# Patient Record
Sex: Female | Born: 1990 | Hispanic: No | Marital: Married | Smoking: Former smoker
Health system: Southern US, Community
[De-identification: ages and names within clinical notes are randomized; demographics above are authoritative.]

## PROBLEM LIST (undated history)

## (undated) ENCOUNTER — Inpatient Hospital Stay (HOSPITAL_COMMUNITY): Payer: Self-pay

## (undated) DIAGNOSIS — R55 Syncope and collapse: Secondary | ICD-10-CM

## (undated) DIAGNOSIS — B999 Unspecified infectious disease: Secondary | ICD-10-CM

## (undated) DIAGNOSIS — E559 Vitamin D deficiency, unspecified: Secondary | ICD-10-CM

## (undated) DIAGNOSIS — R519 Headache, unspecified: Secondary | ICD-10-CM

## (undated) DIAGNOSIS — I456 Pre-excitation syndrome: Secondary | ICD-10-CM

## (undated) DIAGNOSIS — A609 Anogenital herpesviral infection, unspecified: Secondary | ICD-10-CM

## (undated) DIAGNOSIS — L732 Hidradenitis suppurativa: Secondary | ICD-10-CM

## (undated) DIAGNOSIS — F419 Anxiety disorder, unspecified: Secondary | ICD-10-CM

## (undated) DIAGNOSIS — O139 Gestational [pregnancy-induced] hypertension without significant proteinuria, unspecified trimester: Secondary | ICD-10-CM

## (undated) DIAGNOSIS — R569 Unspecified convulsions: Secondary | ICD-10-CM

## (undated) DIAGNOSIS — R51 Headache: Secondary | ICD-10-CM

## (undated) HISTORY — PX: WISDOM TOOTH EXTRACTION: SHX21

## (undated) HISTORY — PX: TONSILLECTOMY: SUR1361

## (undated) HISTORY — DX: Gestational (pregnancy-induced) hypertension without significant proteinuria, unspecified trimester: O13.9

## (undated) HISTORY — DX: Vitamin D deficiency, unspecified: E55.9

## (undated) HISTORY — DX: Anogenital herpesviral infection, unspecified: A60.9

---
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
## 2008-01-22 ENCOUNTER — Ambulatory Visit: Payer: Self-pay | Admitting: Internal Medicine

## 2008-01-22 ENCOUNTER — Ambulatory Visit: Payer: Self-pay

## 2008-01-22 LAB — CONVERTED CEMR LAB
Calcium: 8.7 mg/dL (ref 8.4–10.5)
Creatinine, Ser: 0.6 mg/dL (ref 0.4–1.2)
Potassium: 3.5 meq/L (ref 3.5–5.1)
Sodium: 140 meq/L (ref 135–145)

## 2008-03-26 ENCOUNTER — Emergency Department (HOSPITAL_COMMUNITY): Admission: EM | Admit: 2008-03-26 | Discharge: 2008-03-26 | Payer: Self-pay | Admitting: Emergency Medicine

## 2008-04-14 ENCOUNTER — Ambulatory Visit: Payer: Self-pay | Admitting: Internal Medicine

## 2008-04-24 ENCOUNTER — Emergency Department (HOSPITAL_COMMUNITY): Admission: EM | Admit: 2008-04-24 | Discharge: 2008-04-25 | Payer: Self-pay | Admitting: Emergency Medicine

## 2008-09-20 ENCOUNTER — Emergency Department (HOSPITAL_COMMUNITY): Admission: EM | Admit: 2008-09-20 | Discharge: 2008-09-21 | Payer: Self-pay | Admitting: Emergency Medicine

## 2008-09-29 ENCOUNTER — Encounter (INDEPENDENT_AMBULATORY_CARE_PROVIDER_SITE_OTHER): Payer: Self-pay | Admitting: *Deleted

## 2009-01-21 DIAGNOSIS — I951 Orthostatic hypotension: Secondary | ICD-10-CM

## 2009-02-24 ENCOUNTER — Ambulatory Visit: Payer: Self-pay | Admitting: Internal Medicine

## 2009-03-23 ENCOUNTER — Telehealth (INDEPENDENT_AMBULATORY_CARE_PROVIDER_SITE_OTHER): Payer: Self-pay | Admitting: *Deleted

## 2009-05-19 ENCOUNTER — Emergency Department (HOSPITAL_COMMUNITY): Admission: EM | Admit: 2009-05-19 | Discharge: 2009-05-19 | Payer: Self-pay | Admitting: Emergency Medicine

## 2010-02-23 ENCOUNTER — Telehealth (INDEPENDENT_AMBULATORY_CARE_PROVIDER_SITE_OTHER): Payer: Self-pay | Admitting: *Deleted

## 2010-04-06 NOTE — Progress Notes (Signed)
   Faxed all Recent Cardiac over to [facility] in [place] to fax [phone] [facility]  March 23, 2009 8:23 AM

## 2010-04-08 NOTE — Progress Notes (Signed)
   Copied Pt's Records,Left Message on Cell [phone] for records pick-up. Crystal Newman  February 23, 2010 12:03 PM     Appended Document:  Pt picked up Records Today.

## 2010-05-30 LAB — CBC
MCHC: 32.7 g/dL (ref 30.0–36.0)
Platelets: 199 10*3/uL (ref 150–400)
RDW: 12.6 % (ref 11.5–15.5)

## 2010-05-30 LAB — DIFFERENTIAL
Basophils Relative: 1 % (ref 0–1)
Lymphs Abs: 2.6 10*3/uL (ref 0.7–4.0)
Monocytes Absolute: 0.7 10*3/uL (ref 0.1–1.0)
Monocytes Relative: 8 % (ref 3–12)
Neutro Abs: 5.3 10*3/uL (ref 1.7–7.7)
Neutrophils Relative %: 61 % (ref 43–77)

## 2010-05-30 LAB — URINALYSIS, ROUTINE W REFLEX MICROSCOPIC
Bilirubin Urine: NEGATIVE
Glucose, UA: NEGATIVE mg/dL
Hgb urine dipstick: NEGATIVE
Ketones, ur: NEGATIVE mg/dL
Protein, ur: NEGATIVE mg/dL
pH: 7.5 (ref 5.0–8.0)

## 2010-05-30 LAB — COMPREHENSIVE METABOLIC PANEL
ALT: 12 U/L (ref 0–35)
Albumin: 3.9 g/dL (ref 3.5–5.2)
Alkaline Phosphatase: 63 U/L (ref 39–117)
Calcium: 9.5 mg/dL (ref 8.4–10.5)
Glucose, Bld: 94 mg/dL (ref 70–99)
Potassium: 4 mEq/L (ref 3.5–5.1)
Sodium: 139 mEq/L (ref 135–145)
Total Protein: 6.6 g/dL (ref 6.0–8.3)

## 2010-05-30 LAB — POCT CARDIAC MARKERS
CKMB, poc: <1 ng/mL — ABNORMAL LOW (ref 1.0–8.0)
Myoglobin, poc: 17.9 ng/mL (ref 12–200)

## 2010-05-30 LAB — RAPID URINE DRUG SCREEN, HOSP PERFORMED
Amphetamines: NOT DETECTED
Benzodiazepines: NOT DETECTED
Cocaine: NOT DETECTED
Tetrahydrocannabinol: NOT DETECTED

## 2010-05-30 LAB — POCT PREGNANCY, URINE: Preg Test, Ur: NEGATIVE

## 2010-05-30 LAB — ETHANOL: Alcohol, Ethyl (B): <5 mg/dL (ref 0–10)

## 2010-06-13 LAB — CBC
Platelets: 186 10*3/uL (ref 150–400)
RBC: 3.93 MIL/uL (ref 3.87–5.11)
WBC: 9.9 10*3/uL (ref 4.0–10.5)

## 2010-06-13 LAB — RAPID URINE DRUG SCREEN, HOSP PERFORMED
Amphetamines: NOT DETECTED
Benzodiazepines: NOT DETECTED

## 2010-06-13 LAB — URINE CULTURE: Colony Count: >=100000

## 2010-06-13 LAB — ETHANOL: Alcohol, Ethyl (B): <5 mg/dL (ref 0–10)

## 2010-06-13 LAB — DIFFERENTIAL
Basophils Absolute: 0.1 10*3/uL (ref 0.0–0.1)
Basophils Relative: 1 % (ref 0–1)
Eosinophils Absolute: 0.1 10*3/uL (ref 0.0–0.7)
Eosinophils Relative: 1 % (ref 0–5)
Lymphocytes Relative: 27 % (ref 12–46)
Monocytes Absolute: 0.7 10*3/uL (ref 0.1–1.0)

## 2010-06-13 LAB — COMPREHENSIVE METABOLIC PANEL
ALT: 14 U/L (ref 0–35)
AST: 29 U/L (ref 0–37)
Albumin: 3.9 g/dL (ref 3.5–5.2)
Alkaline Phosphatase: 69 U/L (ref 39–117)
CO2: 25 mEq/L (ref 19–32)
Chloride: 108 mEq/L (ref 96–112)
GFR calc Af Amer: >60 mL/min (ref >60)
GFR calc non Af Amer: >60 mL/min (ref >60)
Potassium: 3.6 mEq/L (ref 3.5–5.1)
Sodium: 138 mEq/L (ref 135–145)
Total Bilirubin: 0.4 mg/dL (ref 0.3–1.2)

## 2010-06-13 LAB — URINE MICROSCOPIC-ADD ON

## 2010-06-13 LAB — POCT CARDIAC MARKERS
Myoglobin, poc: 59.8 ng/mL (ref 12–200)
Troponin i, poc: <0.05 ng/mL (ref 0.00–0.09)

## 2010-06-13 LAB — POCT PREGNANCY, URINE: Preg Test, Ur: NEGATIVE

## 2010-06-13 LAB — URINALYSIS, ROUTINE W REFLEX MICROSCOPIC
Bilirubin Urine: NEGATIVE
Ketones, ur: NEGATIVE mg/dL
Specific Gravity, Urine: 1.017 (ref 1.005–1.030)
Urobilinogen, UA: 0.2 mg/dL (ref 0.0–1.0)

## 2010-06-22 LAB — GLUCOSE, CAPILLARY

## 2010-06-22 LAB — POCT PREGNANCY, URINE: Preg Test, Ur: NEGATIVE

## 2010-07-20 NOTE — Discharge Summary (Signed)
[name], Crystal Newman             ACCOUNT NO.:  [id number]   MEDICAL RECORD NO.:  [id number]          PATIENT TYPE:  INP   LOCATION:  4738                         FACILITY:  [facility]   PHYSICIAN:  Duke Salvia, MD, FACCDATE OF BIRTH:  [birth year]   DATE OF ADMISSION:  04/09/2007  DATE OF DISCHARGE:  04/10/2007                               DISCHARGE SUMMARY   PRIMARY CARDIOLOGIST:  Duke Salvia, MD.   PRIMARY CARE PHYSICIAN:  None listed at this time.  The patient has been  seen by the [facility].   DISCHARGING DIAGNOSIS:  Neurocardiogenic syncope.   PAST MEDICAL HISTORY INCLUDES:  1. Asthma.  2. A history of syncopal episode in 2008.   HISTORY OF PRESENT ILLNESS:  A 20 year old Caucasian female who began  having syncopal episode approximately 2 years ago, preceded by  dizziness, occasional left arm tingling associated with fatigue and  occasional palpitations.  Presented to [facility] on day  of admission.  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  She went to the nurse's station and found to  have a low blood pressure, and was sent to [facility] for further  evaluation.   HOSPITAL COURSE:  Here, she was found to have an H&H of 13.3 and 39.1,  WBC 10.1, platelets 303,000.  Sodium 139, potassium 4, BUN and  creatinine 5 and 0.6 with a glucose of 85.  TSH pending at the time of  discharge.  Dr. Graciela Husbands has examined patient, and recommends increased  sodium and fluid intake, decrease caffeine, no alcohol intake.  The  patient is being discharged home with instructions to followup with Dr.  Graciela Husbands.  An outpatient appointment has been scheduled for February 23.  The patient has no restrictions in activity at this time including  driving per Dr. Graciela Husbands.   At the time of discharge patient afebrile, blood pressure 115/68, heart  rate 70.  Urine pregnancy negative.  A 12-lead EKG showed sinus brady  with sinus arrhythmia at a rate a 55 on day of discharge.   DURATION OF DISCHARGE ENCOUNTER:  Less than 30 minutes.      Dorian Pod, ACNP      Duke Salvia, MD, [facility]  Electronically Signed    MB/MEDQ  D:  04/10/2007  T:  04/11/2007  Job:  [id number]   cc:   [facility]

## 2010-07-20 NOTE — Assessment & Plan Note (Signed)
[facility]                         ELECTROPHYSIOLOGY OFFICE NOTE   NAME:Crystal Newman                    MRN:          [id number]  DATE:05/09/2007                            DOB:          Jun 07, 1990    Crystal Newman is seen following a recent hospitalization for recurrent  syncope. 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  She is much, much better on increased salt and water  intake.  She says that the test is upcoming, and I asked her what she  meant, and she meant her period which is scheduled later this week.   I should note that her urine is relatively clear.  She has eliminated  the caffeine.  She is having no orthostatic intolerance.   PHYSICAL EXAMINATION:  VITAL SIGNS:  Blood pressure is 122/63.  LUNGS:  Clear.  HEART:  Sounds were regular.  EXTREMITIES:  Without edema.   She is to continue on her current course, and will plan to see her again  in three months' time.     Duke Salvia, MD, [facility]  Electronically Signed    SCK/MedQ  DD: 05/09/2007  DT: 05/10/2007  Job #: [id number]   cc:   Silas Sacramento, M.D.

## 2010-07-20 NOTE — Assessment & Plan Note (Signed)
[facility]                         ELECTROPHYSIOLOGY OFFICE NOTE   NAME:Crystal Newman, Crystal Newman                    MRN:          [id number]  DATE:04/14/2008                            DOB:          1990/09/21    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  She has  very little prodrome on some occasions, but when she does it is quite  stereotypical.  Her recovery phase is notable for being flushed,  diaphoretic, extremely pale, residual orthostatic intolerance, and  profound residual fatigue.   These episodes have occurred,  A.  In the context of being in hospital.  B.  Early in the morning.  C.  In the setting of atypical chest pain and as noted, they  unfortunately have been not infrequently without much warning.   Her evaluation here today included urine sodiums taken in the fall where  her 24-hour total was 163 mEq/L.  We had tried her on Florinef  complicated by headaches and swelling.  We then put her on ProAmatine  over the last month she has been identified with having systolic and  diastolic hypertension in the range of 150 to 160/100.   She raises the question also as to whether Yasmin, which is currently  being recalled from the market could have any contribution.   On examination today, her blood pressure is 100/70, her weight was 168,  which is stable.  Her lungs were clear.  Her heart sounds were regular.  The abdomen was soft and extremities were without edema.   Electrocardiogram was normal with intervals of 0.11/0.07/0.36, the short  PR was stable.   IMPRESSION:  1. Recurrent syncope.  2. Dysautonomia.  3. Elevated blood pressure, likely as a consequence of ProAmatine.   We have discussed different treatment options including non-medicinal  support stockings as well as SSRI therapy.  She has elected to pursue  both with the idea that  medicinal therapy would be tried for a year and  if the symptoms abated, we try to wean off at that time.  We also  discussed the role of Prozac and she preferred Paxil especially given  the common anxiety.   We will see her again in 3 month's time.    Duke Salvia, MD, [facility]  Electronically Signed   SCK/MedQ  DD: 04/14/2008  DT: 04/15/2008  Job #: [id number]

## 2010-07-20 NOTE — Consult Note (Signed)
NAMEHILJA, KINTZEL             ACCOUNT NO.:  [id number]   MEDICAL RECORD NO.:  [id number]          PATIENT TYPE:  INP   LOCATION:  4738                         FACILITY:  [facility]   PHYSICIAN:  Duke Salvia, MD, FACCDATE OF BIRTH:  Feb 14, 1991   DATE OF CONSULTATION:  04/10/2007  DATE OF DISCHARGE:  04/10/2007                                 CONSULTATION   [name] seen at the request of Dr. Jens Som and Dr. Neva Seat for  recurrent syncope.   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  These episodes  are quite stereotypical.  They are accompanied by a sense that things  are moving away both in the terms of sight as well as hearing, and they  are accompanied by significant diaphoresis but without nausea or warmth.  They are associated with significant residual orthostatic intolerance,  profound residual fatigue, and they frequently occur around the time of  her menses.  She has also had episodes where she has lost consciousness  in the shower.  She has orthostatic tachycardia as well.   Her diet is modestly salt-deplete.  She drinks a modest amount of  caffeine.  Her fluid status is somewhat deplete as well.   Her episodes are accompanied by some palpitations, which she describes  as a fluttering, and then settling down.   On the episode that she had the other day at work, she was taken to the  nursing station where her heart rate was apparently relatively normal  but her blood pressure was recorded in the 60s.  She was described as  being white as a ghost.   She is all that fit, she did play softball in high school, but she has  no problems with exercise tolerance.   She takes no medications.   SOCIAL HISTORY:  She does not use cigarettes, alcohol or recreational  drugs.   She has no known drug allergies.   REVIEW OF SYSTEMS:  Noncontributory.   EXAMINATION:  She is a  healthy-appearing 20 year old girl.  Blood pressure was 105/63 with a pulse of 74.  HEENT:  No icterus or xanthoma.  The neck veins were flat.  Carotids were brisk and full bilaterally  without bruits.  BACK:  Without kyphosis or scoliosis.  LUNGS:  Clear.  Heart sounds were regular.  ABDOMEN:  Soft with active bowel sounds, without midline pulsation or  hepatomegaly.  Femoral pulses were not examined.  Distal pulses were intact.  There is no clubbing, cyanosis or edema.  NEUROLOGIC:  Grossly normal.  SKIN:  Warm and dry.   Electrocardiogram dated yesterday demonstrated sinus rhythm at 60 with  intervals 0.11/0.86/0.38.  There was a short PR interval but there was  no evidence of ventricular pre-excitation with a little tiny Q-wave-like  thing in lead V4, supportive of the idea of the absence of a delta wave.   IMPRESSION:  1. Recurrent syncope, almost certainly neurally-mediated.  2. Palpitations associated with #1.  3. Abnormal electrocardiogram manifested by short a PR  interval      consistent with Lown-Ganong-Levine syndrome.   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  I have  reviewed the above diagnosis with her parents and the patient  extensively, describing both the physiology, pathophysiology and the  therapeutic interventions, including salt, water and then potentially  things like Florinef, Proamatine and consideration of beta blocker/SSRI.  They understand these risks.  She is also admonished to be aware of her  prodrome and may become recumbent at that time, to be aware that there  is significant residual orthostatic intolerance and that while she is  standing, we described numerous maneuvers to try to augment venous  return.   Thank you for the consultation.      Duke Salvia, MD, [facility]  Electronically Signed     SCK/MEDQ  D:  04/10/2007  T:  04/11/2007  Job:  [id number]   cc:   Shade Flood, M.D.

## 2010-07-20 NOTE — H&P (Signed)
NAMELAUREEN, Crystal Newman             ACCOUNT NO.:  [id number]   MEDICAL RECORD NO.:  [id number]          PATIENT TYPE:  INP   LOCATION:  4738                         FACILITY:  [facility]   PHYSICIAN:  Crystal Frieze. Jens Som, MD, FACCDATE OF BIRTH:  [birth year]   DATE OF ADMISSION:  04/09/2007  DATE OF DISCHARGE:                              HISTORY & PHYSICAL   [name] is a 20 year old female with no prior cardiac history, who  is being admitted with syncope.  Note she typically does not have  dyspnea on exertion, orthopnea, PND, pedal edema, exertional chest pain.  She does state that for the past 1 year, she has noticed occasional  palpitations.  These are described as a brief flutter but there is no  associated symptoms.  Over the past 2 years, she has had multiple  syncopal episodes.  These occur suddenly without warning.  They can  occur either with exertion or at rest.  She states she will begin to  sweat, and her hands shake.  There is occasional tingling in the left  upper extremity.  She then passes out.  There is no associated chest  pain, nausea/vomiting, shortness of breath, seizure activity,  incontinence, loss of strength and sensation in extremities or  dysarthria.  She is out for 1 to 2 minutes and then wakes up.  She has  not injured herself.  She feels somewhat fatigued afterwards.  Note,  this is not occur after bowel movements or after urination.  She also  does denies any orthostatic symptoms.  She was seen in the emergency  room here in March 2008, for those episodes.  She also was seen in  Urgent Care earlier today for an episode.  She had a CBC checked that  showed a white blood cell count of 8.3, with a hemoglobin 11.9,  hematocrit 37.6.  Her platelet count was 266.  She had an  electrocardiogram as well, that showed a normal sinus rhythm at a rate  of 68.  The axis was normal.  There was sinus arrhythmia.  The QT was  not prolonged.  There was right atrial  enlargement.  The PR interval was  short.  Dr. Graciela Husbands was contacted and was to contact the patient for an  outpatient evaluation.  However, she had another episode and came to the  emergency room.  She presently is asymptomatic.  She has no known drug  allergies.  Her medications include birth control pills and iron.   PAST MEDICAL HISTORY:  There is no diabetes mellitus, hypertension,  hyperlipidemia.  She has had previous wisdom teeth removed, but there is  no other surgeries noted.   SOCIAL HISTORY:  She denies any alcohol, tobacco or drug use.   FAMILY HISTORY:  Is negative for coronary artery disease artery disease  or sudden death.   REVIEW OF SYSTEMS:  She denies any headaches or fevers, chills.  No  productive cough or hemoptysis.  There is no dysphagia or odynophagia.  She has had dark stools but is on iron.  There is no dysuria or  hematuria.  There is no  rash or seizure activity.  There is no  orthopnea, PND or pedal edema.  She is presently menstruating.  Remaining systems are negative.   PHYSICAL EXAMINATION:  VITAL SIGNS:  Her physical exam today shows a  blood pressure 112/83 and her temperature is 90.3.  Her pulse is 87.  In  the Urgent Care, orthostatics were obtained.  In the lying position, she  was 115/71 with a pulse 78.  In the standing position, she was 129/84,  with a pulse of 104.  She is well-developed and well-nourished, in no  acute distress.  SKIN:  Warm and dry.  She does not appear to be depressed, and there is  no peripheral clubbing.  BACK:  Normal.  HEENT:  Normal with normal eyelids.  Her neck is supple with a normal  upstroke bilaterally.  I can not appreciate bruits.  There is no jugular  distention and no thyromegaly noted.  CHEST:  Clear to auscultation, no expansion.  CARDIOVASCULAR:  Regular rhythm, normal S1 and S2.  There are no  murmurs, rubs or gallops noted.  There is no change with Valsalva.  ABDOMEN:  Nontender, nondistended,  positive bowel sounds, no  hepatosplenomegaly.  No mass appreciated.  There is no abdominal bruit.  She has 2+ femoral pulses bilaterally.  No bruits.  EXTREMITIES:  Show no edema that I can palpate.  No cords.  She has 2+  posterior tibial pulses bilaterally.  NEUROLOGIC:  Exam shows cranial nerves II-XII grossly intact.  There is  no loss of strength or sensation in extremities.  Her electrocardiogram  is described above.  She has had [facility] that was negative.   DIAGNOSES:  Syncope - etiology.  This is unclear at present.  Given that  she is describing episodes of diaphoresis prior to the event, this  certainly could be vasovagal.  However, electrocardiogram also shows a  short PR-interval with no delta wave.  We must also therefore consider  Lown-Gang-Levine syndrome.  Dr. Graciela Husbands is to evaluate the patient  tomorrow morning.  For now, she will be admitted to telemetry, and we  will follow her rhythm.  We will check an echocardiogram to quantify LV  function.  She will most likely need an outpatient CardioNet monitor, if  we cannot identify an issue in the hospital.  Note, we will check  baseline laboratories, electrolytes.  We will also check a TSH.      Crystal Frieze Jens Som, MD, [facility]  Electronically Signed     BSC/MEDQ  D:  04/09/2007  T:  04/10/2007  Job:  [id number]

## 2010-07-20 NOTE — Group Therapy Note (Signed)
Crystal Newman NO.:  [id number]   MEDICAL RECORD NO.:  [id number]          PATIENT TYPE:  WOC   LOCATION:  [facility]                   FACILITY:  [facility]   PHYSICIAN:  Elsie Lincoln, MD      DATE OF BIRTH:  Feb 23, 1991   DATE OF SERVICE:                                  CLINIC NOTE   CHIEF COMPLAINT:  Pap smear and birth control.   HISTORY OF PRESENT ILLNESS:  Crystal Newman is an 20 year old gravida 0 white  female who presents for her first pelvic examination and desires birth  control. She reports no problems with her period and is not currently  using condoms for birth control.   MENSTRUAL HISTORY:  Menarche at the age of 3. Her period has medium  flow and lasts about 5 days. She denies any menstrual cramps and no  bleeding between periods.   OBSTETRIC HISTORY:  The patient is G0.   GYNECOLOGIC HISTORY:  The patient has never had any pap smears.   SURGICAL HISTORY:  None.   MEDICAL HISTORY:  None.   MEDICATIONS:  None.   ALLERGIES:  None.   FAMILY HISTORY:  The patient's grandparents have a history of diabetes,  heart disease, and high blood pressure. The patient's grandmother and  grandfather have pancreatic and HEENT cancers. There is no other  significant diseases or health problems in her family.   PAST MEDICAL HISTORY:  Significant for asthma.   SOCIAL HISTORY:  The patient lives with her mother, father, and sister.  She does not smoke and does not drink alcohol. She has been sexually  active since the age of 78 and reports that she does not believe that  she is at high risk for any sexually transmitted diseases.   PHYSICAL EXAMINATION:  VITAL SIGNS:  Temperature 98.1, pulse 88, blood  pressure 116/81, weight 164.5.  GENERAL:  The patient is in no acute distress and is pleasant and  cooperative throughout the entire exam.  NECK:  Supple without mass. No thyromegaly and no lymphadenopathy.  HEART:  Regular rate and rhythm without murmur.  LUNGS:  Clear to auscultation bilaterally.  GENITOURINARY:  Normal female genitalia with pink vaginal mucosa and  rugae present. Cervical os is normal. Squamocolumnar junction is not  visualized. GC probe was obtained.   IMPRESSION:  This is an 20 year old here with request for birth control.   PLAN:  Pap smear was not performed today as it has not been three years  since the patient's first sexual encounter. We will plan to do a pap  smear in one year. Gonorrhea probe was obtained and the patient was  consented for HIV test. She was given a sample of Yazmin for birth  control after discussing at length the many birth control options. She  was also given a prescription with 11 refills and will return to clinic  in one year for a routine screening pap smear.     ______________________________  Sylvan Cheese, Dr.    ______________________________  Elsie Lincoln, MD    MJ/MEDQ  D:  09/07/2006  T:  09/08/2006  Job:  [id number]

## 2010-07-20 NOTE — Assessment & Plan Note (Signed)
[facility]                            CARDIOLOGY OFFICE NOTE   NAME:[name], MIELA DESJARDIN                    MRN:          [id number]  DATE:12/26/2007                            DOB:          Feb 01, 1991    Kathreen Cornfield comes in again having been seen last in March because of  symptoms of syncope that were consistent with an neurally mediated  syndrome.  She had been doing better at that time with salt and water  repletion.  She had an episode of syncope in July and then had a cluster  of episodes of syncope in September that occurred following resumption  of caffeinated beverages.  It also occurred concurrent with her period.   What happened next that prompted this reevaluation was that earlier in  October, she went to work, not feeling well.  This had persisted for  some time.  She had some sinus symptoms.  She had some GI symptoms, and  she was walking in and out, talking with the speech therapist, returned  with some towels and the next thing she knew, she was awakened on the  floor.  We unfortunately do not have records.  She was then seen in the  emergency room, and we have no records except to say that she was  thought to have a urinary tract infection and was treated with Bactrim.   She has continued to have recurrent shower syncope particularly with  hotter showers.  She is now taking warm.  She has had problems with  dizziness upon standing.   Her medications include iron and vitamin C.   PHYSICAL EXAMINATION:  GENERAL:  She is healthy appearing.  VITAL SIGNS:  Her blood pressure is 114/74 without significant  orthostatic change.  The pulse rate increased from 83 to 96.  LUNGS:  Clear.  HEART:  Sounds were regular.  There is no murmurs, rubs, or gallops.  ABDOMEN:  Soft.  EXTREMITIES:  Without edema.   Electrocardiogram dated today demonstrated sinus rhythm at 77 with  intervals of 0.11/0.08/0.36.   IMPRESSION:  1. 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  2. Short PR.  3. Most recent episode of syncope is associated with a heart rate of      150.  Data unavailable from EMS.   We will plan to try and get the records from EMS on December 13, 2007, of  transport from [facility] to [place].   We will plan to check her urine sodium excretion to see whether she  needs further sodium supplementation.  In the event that is okay, I will  consider beginning her on Florinef, although I have asked and her mother  to decide whether trying to proceed with aggressive nonmedicinal therapy  and avoiding provocative situations like diuresis and providing diuresis  and such would be preferable.   We will talk to her following the aforementioned testing.     Duke Salvia, MD, [facility]  Electronically Signed    SCK/MedQ  DD: 12/26/2007  DT: 12/27/2007  Job #: [id number]   cc:   Asencion Partridge  Neva Seat, M.D.

## 2010-11-26 LAB — CBC
Platelets: 303
RDW: 13.1

## 2010-11-26 LAB — MAGNESIUM: Magnesium: 2.1

## 2010-11-26 LAB — COMPREHENSIVE METABOLIC PANEL
ALT: 10
Alkaline Phosphatase: 59
BUN: 5 — ABNORMAL LOW
CO2: 30
GFR calc non Af Amer: >60
Glucose, Bld: 85
Potassium: 4
Sodium: 139
Total Bilirubin: 0.6

## 2010-11-26 LAB — TSH: TSH: 1.464

## 2010-12-06 LAB — CBC
HCT: 38.5
Hemoglobin: 13.2
MCHC: 34.2
MCV: 92.3
Platelets: 193
RBC: 4.17
RDW: 13.4
WBC: 7.2

## 2010-12-06 LAB — URINALYSIS, ROUTINE W REFLEX MICROSCOPIC
Bilirubin Urine: NEGATIVE
Glucose, UA: NEGATIVE
Hgb urine dipstick: NEGATIVE
Ketones, ur: NEGATIVE
Nitrite: NEGATIVE
Protein, ur: NEGATIVE
Specific Gravity, Urine: 1.014
Urobilinogen, UA: 0.2
pH: 6

## 2010-12-06 LAB — POCT I-STAT, CHEM 8
BUN: 5 — ABNORMAL LOW
Calcium, Ion: 1.11 — ABNORMAL LOW
Chloride: 103
Creatinine, Ser: 0.9
Glucose, Bld: 80
HCT: 40
Hemoglobin: 13.6
Potassium: 3.8
Sodium: 138
TCO2: 25

## 2010-12-06 LAB — URINE CULTURE
Colony Count: NO GROWTH
Culture: NO GROWTH

## 2010-12-06 LAB — DIFFERENTIAL
Basophils Absolute: 0
Basophils Relative: 0
Eosinophils Absolute: 0
Eosinophils Relative: 0
Lymphocytes Relative: 11 — ABNORMAL LOW
Lymphs Abs: 0.8
Monocytes Absolute: 0.8
Monocytes Relative: 12
Neutro Abs: 5.5
Neutrophils Relative %: 77

## 2010-12-06 LAB — URINE MICROSCOPIC-ADD ON

## 2010-12-06 LAB — POCT PREGNANCY, URINE: Preg Test, Ur: NEGATIVE

## 2011-02-12 ENCOUNTER — Emergency Department (HOSPITAL_COMMUNITY)
Admission: EM | Admit: 2011-02-12 | Discharge: 2011-02-12 | Disposition: A | Discharge status: Home / Self Care | Payer: No Typology Code available for payment source | Attending: Emergency Medicine | Admitting: Emergency Medicine

## 2011-02-12 ENCOUNTER — Emergency Department (HOSPITAL_COMMUNITY): Payer: No Typology Code available for payment source

## 2011-02-12 ENCOUNTER — Other Ambulatory Visit: Payer: Self-pay

## 2011-02-12 ENCOUNTER — Encounter: Payer: Self-pay | Admitting: Emergency Medicine

## 2011-02-12 DIAGNOSIS — R0789 Other chest pain: Secondary | ICD-10-CM

## 2011-02-12 DIAGNOSIS — R071 Chest pain on breathing: Secondary | ICD-10-CM | POA: Insufficient documentation

## 2011-02-12 DIAGNOSIS — F17200 Nicotine dependence, unspecified, uncomplicated: Secondary | ICD-10-CM | POA: Insufficient documentation

## 2011-02-12 DIAGNOSIS — R55 Syncope and collapse: Secondary | ICD-10-CM | POA: Insufficient documentation

## 2011-02-12 DIAGNOSIS — J45909 Unspecified asthma, uncomplicated: Secondary | ICD-10-CM | POA: Insufficient documentation

## 2011-02-12 HISTORY — DX: Anxiety disorder, unspecified: F41.9

## 2011-02-12 HISTORY — DX: Syncope and collapse: R55

## 2011-02-12 HISTORY — DX: Pre-excitation syndrome: I45.6

## 2011-02-12 HISTORY — DX: Unspecified convulsions: R56.9

## 2011-02-12 LAB — POCT I-STAT, CHEM 8
Calcium, Ion: 1.2 mmol/L (ref 1.12–1.32)
Creatinine, Ser: 0.8 mg/dL (ref 0.50–1.10)
Glucose, Bld: 159 mg/dL — ABNORMAL HIGH (ref 70–99)
Hemoglobin: 14.3 g/dL (ref 12.0–15.0)
TCO2: 26 mmol/L (ref 0–100)

## 2011-02-12 LAB — URINALYSIS, ROUTINE W REFLEX MICROSCOPIC
Glucose, UA: NEGATIVE mg/dL
Protein, ur: NEGATIVE mg/dL
Urobilinogen, UA: 0.2 mg/dL (ref 0.0–1.0)

## 2011-02-12 LAB — PREGNANCY, URINE: Preg Test, Ur: NEGATIVE

## 2011-02-12 MED ORDER — NAPROXEN 250 MG PO TABS: 250.0000 mg | ORAL_TABLET | Freq: Two times a day (BID) | ORAL | Status: AC

## 2011-02-12 NOTE — ED Notes (Signed)
Have informed phlebotomy of need to i-stat troponin and chem 8.  Lab staff will draw now.

## 2011-02-12 NOTE — ED Notes (Signed)
Patient arrived by POV w/ c/o left lower and lateral rib pain that has been going on for a while with what she describes as knots and that one feels like it has gotten larger and is causing further pain.  While in the waiting to be triaged, patient fell from standing position in an apparent seizure.  Upon arrival to the room, patient is NOT post ictal, able to answer all questions appropriately, moves all extremities w/o difficulty.  A/O x's 4.  No loss of bowel or bladder, NSR on bedside monitor, O2 sats 100% on room air.  Denies hitting head when she fell in the lobby.

## 2011-02-12 NOTE — ED Provider Notes (Signed)
History     CSN: [id number] Arrival date & time: 02/12/2011  3:42 PM   Chief Complaint  Patient presents with  . Chest Pain  . Seizures   HPI Pt was seen at 1715.  Per pt, c/o gradual onset and persistence of constant left lower anterior-lateral ribs "lumps" that she has had for the past 3-5 years.  States she "thinking one might have gotten bigger" and "hurts" for the past several weeks.  Denies palpitations, no SOB/cough, no fevers, no back pain, no abd pain, no N/V/D, no rash.   Past Medical History  Diagnosis Date  . Seizures   . Syncope     neurally-mediated  . Anxiety   . Asthma   . Short PR-normal QRS complex syndrome     Past Surgical History  Procedure Date  . Wisdom tooth extraction   . Tonsillectomy     History  Substance Use Topics  . Smoking status: Current Everyday Smoker -- 0.5 packs/day  . Smokeless tobacco: Not on file  . Alcohol Use: No    Review of Systems ROS: Statement: All systems negative except as marked or noted in the HPI; Constitutional: Negative for fever and chills. ; ; Eyes: Negative for eye pain, redness and discharge. ; ; ENMT: Negative for ear pain, hoarseness, nasal congestion, sinus pressure and sore throat. ; ; Cardiovascular: Negative for chest pain, palpitations, diaphoresis, dyspnea and peripheral edema. ; ; Respiratory: Negative for cough, wheezing and stridor. ; ; Gastrointestinal: Negative for nausea, vomiting, diarrhea, abdominal pain, blood in stool, hematemesis, jaundice and rectal bleeding. . ; ; Genitourinary: Negative for dysuria, flank pain and hematuria. ; ; Musculoskeletal: Negative for back pain and neck pain. Negative for swelling and trauma.; ; Skin: Negative for pruritus, rash, abrasions, blisters, bruising and +skin lesion.; ; Neuro: Negative for headache, lightheadedness and neck stiffness. Negative for weakness, altered level of consciousness , altered mental status, extremity weakness, paresthesias, involuntary movement,  seizure and +syncope.     Allergies  Review of patient's allergies indicates no known allergies.  Home Medications   Current Outpatient Rx  Name Route Sig Dispense Refill  . BUTALBITAL-APAP-CAFF-COD 50-325-40-30 MG PO CAPS Oral Take 1 capsule by mouth every 4 (four) hours as needed.      Marland Kitchen DOXYCYCLINE HYCLATE 100 MG PO CPEP Oral Take 100 mg by mouth daily.      Marland Kitchen ESTRADIOL 0.5 MG PO TABS Oral Take 0.5 mg by mouth daily.      Marland Kitchen FLUOXETINE HCL 20 MG PO CAPS Oral Take 20 mg by mouth daily.      . IBUPROFEN 800 MG PO TABS Oral Take 800 mg by mouth every 8 (eight) hours as needed.      Marland Kitchen MECLIZINE HCL 12.5 MG PO TABS Oral Take 12.5 mg by mouth 3 (three) times daily as needed.        BP 105/74  Pulse 86  Temp(Src) 98.5 F (36.9 C) (Oral)  Resp 20  SpO2 100%  Physical Exam 1720: Physical examination:  Nursing notes reviewed; Vital signs and O2 SAT reviewed;  Constitutional: Well developed, Well nourished, Well hydrated, In no acute distress; Head:  Normocephalic, atraumatic; Eyes: EOMI, PERRL, No scleral icterus; ENMT: Mouth and pharynx normal, Mucous membranes moist; Neck: Supple, Full range of motion, No lymphadenopathy; Cardiovascular: Regular rate and rhythm, No murmur, rub, or gallop; Respiratory: Breath sounds clear & equal bilaterally, No rales, rhonchi, wheezes, or rub, Normal respiratory effort/excursion; Chest: +TTP left lower ant-lat ribs.  No  rash, no abscess, no fluctuance.  Movement normal; Abdomen: Soft, Nontender, Nondistended, Normal bowel sounds; Genitourinary: No CVA tenderness; Spine:  No midline CS, TS, LS tenderness. Extremities: Pulses normal, No tenderness, No edema, No calf edema or asymmetry.; Neuro: AA&Ox3, Major CN grossly intact.  No gross focal motor or sensory deficits in extremities.; Skin: Color normal, Warm, Dry, no rash.    ED Course  Procedures   1725:  Apparently on arrival to Triage, pt "fell down" and "might have had a seizure" per Triage staff.  No  post-ictal confusion or lethargy, no incont of bowel or bladder, no apparent injuries.  Pt's boyfriend states that pt "usually extends her arm out before she falls and passes out so she doesn't hit her head."  EPIC and Echart review reveals pt has a several year hx of recurrent syncopal episodes with extensive workup.   MDM  MDM Reviewed: nursing note, vitals and previous chart Reviewed previous: ECG Interpretation: ECG, labs, CT scan and x-ray    Date: 02/12/2011  Rate: 95  Rhythm: normal sinus rhythm  QRS Axis: normal  Intervals: PR shortened  ST/T Wave abnormalities: normal  Conduction Disutrbances:none  Narrative Interpretation:   Old EKG Reviewed: unchanged; no significant changes from previous EKG dated 05/19/2009.  Results for orders placed during the hospital encounter of 02/12/11  URINALYSIS, ROUTINE W REFLEX MICROSCOPIC      Component Value Range   Color, Urine YELLOW  YELLOW    APPearance CLEAR  CLEAR    Specific Gravity, Urine 1.009  1.005 - 1.030    pH 6.0  5.0 - 8.0    Glucose, UA NEGATIVE  NEGATIVE (mg/dL)   Hgb urine dipstick NEGATIVE  NEGATIVE    Bilirubin Urine NEGATIVE  NEGATIVE    Ketones, ur NEGATIVE  NEGATIVE (mg/dL)   Protein, ur NEGATIVE  NEGATIVE (mg/dL)   Urobilinogen, UA 0.2  0.0 - 1.0 (mg/dL)   Nitrite NEGATIVE  NEGATIVE    Leukocytes, UA MODERATE (*) NEGATIVE   PREGNANCY, URINE      Component Value Range   Preg Test, Ur NEGATIVE    URINE MICROSCOPIC-ADD ON      Component Value Range   Squamous Epithelial / LPF RARE  RARE    WBC, UA 3-6  <3 (WBC/hpf)  POCT I-STAT, CHEM 8      Component Value Range   Sodium 142  135 - 145 (mEq/L)   Potassium 4.0  3.5 - 5.1 (mEq/L)   Chloride 105  96 - 112 (mEq/L)   BUN 7  6 - 23 (mg/dL)   Creatinine, Ser 4.09  0.50 - 1.10 (mg/dL)   Glucose, Bld 811 (*) 70 - 99 (mg/dL)   Calcium, Ion 9.14  1.12 - 1.32 (mmol/L)   TCO2 26  0 - 100 (mmol/L)   Hemoglobin 14.3  12.0 - 15.0 (g/dL)   HCT 78.2  95.6 - 21.3 (%)    POCT I-STAT TROPONIN I      Component Value Range   Troponin i, poc 0.00  0.00 - 0.08 (ng/mL)   Comment 3            Dg Ribs Unilateral W/chest Left  02/12/2011  *RADIOLOGY REPORT*  Clinical Data: Left-sided chest pain.  "Knot" on the left ribs.  LEFT RIBS AND CHEST - 3+ VIEW 02/12/2011:  Comparison: No prior left rib imaging.  Two-view chest x-ray 09/21/2008, 04/09/2007 [facility] and 12/13/2007 [facility].  Findings: No fractures identified involving the ribs.  No  intrinsic osseous abnormalities.  Cardiomediastinal silhouette unremarkable.  Lungs clear. Bronchovascular markings normal.  No pneumothorax.  No visible pleural effusions.  No significant interval change in the appearance of the chest.  IMPRESSION:  1.  Normal left ribs. 2.  No acute cardiopulmonary disease.  Original Report Authenticated By: Arnell Sieving, M.D.   Ct Head Wo Contrast  02/12/2011  *RADIOLOGY REPORT*  Clinical Data: Fall.  Seizure.  CT HEAD WITHOUT CONTRAST  Technique:  Contiguous axial images were obtained from the base of the skull through the vertex without contrast.  Comparison: CT head without contrast 10/01/2008.  Findings: No acute infarct, hemorrhage, or mass lesion is present. The ventricles are of normal size.  No significant extra-axial fluid collection is present.  The paranasal sinuses and mastoid air cells are clear.  The osseous skull is intact.  No significant extracranial soft tissue injury is evident.  IMPRESSION: Negative CT of the head.  Original Report Authenticated By: Jamesetta Orleans. MATTERN, M.D.    8:19 PM:  On exam, pt is palpating her ribs c/o "pain" and "lumps" that she has had for "a really really long time."  There is no rash, soft tissue crepitus, erythema, ecchymosis, fluctuance or visible abnormality to her skin.  Doubt acute infection or abscess.  Abd soft/NT, resps easy, VSS.  Syncope today was per pt's norm for the past several years.  Not hypoxic or tachycardic, no  SOB.  Doubt PE or ACS as cause for symptoms today.  Pt wants to go home now.  Dx testing d/w pt and family.  Questions answered.  Verb understanding, agreeable to d/c home with outpt f/u.       Christion Leonhard Allison Quarry, DO 02/14/11 1651

## 2011-02-12 NOTE — Discharge Instructions (Signed)
RESOURCE GUIDE  Dental Problems  Patients with Medicaid: [facility] [phone] [address]                                           [phone] Phone:  [phone]                                                  Phone:  [phone]  If unable to pay or uninsured, contact:  [facility] or [facility]. to become qualified for the adult dental clinic.  Chronic Pain Problems Contact [facility]  [phone] Patients need to be referred by their primary care doctor.  Insufficient Money for Medicine Contact [employer]:  call "211" or [facility] [phone].  No Primary Care Doctor Call [facility]  [phone] Other agencies that provide inexpensive medical care    [facility]  [phone]    [facility]  [phone]    [facility]  [phone]    [facility]  [phone]    [facility]  [phone]    [facility]  [phone]  Psychological Services [facility]  [phone] [facility] Services  [phone] [facility]   [phone] (emergency services [phone])  Substance Abuse Resources [facility]  [phone] [facility] [phone] [facility] [phone] Floydene Flock [phone] Residential & Outpatient Substance Abuse Program  [phone]  Abuse/Neglect [facility] Child Abuse Hotline 731-136-2890 [facility] Child Abuse Hotline (445) 855-8967 (After Hours)  Emergency Shelter [facility] [phone]  Maternity Homes Room at the [place] [facility] [place] 6303210845 [facility] [phone]  MRSA Hotline #:   (570)165-1801    [facility] Resources  [facility]     [employer]                          [facility] Dept. [address] [place]                       [address]      [address]  Blondell Reveal Phone:  [phone]                                   Phone:  [phone]                 Phone:  [phone]  [facility] Phone:  [phone]  [facility] Child Abuse Hotline [phone] (After Hours)   Take the prescription as directed.  Apply moist heat or ice to the area(s) of discomfort, for 15 minutes at a time, several times per day for the next few days.  Do not fall asleep on a heating or ice pack.  Call your regular medical doctor on Monday to schedule a follow up appointment this week.  Return to the Emergency Department immediately if worsening.

## 2011-07-21 ENCOUNTER — Encounter: Payer: Self-pay | Admitting: Obstetrics and Gynecology

## 2011-07-21 ENCOUNTER — Ambulatory Visit (INDEPENDENT_AMBULATORY_CARE_PROVIDER_SITE_OTHER): Payer: Self-pay | Admitting: Obstetrics and Gynecology

## 2011-07-21 VITALS — BP 100/70 | Temp 98.3°F | Resp 14 | Ht 67.0 in | Wt 163.0 lb

## 2011-07-21 DIAGNOSIS — E663 Overweight: Secondary | ICD-10-CM

## 2011-07-21 DIAGNOSIS — L732 Hidradenitis suppurativa: Secondary | ICD-10-CM | POA: Insufficient documentation

## 2011-07-21 DIAGNOSIS — N898 Other specified noninflammatory disorders of vagina: Secondary | ICD-10-CM

## 2011-07-21 DIAGNOSIS — R309 Painful micturition, unspecified: Secondary | ICD-10-CM

## 2011-07-21 LAB — POCT URINALYSIS DIPSTICK
Glucose, UA: NEGATIVE
Leukocytes, UA: NEGATIVE
Spec Grav, UA: 1.02

## 2011-07-21 LAB — POCT WET PREP (WET MOUNT)

## 2011-07-21 NOTE — Progress Notes (Addendum)
[name] is a 21 y.o. year old female,No obstetric history on file., who presents for a problem visit. The patient has a history of hidradenitis.  She has a history of psoriasis. She is taking doxycycline daily but her symptoms are getting worse.  Subjective:  The patient complains of recurrent "boils" between her legs that are painful.  She complains of a vaginal discharge.  Objective:  BP 100/70  Temp 98.3 F (36.8 C)  Resp 14  Ht 5\' 7"  (1.702 m)  Wt 163 lb (73.936 kg)  BMI 25.53 kg/m2   GI: soft, non-tender; bowel sounds normal; no masses,  no organomegaly  External genitalia: the patient has skin abscesses in various stages between the labia and the thighs.  Some are tender. Vaginal: discharge present Cervix: normal appearance Adnexa: normal bimanual exam Uterus: normal size shape and consistency  Wet prep: PH 5.0, with negative, clue cells negative  Urine analysis: Negative  Assessment:  Hidradenitis uncontrolled with doxycycline Vaginal discharge  Plan:  Management of hidradenitis was discussed.  I will make arrangements for the patient to be [facility] at [facility] because I am uncertain what more I can do given that she is taking doxycycline daily.  Return to office prn if symptoms worsen or fail to improve.   Leonard Schwartz M.D.  07/21/2011 7:18 PM

## 2011-07-28 ENCOUNTER — Telehealth: Payer: Self-pay | Admitting: Obstetrics and Gynecology

## 2011-07-28 NOTE — Telephone Encounter (Signed)
See telephone call. Dr. Eisha Chatterjee 

## 2012-03-14 ENCOUNTER — Telehealth: Payer: Self-pay | Admitting: Obstetrics and Gynecology

## 2012-03-14 NOTE — Telephone Encounter (Signed)
Tc to pt per telephone call. Appt sched 03/15/12 @ 9:45a with AVS for eval. Pt agrees.

## 2012-03-15 ENCOUNTER — Ambulatory Visit (INDEPENDENT_AMBULATORY_CARE_PROVIDER_SITE_OTHER): Payer: BC Managed Care – PPO | Admitting: Obstetrics and Gynecology

## 2012-03-15 ENCOUNTER — Encounter: Payer: Self-pay | Admitting: Obstetrics and Gynecology

## 2012-03-15 VITALS — BP 100/60 | Temp 98.8°F | Wt 167.0 lb

## 2012-03-15 DIAGNOSIS — L732 Hidradenitis suppurativa: Secondary | ICD-10-CM

## 2012-03-15 MED ORDER — MEDROXYPROGESTERONE ACETATE 150 MG/ML IM SUSP: 150.0000 mg | Freq: Once | INTRAMUSCULAR | Status: DC

## 2012-03-15 MED ORDER — CLINDAMYCIN HCL 300 MG PO CAPS: 300.0000 mg | ORAL_CAPSULE | Freq: Two times a day (BID) | ORAL | Status: AC

## 2012-03-15 MED ORDER — RIFAMPIN 300 MG PO CAPS: 600.0000 mg | ORAL_CAPSULE | Freq: Every day | ORAL | Status: DC

## 2012-03-15 NOTE — Progress Notes (Signed)
HISTORY OF PRESENT ILLNESS  [name] is a 22 y.o. year old female,No obstetric history on file., who presents for a problem visit. The patient has a history of hidradenitis.  The hormone therapy and doxycycline therapy have not helped.  She complains of a tender swollen lesion in the left groin.  She says she has multiple scars all over her body.  Subjective:  See above.  Objective:  BP 100/60  Temp 98.8 F (37.1 C)  Wt 167 lb (75.751 kg)  LMP 03/15/2012   General: no distress  External genitalia: 2 cm tender skin lesion consistent with hidradenitis.  No cellulitis.  Not pointing.  Assessment:  Hidradenitis refractory to doxycycline therapy.  Plan:  We'll began clindamycin 300 mg twice each day and rifampin 600 mg each day.  The patient will return in 4 weeks for reevaluation.  Leonard Schwartz M.D.  03/15/2012 9:48 PM

## 2012-04-11 ENCOUNTER — Encounter: Payer: Self-pay | Admitting: Obstetrics and Gynecology

## 2012-04-11 ENCOUNTER — Ambulatory Visit: Payer: BC Managed Care – PPO | Admitting: Obstetrics and Gynecology

## 2012-04-11 VITALS — BP 110/62 | Wt 168.0 lb

## 2012-04-11 DIAGNOSIS — N760 Acute vaginitis: Secondary | ICD-10-CM

## 2012-04-11 MED ORDER — CLOTRIMAZOLE-BETAMETHASONE 1-0.05 % EX CREA: TOPICAL_CREAM | Freq: Two times a day (BID) | CUTANEOUS | Status: DC

## 2012-04-11 NOTE — Progress Notes (Signed)
HISTORY OF PRESENT ILLNESS  [name] is a 22 y.o. year old female,No obstetric history on file., who presents for a problem visit. The patient has a history of hidradenitis.  She did not respond to doxycycline.  She was started on clindamycin and rifampin.  She feels much better.  Subjective:  The patient complains of heartburn associated with her medication. She reports some vaginal itching.  She used Lotrimin cream from a family member.  She requested a prescription of her own.  Objective:  BP 110/62  Wt 168 lb (76.204 kg)  LMP 03/15/2012   General: no distress GI: soft and nontender  External genitalia: skin eruptions have completely healed.  Assessment:  Improved hidradenitis on clindamycin and rifampin.  Vaginitis  Plan:  Continue clindamycin and rifampin. The patient says she can tolerate the side effects and she wants to continue her medication.  Lotrimin cream to the vulva.  Return to office in 4 month(s) for annual exam or as needed.Leonard Schwartz M.D.  04/11/2012 6:44 PM    Color: NONE  Odor: no Itching:yes Burning on the outside only when it would have been the time of her cycle. Pt was given cream by fiance's Aunt that relieved itching. Pt has box "Clotrimazole and Betamethasone" Thin:no Thick:no Fever:no Dyspareunia:no Hx PID:no HX STD:no Pelvic Pain:no Desires Gc/CT:no Desires HIV,RPR,HbsAG:no

## 2013-08-02 ENCOUNTER — Telehealth: Payer: Self-pay

## 2013-08-02 NOTE — Telephone Encounter (Signed)
Patient states she was in here Wednesday bc she was hurt at work.  She has been taking mobic twice daily as directed, but is still swollen around elbow and having a lot of pain.  Rt hand is also swollen.  She wants to know if she can have a different medicine.    [facility] [place].

## 2013-08-02 NOTE — Telephone Encounter (Signed)
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

## 2013-08-02 NOTE — Telephone Encounter (Signed)
Pt would like to speak to someone about her "condition", non specific; please call [phone]

## 2013-08-03 NOTE — Telephone Encounter (Signed)
Please call patient told her we will call in a prescription for ultram  and see if that helps with her pain. I also am in  the office today and would be happy to see her.

## 2013-08-03 NOTE — Telephone Encounter (Signed)
Dr. Cleta Alberts spoke with pt and rx was sent into [facility]

## 2013-10-03 ENCOUNTER — Other Ambulatory Visit: Payer: Self-pay | Admitting: Family Medicine

## 2013-11-06 ENCOUNTER — Other Ambulatory Visit: Payer: Self-pay | Admitting: Family Medicine

## 2014-01-15 ENCOUNTER — Inpatient Hospital Stay (HOSPITAL_COMMUNITY)
Admission: AD | Admit: 2014-01-15 | Discharge: 2014-01-16 | Disposition: A | Discharge status: Home / Self Care | Payer: PRIVATE HEALTH INSURANCE | Source: Ambulatory Visit | Attending: Obstetrics and Gynecology | Admitting: Obstetrics and Gynecology

## 2014-01-15 ENCOUNTER — Encounter (HOSPITAL_COMMUNITY): Payer: Self-pay | Admitting: *Deleted

## 2014-01-15 DIAGNOSIS — R509 Fever, unspecified: Secondary | ICD-10-CM | POA: Diagnosis not present

## 2014-01-15 DIAGNOSIS — R109 Unspecified abdominal pain: Secondary | ICD-10-CM | POA: Insufficient documentation

## 2014-01-15 DIAGNOSIS — O039 Complete or unspecified spontaneous abortion without complication: Secondary | ICD-10-CM | POA: Insufficient documentation

## 2014-01-15 DIAGNOSIS — F17210 Nicotine dependence, cigarettes, uncomplicated: Secondary | ICD-10-CM | POA: Diagnosis not present

## 2014-01-15 DIAGNOSIS — Z3A00 Weeks of gestation of pregnancy not specified: Secondary | ICD-10-CM | POA: Insufficient documentation

## 2014-01-15 LAB — CBC WITH DIFFERENTIAL/PLATELET
Basophils Absolute: 0 10*3/uL (ref 0.0–0.1)
Basophils Relative: 0 % (ref 0–1)
Eosinophils Absolute: 0.2 10*3/uL (ref 0.0–0.7)
Eosinophils Relative: 2 % (ref 0–5)
HEMATOCRIT: 38.1 % (ref 36.0–46.0)
HEMOGLOBIN: 12.6 g/dL (ref 12.0–15.0)
LYMPHS ABS: 3.1 10*3/uL (ref 0.7–4.0)
LYMPHS PCT: 30 % (ref 12–46)
MCH: 31.1 pg (ref 26.0–34.0)
MCHC: 33.1 g/dL (ref 30.0–36.0)
MCV: 94.1 fL (ref 78.0–100.0)
MONO ABS: 0.8 10*3/uL (ref 0.1–1.0)
Monocytes Relative: 7 % (ref 3–12)
Neutro Abs: 6.2 10*3/uL (ref 1.7–7.7)
Neutrophils Relative %: 61 % (ref 43–77)
PLATELETS: 229 10*3/uL (ref 150–400)
RBC: 4.05 MIL/uL (ref 3.87–5.11)
RDW: 13 % (ref 11.5–15.5)
WBC: 10.2 10*3/uL (ref 4.0–10.5)

## 2014-01-15 MED ORDER — KETOROLAC TROMETHAMINE 30 MG/ML IJ SOLN
30.0000 mg | Freq: Once | INTRAMUSCULAR | Status: AC
  Administered 2014-01-15: 30 mg via INTRAVENOUS
  Filled 2014-01-15: qty 1

## 2014-01-15 NOTE — MAU Note (Signed)
Seen at MD office on Monday, states she is having a miscarriage. Began having heavy bleeding this pm. Lower abd pain, fever.

## 2014-01-15 NOTE — MAU Provider Note (Signed)
History    Johney FrameShannon J [name] is a G3P0 with LMP of 12/08/2013 who presents with fever and abdominal pain secondary to miscarriage.  Patient reports she started spotting on Sunday and went to the office, Monday, for evaluation.  Today patient informed of decreasing hCG levels and of inevitable miscarriage.  Patient reports that she started bleeding at 4pm, with onset of RLQ pain at 630pm.  Patient reports taking tylenol with minimal relief at 830pm.  Patient also states that she has been wearing tampons and has been saturating a super plus every 45-60 minutes. However, upon arrival patient wearing pad, per provider instruction, and bleeding minimal to scant. Patient states that she also had an axillary temperature of 102.4 and "shakiness."  Patient denies malodor from vaginal, issues with constipation/diarrhea, or urination.  Patient does report some nausea, but no vomiting.  Patient states she recently had a boil drained and was taking antibiotics, but discontinued medication, per doctors orders, on Monday.   Patient Active Problem List   Diagnosis Date Noted  . Vaginitis and vulvovaginitis 04/11/2012  . Overweight 07/21/2011  . Hidradenitis 07/21/2011  . Vaginal Discharge 07/21/2011  . DYSAUTONOMIA 02/24/2009  . HYPOTENSION, ORTHOSTATIC 01/21/2009  . OTHER CONVULSIONS 01/21/2009    Chief Complaint  Patient presents with  . Vaginal Bleeding  . Abdominal Pain  . Fever   HPI  OB History    Gravida Para Term Preterm AB TAB SAB Ectopic Multiple Living   3               Past Medical History  Diagnosis Date  . Seizures   . Syncope     neurally-mediated  . Anxiety   . Asthma   . Short PR-normal QRS complex syndrome     Past Surgical History  Procedure Laterality Date  . Wisdom tooth extraction    . Tonsillectomy      History reviewed. No pertinent family history.  History  Substance Use Topics  . Smoking status: Current Every Day Smoker -- 0.50 packs/day  . Smokeless tobacco:  Never Used  . Alcohol Use: No    Allergies: No Known Allergies  Prescriptions prior to admission  Medication Sig Dispense Refill Last Dose  . butalbital-acetaminophen-caffeine (FIORICET WITH CODEINE) 50-325-40-30 MG per capsule Take 1 capsule by mouth every 4 (four) hours as needed.     Not Taking  . clotrimazole-betamethasone (LOTRISONE) cream Apply topically 2 (two) times daily. 30 g 2   . doxycycline (DORYX) 100 MG DR capsule Take 100 mg by mouth daily.     Not Taking  . estradiol (ESTRACE) 0.5 MG tablet Take 0.5 mg by mouth daily.     Not Taking  . FLUoxetine (PROZAC) 20 MG capsule Take 20 mg by mouth daily.     Not Taking  . ibuprofen (ADVIL,MOTRIN) 800 MG tablet Take 800 mg by mouth every 8 (eight) hours as needed.     Not Taking  . meclizine (ANTIVERT) 12.5 MG tablet Take 12.5 mg by mouth 3 (three) times daily as needed.     Not Taking  . medroxyPROGESTERone (DEPO-PROVERA) 150 MG/ML injection Inject 150 mg into the muscle every 3 (three) months.   Taking  . medroxyPROGESTERone (DEPO-PROVERA) 150 MG/ML injection Inject 1 mL (150 mg total) into the muscle once. 1 mL 1   . rifampin (RIFADIN) 300 MG capsule Take 2 capsules (600 mg total) by mouth daily. 60 capsule 5     Review of Systems  Constitutional: Positive for fever and  chills.  HENT: Negative.   Respiratory: Negative.   Cardiovascular: Negative.   Gastrointestinal: Positive for nausea and abdominal pain. Negative for vomiting, diarrhea and constipation.  Genitourinary: Negative.   Musculoskeletal: Negative.   Skin: Negative.   Neurological: Negative.     Also See HPI Above Physical Exam    Filed Vitals:   01/15/14 2249 01/16/14 0035 01/16/14 0040  BP: 140/72  114/74  Pulse: 102  76  Temp: 99 F (37.2 C) 98.3 F (36.8 C)   TempSrc: Oral Oral   Resp: 18  16  Height: 5\' 6"  (1.676 m)    Weight: 171 lb (77.565 kg)    SpO2: 99%      Results for orders placed or performed during the hospital encounter of 01/15/14  (from the past 24 hour(s))  CBC with Differential     Status: None   Collection Time: 01/15/14 11:23 PM  Result Value Ref Range   WBC 10.2 4.0 - 10.5 K/uL   RBC 4.05 3.87 - 5.11 MIL/uL   Hemoglobin 12.6 12.0 - 15.0 g/dL   HCT 16.1 09.6 - 04.5 %   MCV 94.1 78.0 - 100.0 fL   MCH 31.1 26.0 - 34.0 pg   MCHC 33.1 30.0 - 36.0 g/dL   RDW 40.9 81.1 - 91.4 %   Platelets 229 150 - 400 K/uL   Neutrophils Relative % 61 43 - 77 %   Neutro Abs 6.2 1.7 - 7.7 K/uL   Lymphocytes Relative 30 12 - 46 %   Lymphs Abs 3.1 0.7 - 4.0 K/uL   Monocytes Relative 7 3 - 12 %   Monocytes Absolute 0.8 0.1 - 1.0 K/uL   Eosinophils Relative 2 0 - 5 %   Eosinophils Absolute 0.2 0.0 - 0.7 K/uL   Basophils Relative 0 0 - 1 %   Basophils Absolute 0.0 0.0 - 0.1 K/uL   Physical Exam  Constitutional: She is oriented to person, place, and time. She appears well-developed and well-nourished.  Cardiovascular: Normal rate, regular rhythm and normal heart sounds.   Respiratory: Effort normal and breath sounds normal.  GI: Soft. Bowel sounds are normal. There is tenderness in the suprapubic area and left lower quadrant.  Genitourinary: Uterus is not tender. Cervix exhibits no motion tenderness and no friability. There is bleeding in the vagina.  Sterile Speculum Exam: -Vulva: No s/s of boil, drainage, or infection -Vaginal Vault: Small amt dark red blood noted -Cervix: No active bleeding from os-appears closed   Bimanual Exam: Closed/Long/Thick   Neurological: She is alert and oriented to person, place, and time.  Skin: Skin is warm and dry. There is pallor.  Psychiatric: She has a normal mood and affect.    ED Course  Assessment: 23 y.o. G3P0 SAB Abdominal Pain  Plan: -CBC with Differential -Toradol IM  -Currently afebrile, will recheck temperature   Follow Up (0025) -Pelvic Exam as charted -Labs WNL -Discussed vaginal hygiene during miscarriage including nothing in vagina until bleeding  subsides -Educated on who and when to call for gynecological related concerns -Observe abdominal pain and schedule appt if pain does not subside in 48 hours or report to MAU, at any time, if pain worsens -Follow up in office in 2 weeks for repeat hCG--Will send message to [facility] triage staff -Rx for Ibuprofen 800mg  Q8hrs prn -Okay to use percocet for pain -Encouraged to call if any questions or concerns arise prior to next scheduled office visit.  -Discharged to home in stable condtion  [facility],  Barre Aydelott LYNN CNM, MSN 01/15/2014 11:01 PM

## 2014-01-15 NOTE — MAU Note (Signed)
Pt states she has been having heavy bleeding since about 1600 this afternoon, pt states she went to sleep and woke with stabbing pain in her pelvic area.Pt states she has a nervous "jitter" also.

## 2014-01-16 MED ORDER — IBUPROFEN 800 MG PO TABS: 800.0000 mg | ORAL_TABLET | Freq: Three times a day (TID) | ORAL | Status: DC | PRN

## 2014-01-16 NOTE — Progress Notes (Signed)
Small amount of vaginal bleeding noted 

## 2014-01-16 NOTE — Discharge Instructions (Signed)
Miscarriage A miscarriage is the loss of an unborn baby (fetus) before the 20th week of pregnancy. The cause is often unknown.  HOME CARE  You may need to stay in bed (bed rest), or you may be able to do light activity. Go about activity as told by your doctor.  Have help at home.  Write down how many pads you use each day. Write down how soaked they are.  Do not use tampons. Do not wash out your vagina (douche) or have sex (intercourse) until your doctor approves.  Only take medicine as told by your doctor.  Do not take aspirin.  Keep all doctor visits as told.  If you or your partner have problems with grieving, talk to your doctor. You can also try counseling. Give yourself time to grieve before trying to get pregnant again. GET HELP RIGHT AWAY IF:  You have bad cramps or pain in your back or belly (abdomen).  You have a fever.  You pass large clumps of blood (clots) from your vagina that are walnut-sized or larger. Save the clumps for your doctor to see.  You pass large amounts of tissue from your vagina. Save the tissue for your doctor to see.  You have more bleeding.  You have thick, bad-smelling fluid (discharge) coming from the vagina.  You get lightheaded, weak, or you pass out (faint).  You have chills. MAKE SURE YOU:  Understand these instructions.  Will watch your condition.  Will get help right away if you are not doing well or get worse. Document Released: 05/16/2011 Document Reviewed: 05/16/2011 [facility] Patient Information 2015 [place]Care, [place]. This information is not intended to replace advice given to you by your health care provider. Make sure you discuss any questions you have with your health care provider. Recurrent Pregnancy Loss Recurrent pregnancy loss is the loss of three or more pregnancies before 20 weeks of gestation. Losing three or more pregnancies in a row is rare.  CAUSES  The most common cause of recurrent pregnancy loss is an abnormal  number of chromosomes in the developing baby (fetus). Chromosomes are the structures inside cells that hold all your genetic material. In most cases of recurrent pregnancy loss, a missing or extra chromosome keeps a baby from developing. It may not be possible to identify which chromosome is defective. Chromosome abnormalities can be inherited, but most of the time they occur by chance. Other possible causes of recurrent pregnancy loss include:  Being born with an abnormal womb structure (septate uterus).  Having noncancerous growths in your uterus (fibroids or polyps).  Having a disease that causes scarring in your uterus (Asherman syndrome).  Having a disease that causes your blood to clot (antiphospholipid syndrome).  Having a disease that increases bleeding (thrombophilia). RISK FACTORS The risk of recurrent pregnancy loss increases as your age increases. Other risk factors include:  Diabetes.  Thyroid disease.  Obesity.  Smoking.  Excessive alcohol or caffeine.  Recreational drug use. SIGNS AND SYMPTOMS  Vaginal bleeding.  Passing clots vaginally.  Abdominal pain or cramps.  Low back pain. DIAGNOSIS  Your health care provider can create an image of your womb using sound waves and a computer (ultrasound) to confirm your pregnancy by 5 or 6 weeks after you conceive. Ultrasound can also confirm a pregnancy loss. Your health care provider may do a complete physical exam, including your vagina and uterus (pelvic exam), to find possible causes of recurrent pregnancy loss.  Other tests may include:  Ultrasound imaging to  see if the structure of your uterus is normal or if you have polyps or fibroids.  Blood tests to see if you have a disease that causes recurrent pregnancy loss.  Blood tests to see if your blood clots normally.  Genetic testing of you and your partner. TREATMENT  In many cases, there is no specific treatment. Depending on the cause, possible treatments  include:  Having your eggs fertilized outside your uterus (in vitro fertilization). By doing this, a health care provider may be able to select eggs without chromosome abnormalities.  Taking a blood thinner to prevent clotting if you have antiphospholipid syndrome.  Having corrective surgery if you have an abnormality in your uterus. HOME CARE INSTRUCTIONS  Follow all your health care provider's home instructions carefully. These may include:  Do not smoke or use recreational drugs.  Do not drink alcohol if you are pregnant.  Do not put anything in your vagina or have sex for 2 weeks after you have had a miscarriage.  If you are Rh negative and have had a miscarriage, you may need to get a shot of Rho (D) immune globulin. Ask your doctor if you need this shot.  Use birth control if you do not want to get pregnant. You can get pregnant [redacted] weeks after a miscarriage.  Get support from friends and loved ones. Miscarriage can be a sad and stressful event. SEEK MEDICAL CARE IF:   You have light vaginal bleeding or spotting while pregnant.  You have been trying to get pregnant without success.  You are struggling with sadness or depression after a miscarriage. SEEK IMMEDIATE MEDICAL CARE IF:  You have heavy vaginal bleeding and abdominal cramps.  You have fever, chills, and severe abdominal pain. Document Released: 08/10/2007 Document Revised: 02/26/2013 Document Reviewed: 12/14/2012 [facility]ExitCare Patient Information 2015 [place]ExitCare, MarylandLLC. This information is not intended to replace advice given to you by your health care provider. Make sure you discuss any questions you have with your health care provider.

## 2014-03-07 NOTE — L&D Delivery Note (Signed)
Delivery Note At 11:19 AM a viable female, "Crystal Newman", was delivered via Vaginal, Spontaneous Delivery (Presentation: Right Occiput Anterior).  APGAR: , ; weight  .   Placenta status: Intact, Spontaneous.  Cord: 3 vessels with the following complications: None.  Cord pH: NA  Anesthesia: Epidural  Episiotomy:  None Lacerations: Right and left labial Suture Repair: 3.0 vicryl Est. Blood Loss (mL):  150 cc  Mom to postpartum.  Baby to Couplet care / Skin to Skin. Patient plans outpatient circumcision at peds or [facility]. Patient planning Micronor for contraception.  Erryn Dickison 02/11/2015, 12:30 PM

## 2014-06-03 LAB — OB RESULTS CONSOLE ANTIBODY SCREEN: ANTIBODY SCREEN: NEGATIVE

## 2014-06-03 LAB — OB RESULTS CONSOLE ABO/RH: RH TYPE: POSITIVE

## 2014-06-03 LAB — OB RESULTS CONSOLE GBS: GBS: POSITIVE

## 2014-06-03 LAB — OB RESULTS CONSOLE HIV ANTIBODY (ROUTINE TESTING): HIV: NONREACTIVE

## 2014-06-03 LAB — OB RESULTS CONSOLE RPR: RPR: NONREACTIVE

## 2014-06-03 LAB — OB RESULTS CONSOLE RUBELLA ANTIBODY, IGM: RUBELLA: NON-IMMUNE/NOT IMMUNE

## 2014-06-03 LAB — OB RESULTS CONSOLE HEPATITIS B SURFACE ANTIGEN: HEP B S AG: NEGATIVE

## 2014-06-05 DIAGNOSIS — O200 Threatened abortion: Secondary | ICD-10-CM | POA: Insufficient documentation

## 2014-06-15 ENCOUNTER — Encounter (HOSPITAL_COMMUNITY): Payer: Self-pay | Admitting: *Deleted

## 2014-06-15 ENCOUNTER — Inpatient Hospital Stay (HOSPITAL_COMMUNITY)
Admission: AD | Admit: 2014-06-15 | Discharge: 2014-06-15 | Disposition: A | Discharge status: Home / Self Care | Payer: Medicaid Other | Source: Ambulatory Visit | Attending: Obstetrics and Gynecology | Admitting: Obstetrics and Gynecology

## 2014-06-15 ENCOUNTER — Inpatient Hospital Stay (HOSPITAL_COMMUNITY): Payer: Medicaid Other

## 2014-06-15 DIAGNOSIS — Z3A01 Less than 8 weeks gestation of pregnancy: Secondary | ICD-10-CM | POA: Insufficient documentation

## 2014-06-15 DIAGNOSIS — B373 Candidiasis of vulva and vagina: Secondary | ICD-10-CM | POA: Diagnosis not present

## 2014-06-15 DIAGNOSIS — O98811 Other maternal infectious and parasitic diseases complicating pregnancy, first trimester: Secondary | ICD-10-CM | POA: Insufficient documentation

## 2014-06-15 DIAGNOSIS — R109 Unspecified abdominal pain: Secondary | ICD-10-CM | POA: Diagnosis present

## 2014-06-15 DIAGNOSIS — O99331 Smoking (tobacco) complicating pregnancy, first trimester: Secondary | ICD-10-CM | POA: Insufficient documentation

## 2014-06-15 DIAGNOSIS — O2341 Unspecified infection of urinary tract in pregnancy, first trimester: Secondary | ICD-10-CM | POA: Diagnosis not present

## 2014-06-15 DIAGNOSIS — R58 Hemorrhage, not elsewhere classified: Secondary | ICD-10-CM

## 2014-06-15 LAB — URINALYSIS, ROUTINE W REFLEX MICROSCOPIC
Bilirubin Urine: NEGATIVE
GLUCOSE, UA: NEGATIVE mg/dL
KETONES UR: NEGATIVE mg/dL
NITRITE: NEGATIVE
Protein, ur: NEGATIVE mg/dL
Specific Gravity, Urine: 1.025 (ref 1.005–1.030)
UROBILINOGEN UA: 0.2 mg/dL (ref 0.0–1.0)
pH: 5.5 (ref 5.0–8.0)

## 2014-06-15 LAB — CBC WITH DIFFERENTIAL/PLATELET
BASOS PCT: 0 % (ref 0–1)
Basophils Absolute: 0 10*3/uL (ref 0.0–0.1)
EOS ABS: 0.2 10*3/uL (ref 0.0–0.7)
Eosinophils Relative: 1 % (ref 0–5)
HEMATOCRIT: 38.4 % (ref 36.0–46.0)
HEMOGLOBIN: 12.8 g/dL (ref 12.0–15.0)
LYMPHS ABS: 2.9 10*3/uL (ref 0.7–4.0)
LYMPHS PCT: 23 % (ref 12–46)
MCH: 31 pg (ref 26.0–34.0)
MCHC: 33.3 g/dL (ref 30.0–36.0)
MCV: 93 fL (ref 78.0–100.0)
MONO ABS: 0.6 10*3/uL (ref 0.1–1.0)
MONOS PCT: 5 % (ref 3–12)
Neutro Abs: 8.6 10*3/uL — ABNORMAL HIGH (ref 1.7–7.7)
Neutrophils Relative %: 71 % (ref 43–77)
PLATELETS: 236 10*3/uL (ref 150–400)
RBC: 4.13 MIL/uL (ref 3.87–5.11)
RDW: 13.1 % (ref 11.5–15.5)
WBC: 12.3 10*3/uL — ABNORMAL HIGH (ref 4.0–10.5)

## 2014-06-15 LAB — WET PREP, GENITAL
Clue Cells Wet Prep HPF POC: NONE SEEN
TRICH WET PREP: NONE SEEN

## 2014-06-15 LAB — OB RESULTS CONSOLE GC/CHLAMYDIA: Gonorrhea: NEGATIVE

## 2014-06-15 LAB — HCG, QUANTITATIVE, PREGNANCY: hCG, Beta Chain, Quant, S: 24263 m[IU]/mL — ABNORMAL HIGH (ref <5)

## 2014-06-15 LAB — URINE MICROSCOPIC-ADD ON

## 2014-06-15 MED ORDER — FLUCONAZOLE 100 MG PO TABS: 150.0000 mg | ORAL_TABLET | Freq: Once | ORAL | Status: AC

## 2014-06-15 MED ORDER — NITROFURANTOIN MONOHYD MACRO 100 MG PO CAPS: 100.0000 mg | ORAL_CAPSULE | Freq: Two times a day (BID) | ORAL | Status: AC

## 2014-06-15 NOTE — MAU Note (Signed)
Pt states here for bleeding brown blood. Had u/s Thursday, all wnl. Intermittent cramping. Hx multiple miscarriages.

## 2014-06-15 NOTE — Discharge Instructions (Signed)
Vaginal Bleeding During Pregnancy, First Trimester  A small amount of bleeding (spotting) from the vagina is relatively common in early pregnancy. It usually stops on its own. Various things may cause bleeding or spotting in early pregnancy. Some bleeding may be related to the pregnancy, and some may not. In most cases, the bleeding is normal and is not a problem. However, bleeding can also be a sign of something serious. Be sure to tell your health care provider about any vaginal bleeding right away.  Some possible causes of vaginal bleeding during the first trimester include:  · Infection or inflammation of the cervix.  · Growths (polyps) on the cervix.  · Miscarriage or threatened miscarriage.  · Pregnancy tissue has developed outside of the uterus and in a fallopian tube (tubal pregnancy).  · Tiny cysts have developed in the uterus instead of pregnancy tissue (molar pregnancy).  HOME CARE INSTRUCTIONS   Watch your condition for any changes. The following actions may help to lessen any discomfort you are feeling:  · Follow your health care provider's instructions for limiting your activity. If your health care provider orders bed rest, you may need to stay in bed and only get up to use the bathroom. However, your health care provider may allow you to continue light activity.  · If needed, make plans for someone to help with your regular activities and responsibilities while you are on bed rest.  · Keep track of the number of pads you use each day, how often you change pads, and how soaked (saturated) they are. Write this down.  · Do not use tampons. Do not douche.  · Do not have sexual intercourse or orgasms until approved by your health care provider.  · If you pass any tissue from your vagina, save the tissue so you can show it to your health care provider.  · Only take over-the-counter or prescription medicines as directed by your health care provider.  · Do not take aspirin because it can make you  bleed.  · Keep all follow-up appointments as directed by your health care provider.  SEEK MEDICAL CARE IF:  · You have any vaginal bleeding during any part of your pregnancy.  · You have cramps or labor pains.  · You have a fever, not controlled by medicine.  SEEK IMMEDIATE MEDICAL CARE IF:   · You have severe cramps in your back or belly (abdomen).  · You pass large clots or tissue from your vagina.  · Your bleeding increases.  · You feel light-headed or weak, or you have fainting episodes.  · You have chills.  · You are leaking fluid or have a gush of fluid from your vagina.  · You pass out while having a bowel movement.  MAKE SURE YOU:  · Understand these instructions.  · Will watch your condition.  · Will get help right away if you are not doing well or get worse.  Document Released: 12/01/2004 Document Revised: 02/26/2013 Document Reviewed: 10/29/2012  ExitCare® Patient Information ©2015 ExitCare, LLC. This information is not intended to replace advice given to you by your health care provider. Make sure you discuss any questions you have with your health care provider.

## 2014-06-15 NOTE — MAU Provider Note (Signed)
Crystal Newman is a 24 y.o. G6P0050 at 6.0 weeks hx of loss x5 c/o cramping and brown discharge   History     Patient Active Problem List   Diagnosis Date Noted  . Vaginitis and vulvovaginitis 04/11/2012  . Overweight 07/21/2011  . Hidradenitis 07/21/2011  . Vaginal Discharge 07/21/2011  . DYSAUTONOMIA 02/24/2009  . HYPOTENSION, ORTHOSTATIC 01/21/2009  . OTHER CONVULSIONS 01/21/2009    Chief Complaint  Patient presents with  . Possible Pregnancy  . Vaginal Bleeding   HPI  OB History    Gravida Para Term Preterm AB TAB SAB Ectopic Multiple Living   6    5  5          Past Medical History  Diagnosis Date  . Seizures   . Syncope     neurally-mediated  . Anxiety   . Short PR-normal QRS complex syndrome   . Asthma     sports induced    Past Surgical History  Procedure Laterality Date  . Wisdom tooth extraction    . Tonsillectomy      No family history on file.  History  Substance Use Topics  . Smoking status: Current Every Day Smoker -- 0.50 packs/day  . Smokeless tobacco: Never Used  . Alcohol Use: No    Allergies: No Known Allergies  Prescriptions prior to admission  Medication Sig Dispense Refill Last Dose  . butalbital-acetaminophen-caffeine (FIORICET WITH CODEINE) 50-325-40-30 MG per capsule Take 1 capsule by mouth every 4 (four) hours as needed.     Not Taking  . FLUoxetine (PROZAC) 20 MG capsule Take 20 mg by mouth daily.     Not Taking  . ibuprofen (ADVIL,MOTRIN) 800 MG tablet Take 800 mg by mouth every 8 (eight) hours as needed.     Not Taking  . ibuprofen (ADVIL,MOTRIN) 800 MG tablet Take 1 tablet (800 mg total) by mouth every 8 (eight) hours as needed. 30 tablet 1   . rifampin (RIFADIN) 300 MG capsule Take 2 capsules (600 mg total) by mouth daily. 60 capsule 5     ROS See HPI above, all other systems are negative  Physical Exam   Blood pressure 135/84, pulse 106, temperature 98.2 F (36.8 C), resp. rate 18, height 5\' 6"  (1.676 m), weight  170 lb (77.111 kg).  Physical Exam Ext:  WNL ABD: Soft, non tender to palpation, no rebound or guarding SVE: C/T/H/ firm Thick slightly pink tinged creamy discharge   ED Course  Assessment: IUP at  6.0 weeks Membranes: intact   Plan: Labs: CBC, GC, CL, quant US for viability   Keyundra Fant, CNM, MSN 06/15/2014. 4:41 PM     MAU Addendum Note  Results for orders placed or performed during the hospital encounter of 06/15/14 (from the past 24 hour(s))  Urinalysis, Routine w reflex microscopic     Status: Abnormal   Collection Time: 06/15/14  4:20 PM  Result Value Ref Range   Color, Urine YELLOW YELLOW   APPearance CLEAR CLEAR   Specific Gravity, Urine 1.025 1.005 - 1.030   pH 5.5 5.0 - 8.0   Glucose, UA NEGATIVE NEGATIVE mg/dL   Hgb urine dipstick SMALL (A) NEGATIVE   Bilirubin Urine NEGATIVE NEGATIVE   Ketones, ur NEGATIVE NEGATIVE mg/dL   Protein, ur NEGATIVE NEGATIVE mg/dL   Urobilinogen, UA 0.2 0.0 - 1.0 mg/dL   Nitrite NEGATIVE NEGATIVE   Leukocytes, UA SMALL (A) NEGATIVE  CBC with Differential/Platelet     Status: Abnormal   Collection Time: 06/15/14  4:20 PM  Result Value Ref Range   WBC 12.3 (H) 4.0 - 10.5 K/uL   RBC 4.13 3.87 - 5.11 MIL/uL   Hemoglobin 12.8 12.0 - 15.0 g/dL   HCT 16.1 09.6 - 04.5 %   MCV 93.0 78.0 - 100.0 fL   MCH 31.0 26.0 - 34.0 pg   MCHC 33.3 30.0 - 36.0 g/dL   RDW 40.9 81.1 - 91.4 %   Platelets 236 150 - 400 K/uL   Neutrophils Relative % 71 43 - 77 %   Neutro Abs 8.6 (H) 1.7 - 7.7 K/uL   Lymphocytes Relative 23 12 - 46 %   Lymphs Abs 2.9 0.7 - 4.0 K/uL   Monocytes Relative 5 3 - 12 %   Monocytes Absolute 0.6 0.1 - 1.0 K/uL   Eosinophils Relative 1 0 - 5 %   Eosinophils Absolute 0.2 0.0 - 0.7 K/uL   Basophils Relative 0 0 - 1 %   Basophils Absolute 0.0 0.0 - 0.1 K/uL  Urine microscopic-add on     Status: Abnormal   Collection Time: 06/15/14  4:20 PM  Result Value Ref Range   Squamous Epithelial / LPF FEW (A) RARE   WBC, UA  0-2 <3 WBC/hpf   RBC / HPF 3-6 <3 RBC/hpf   Bacteria, UA RARE RARE  Wet prep, genital     Status: Abnormal   Collection Time: 06/15/14  4:55 PM  Result Value Ref Range   Yeast Wet Prep HPF POC MODERATE (A) NONE SEEN   Trich, Wet Prep NONE SEEN NONE SEEN   Clue Cells Wet Prep HPF POC NONE SEEN NONE SEEN   WBC, Wet Prep HPF POC TOO NUMEROUS TO COUNT (A) NONE SEEN    US IMPRESSION: Intrauterine gestational sac containing a yolk sac. Estimated gestational age by mean sac diameter of 6 weeks 1 day, however the gestational sac is irregular in shape. No fetal pole or cardiac activity yet visualized. Recommend follow-up quantitative B-HCG levels and follow-up US in 14 days to assess viability.   Assessment Yeast infection UTI   Plan: -Rx for Diflucan and Macrobid -Discussed need to follow up in office on 4/18 -Bleeding and PTL Precautions -Encouraged to call if any questions or concerns arise prior to next scheduled office visit.  -Discharged to home in stable condition    Othar Curto, CNM, MSN 06/15/2014. 5:22 PM

## 2014-06-16 LAB — GC/CHLAMYDIA PROBE AMP (~~LOC~~) NOT AT ARMC
Chlamydia: NEGATIVE
Neisseria Gonorrhea: NEGATIVE

## 2014-09-25 ENCOUNTER — Encounter (HOSPITAL_COMMUNITY): Payer: Self-pay

## 2014-09-25 ENCOUNTER — Inpatient Hospital Stay (HOSPITAL_COMMUNITY)
Admission: AD | Admit: 2014-09-25 | Discharge: 2014-09-25 | Disposition: A | Discharge status: Home / Self Care | Payer: Medicaid Other | Source: Ambulatory Visit | Attending: Obstetrics and Gynecology | Admitting: Obstetrics and Gynecology

## 2014-09-25 DIAGNOSIS — Z3A20 20 weeks gestation of pregnancy: Secondary | ICD-10-CM | POA: Diagnosis not present

## 2014-09-25 DIAGNOSIS — O2622 Pregnancy care for patient with recurrent pregnancy loss, second trimester: Secondary | ICD-10-CM | POA: Diagnosis not present

## 2014-09-25 DIAGNOSIS — O99342 Other mental disorders complicating pregnancy, second trimester: Secondary | ICD-10-CM | POA: Diagnosis not present

## 2014-09-25 DIAGNOSIS — O4692 Antepartum hemorrhage, unspecified, second trimester: Secondary | ICD-10-CM | POA: Diagnosis present

## 2014-09-25 DIAGNOSIS — O468X2 Other antepartum hemorrhage, second trimester: Secondary | ICD-10-CM | POA: Insufficient documentation

## 2014-09-25 DIAGNOSIS — F419 Anxiety disorder, unspecified: Secondary | ICD-10-CM | POA: Diagnosis not present

## 2014-09-25 LAB — CBC
HCT: 35.7 % — ABNORMAL LOW (ref 36.0–46.0)
HEMOGLOBIN: 11.9 g/dL — AB (ref 12.0–15.0)
MCH: 31.1 pg (ref 26.0–34.0)
MCHC: 33.3 g/dL (ref 30.0–36.0)
MCV: 93.2 fL (ref 78.0–100.0)
PLATELETS: 209 10*3/uL (ref 150–400)
RBC: 3.83 MIL/uL — ABNORMAL LOW (ref 3.87–5.11)
RDW: 13.4 % (ref 11.5–15.5)
WBC: 13.4 10*3/uL — AB (ref 4.0–10.5)

## 2014-09-25 NOTE — MAU Note (Signed)
Noticed bleeding since last night when wiping after using the bathroom having cramping in left lower abdomen.

## 2014-09-25 NOTE — MAU Provider Note (Signed)
DATE: 09/25/2014  Maternity Admissions Unit History and Physical Exam for an Obstetrics Patient  [name] is a 24 y.o. female, G6P0050, at [redacted]w[redacted]d gestation, who presents for evaluation of bleeding. She reports having vaginal spotting after intercourse yesterday. She denies rupture membranes. She has been followed at the [facility] of [facility].  Her pregnancy has been complicated by a history of prior miscarriages, and anxiety. Her blood type is O+. See history below.  OB History    Gravida Para Term Preterm AB TAB SAB Ectopic Multiple Living   Past Medical History  Diagnosis Date  . Syncope     neurally-mediated  . Anxiety   . Short PR-normal QRS complex syndrome   . Asthma     sports induced  . Seizures     childhood    Prescriptions prior to admission  Medication Sig Dispense Refill Last Dose  . acetaminophen (TYLENOL) 500 MG tablet Take 1,000 mg by mouth every 6 (six) hours as needed for mild pain.   Past Week at Unknown time  . Prenat w/o [id number] (CITRANATAL HARMONY PO) Take 2 each by mouth daily.   06/14/2014 at Unknown time  . Progesterone 50 MG SUPP Place 1 suppository vaginally at bedtime.   06/14/2014 at Unknown time    Past Surgical History  Procedure Laterality Date  . Wisdom tooth extraction    . Tonsillectomy      No Known Allergies  Family History: family history is not on file.  Social History:  reports that she has been smoking.  She has never used smokeless tobacco. She reports that she does not drink alcohol or use illicit drugs.  Review of systems: Normal pregnancy complaints.  Admission Physical Exam:    Body mass index is 29.29 kg/(m^2).  Blood pressure 112/74, pulse 106, temperature 98.4 F (36.9 C), temperature source Oral, resp. rate 18, height  (1.676 m), weight 181 lb 6.4 oz (82.283 kg).  HEENT:                 Within normal limits Chest:                    Clear Heart:                    Regular rate and rhythm Abdomen:             Gravid and nontender Extremities:          Grossly normal Neurologic exam: Grossly normal Pelvic exam:         Cervix: Closed and long. Speculum exam shows no blood in the vault. No lesions are noted on the cervix.  Stable fetal heart tones.   CBC    Component Value Date/Time   WBC 13.4* 09/25/2014 0913   RBC 3.83* 09/25/2014 0913   HGB 11.9* 09/25/2014 0913   HCT 35.7* 09/25/2014 0913   PLT 209 09/25/2014 0913   MCV 93.2 09/25/2014 0913   MCH 31.1 09/25/2014 0913   MCHC 33.3 09/25/2014 0913   RDW 13.4 09/25/2014 0913   LYMPHSABS 2.9 06/15/2014 1620   MONOABS 0.6 06/15/2014 1620   EOSABS 0.2 06/15/2014 1620   BASOSABS 0.0 06/15/2014 1620      Assessment:  [redacted]w[redacted]d gestation  Post coital bleeding  History of recurrent miscarriages  Anxiety  Plan:  Bleeding precautions  given.  Return to the office for her routine visit on 10/17/2014.  Call for questions or concerns.  Blood type is O+.   Janine Limbo 09/25/2014, 9:43 AM

## 2014-09-25 NOTE — Discharge Instructions (Signed)
Vaginal Bleeding During Pregnancy, Second Trimester °A small amount of bleeding (spotting) from the vagina is relatively common in pregnancy. It usually stops on its own. Various things can cause bleeding or spotting in pregnancy. Some bleeding may be related to the pregnancy, and some may not. Sometimes the bleeding is normal and is not a problem. However, bleeding can also be a sign of something serious. Be sure to tell your health care provider about any vaginal bleeding right away. °Some possible causes of vaginal bleeding during the second trimester include: °· Infection, inflammation, or growths on the cervix.   °· The placenta may be partially or completely covering the opening of the cervix inside the uterus (placenta previa). °· The placenta may have separated from the uterus (abruption of the placenta).   °· You may be having early (preterm) labor.   °· The cervix may not be strong enough to keep a baby inside the uterus (cervical insufficiency).   °· Tiny cysts may have developed in the uterus instead of pregnancy tissue (molar pregnancy).  °HOME CARE INSTRUCTIONS  °Watch your condition for any changes. The following actions may help to lessen any discomfort you are feeling: °· Follow your health care provider's instructions for limiting your activity. If your health care provider orders bed rest, you may need to stay in bed and only get up to use the bathroom. However, your health care provider may allow you to continue light activity. °· If needed, make plans for someone to help with your regular activities and responsibilities while you are on bed rest. °· Keep track of the number of pads you use each day, how often you change pads, and how soaked (saturated) they are. Write this down. °· Do not use tampons. Do not douche. °· Do not have sexual intercourse or orgasms until approved by your health care provider. °· If you pass any tissue from your vagina, save the tissue so you can show it to your  health care provider. °· Only take over-the-counter or prescription medicines as directed by your health care provider. °· Do not take aspirin because it can make you bleed. °· Do not exercise or perform any strenuous activities or heavy lifting without your health care provider's permission. °· Keep all follow-up appointments as directed by your health care provider. °SEEK MEDICAL CARE IF: °· You have any vaginal bleeding during any part of your pregnancy. °· You have cramps or labor pains. °· You have a fever, not controlled by medicine. °SEEK IMMEDIATE MEDICAL CARE IF:  °· You have severe cramps in your back or belly (abdomen). °· You have contractions. °· You have chills. °· You pass large clots or tissue from your vagina. °· Your bleeding increases. °· You feel light-headed or weak, or you have fainting episodes. °· You are leaking fluid or have a gush of fluid from your vagina. °MAKE SURE YOU: °· Understand these instructions. °· Will watch your condition. °· Will get help right away if you are not doing well or get worse. °Document Released: 12/01/2004 Document Revised: 02/26/2013 Document Reviewed: 10/29/2012 °ExitCare® Patient Information ©2015 ExitCare, LLC. This information is not intended to replace advice given to you by your health care provider. Make sure you discuss any questions you have with your health care provider. ° °

## 2014-11-09 ENCOUNTER — Encounter (HOSPITAL_COMMUNITY): Payer: Self-pay | Admitting: *Deleted

## 2014-11-09 ENCOUNTER — Inpatient Hospital Stay (HOSPITAL_COMMUNITY)
Admission: AD | Admit: 2014-11-09 | Discharge: 2014-11-09 | Disposition: A | Discharge status: Home / Self Care | Payer: Medicaid Other | Source: Ambulatory Visit | Attending: Obstetrics and Gynecology | Admitting: Obstetrics and Gynecology

## 2014-11-09 ENCOUNTER — Inpatient Hospital Stay (HOSPITAL_COMMUNITY): Payer: Medicaid Other

## 2014-11-09 DIAGNOSIS — O26892 Other specified pregnancy related conditions, second trimester: Secondary | ICD-10-CM | POA: Diagnosis not present

## 2014-11-09 DIAGNOSIS — Z3A27 27 weeks gestation of pregnancy: Secondary | ICD-10-CM | POA: Diagnosis not present

## 2014-11-09 DIAGNOSIS — O26899 Other specified pregnancy related conditions, unspecified trimester: Secondary | ICD-10-CM

## 2014-11-09 DIAGNOSIS — N898 Other specified noninflammatory disorders of vagina: Secondary | ICD-10-CM | POA: Diagnosis not present

## 2014-11-09 DIAGNOSIS — F17210 Nicotine dependence, cigarettes, uncomplicated: Secondary | ICD-10-CM | POA: Diagnosis not present

## 2014-11-09 DIAGNOSIS — R109 Unspecified abdominal pain: Secondary | ICD-10-CM | POA: Insufficient documentation

## 2014-11-09 LAB — URINALYSIS, ROUTINE W REFLEX MICROSCOPIC
Bilirubin Urine: NEGATIVE
Glucose, UA: NEGATIVE mg/dL
Hgb urine dipstick: NEGATIVE
KETONES UR: NEGATIVE mg/dL
Leukocytes, UA: NEGATIVE
NITRITE: NEGATIVE
PROTEIN: NEGATIVE mg/dL
Specific Gravity, Urine: <1.005 — ABNORMAL LOW (ref 1.005–1.030)
Urobilinogen, UA: 0.2 mg/dL (ref 0.0–1.0)
pH: 5.5 (ref 5.0–8.0)

## 2014-11-09 LAB — WET PREP, GENITAL
Clue Cells Wet Prep HPF POC: NONE SEEN
Trich, Wet Prep: NONE SEEN
YEAST WET PREP: NONE SEEN

## 2014-11-09 LAB — AMNISURE RUPTURE OF MEMBRANE (ROM) NOT AT ARMC: Amnisure ROM: NEGATIVE

## 2014-11-09 MED ORDER — LACTATED RINGERS IV BOLUS (SEPSIS)
1000.0000 mL | Freq: Once | INTRAVENOUS | Status: AC
  Administered 2014-11-09: 1000 mL via INTRAVENOUS

## 2014-11-09 MED ORDER — NIFEDIPINE 10 MG PO CAPS
10.0000 mg | ORAL_CAPSULE | ORAL | Status: DC | PRN
  Administered 2014-11-09 (×3): 10 mg via ORAL
  Filled 2014-11-09 (×3): qty 1

## 2014-11-09 MED ORDER — NIFEDIPINE 10 MG PO CAPS: 10.0000 mg | ORAL_CAPSULE | Freq: Four times a day (QID) | ORAL | Status: DC | PRN

## 2014-11-09 MED ORDER — HYDROXYPROGESTERONE CAPROATE 250 MG/ML IM OIL
250.0000 mg | TOPICAL_OIL | Freq: Once | INTRAMUSCULAR | Status: AC
  Administered 2014-11-09: 250 mg via INTRAMUSCULAR
  Filled 2014-11-09: qty 1

## 2014-11-09 NOTE — Progress Notes (Signed)
Venus Standard CNM notified of pt's admission and status. Will obtain u/s

## 2014-11-09 NOTE — Discharge Instructions (Signed)

## 2014-11-09 NOTE — MAU Note (Signed)
Having some abd cramping and had large amt white mucousy d/c.

## 2014-11-09 NOTE — Progress Notes (Signed)
Venus Standard CNM on unit. 

## 2014-11-09 NOTE — Progress Notes (Signed)
V. Standard CNM on unit and aware of ctx pattern

## 2014-11-09 NOTE — MAU Provider Note (Signed)
Crystal Newman is a 24 y.o. G6P0 at 27.0 weeks arrived to MAU after calling to report a discharge of mucus and back discomfort  this evening. She has a hx of SAB x5.  Pt report receiving 17P earlier in the pregnancy    History     Patient Active Problem List   Diagnosis Date Noted  . Postcoital bleeding 09/25/2014  . Vaginitis and vulvovaginitis 04/11/2012  . Overweight(278.02) 07/21/2011  . Hidradenitis 07/21/2011  . Vaginal discharge 07/21/2011  . DYSAUTONOMIA 02/24/2009  . HYPOTENSION, ORTHOSTATIC 01/21/2009  . OTHER CONVULSIONS 01/21/2009    Chief Complaint  Patient presents with  . Abdominal Cramping  . Vaginal Discharge   HPI  OB History    Gravida Para Term Preterm AB TAB SAB Ectopic Multiple Living   Past Medical History  Diagnosis Date  . Syncope     neurally-mediated  . Anxiety   . Short PR-normal QRS complex syndrome   . Asthma     sports induced  . Seizures     childhood    Past Surgical History  Procedure Laterality Date  . Wisdom tooth extraction    . Tonsillectomy      Family History  Problem Relation Age of Onset  . Hypertension Father   . Cancer Maternal Aunt   . Diabetes Maternal Grandmother   . Heart disease Maternal Grandmother   . Hypertension Maternal Grandmother   . Heart disease Maternal Grandfather   . Diabetes Paternal Grandmother   . Heart disease Paternal Grandmother   . Cancer Paternal Grandfather     Social History  Substance Use Topics  . Smoking status: Current Every Day Smoker -- 0.50 packs/day  . Smokeless tobacco: Never Used  . Alcohol Use: No    Allergies: Not on File  Prescriptions prior to admission  Medication Sig Dispense Refill Last Dose  . acetaminophen (TYLENOL) 500 MG tablet Take 1,000 mg by mouth every 6 (six) hours as needed for mild pain.   Past Month at Unknown time  . calcium carbonate (TUMS - DOSED IN MG ELEMENTAL CALCIUM) 500 MG chewable tablet Chew 1 tablet by mouth daily.    11/08/2014 at Unknown time  . cyclobenzaprine (FLEXERIL) 10 MG tablet Take 10 mg by mouth 3 (three) times daily as needed for muscle spasms.   Past Week at Unknown time  . Prenat w/o A-FeCbn-DSS-FA-DHA (CITRANATAL HARMONY PO) Take 2 each by mouth daily.   11/08/2014 at Unknown time  . simethicone (MYLICON) 125 MG chewable tablet Chew 125 mg by mouth every 6 (six) hours as needed for flatulence.   11/08/2014 at Unknown time    ROS See HPI above, all other systems are negative  Physical Exam   Blood pressure 127/78, pulse 104, temperature 97.8 F (36.6 C), resp. rate 18, height 5' 6.5" (1.689 m), weight 192 lb 12.8 oz (87.454 kg).  Physical Exam Ext:  WNL ABD: Soft, non tender to palpation, no rebound or guarding SVE: deferred   ED Course  Assessment: IUP at  27.0weeks Membranes: intact FHR: 145 CTX:  none   Plan: [place]  Addylin Manke, CNM, MSN 11/09/2014. 2:32 AM    Addendum 0300 MAU Addendum Note  Results for orders placed or performed during the hospital encounter of 11/09/14 (from the past 24 hour(s))  Urinalysis, Routine w reflex microscopic (not at [facility])     Status: Abnormal   Collection Time: 11/09/14  1:25 AM  Result Value Ref Range   Color, Urine YELLOW YELLOW   APPearance CLEAR CLEAR   Specific Gravity, Urine <1.005 (L) 1.005 - 1.030   pH 5.5 5.0 - 8.0   Glucose, UA NEGATIVE NEGATIVE mg/dL   Hgb urine dipstick NEGATIVE NEGATIVE   Bilirubin Urine NEGATIVE NEGATIVE   Ketones, ur NEGATIVE NEGATIVE mg/dL   Protein, ur NEGATIVE NEGATIVE mg/dL   Urobilinogen, UA 0.2 0.0 - 1.0 mg/dL   Nitrite NEGATIVE NEGATIVE   Leukocytes, UA NEGATIVE NEGATIVE  Amnisure rupture of membrane (rom)not at [facility]     Status: None   Collection Time: 11/09/14  3:10 AM  Result Value Ref Range   Amnisure ROM NEGATIVE   Wet prep, genital     Status: Abnormal   Collection Time: 11/09/14  3:10 AM  Result Value Ref Range   Yeast Wet Prep HPF POC NONE SEEN NONE SEEN   Trich, Wet Prep NONE SEEN  NONE SEEN   Clue Cells Wet Prep HPF POC NONE SEEN NONE SEEN   WBC, Wet Prep HPF POC FEW (A) NONE SEEN   Now ctx q 3, pt aware  A/P IVF bolus Procardia 17P Consulted with Dr. Normand Sloop [place] FHR 125, fluid normal, cervix 2.8cm.  Previous US on 10/17/14 at office reports the cervix L/T/C SSE a large amount of thick white mucusy discharge in the vault VE close GC/CT pending   Nayeliz Hipp, CNM, MSN 11/09/2014. 4:07 AM  MAU Addendum Note  CTX stopped   Plan: -Rx for procardia -Discussed need to follow up in office this week -Bleeding and PTL Precautions -Encouraged to call if any questions or concerns arise prior to next scheduled office visit.  -Discharged to home in stable condition Consulted with Dr. Maureen Ralphs Jatavious Peppard, CNM, MSN 11/09/2014. 6:04 AM

## 2014-11-09 NOTE — Progress Notes (Signed)
V. Standard CNM in to see pt and discuss d/c plan. Written and verbal d/c instructions given and understanding voiced.

## 2014-11-11 LAB — CERVICOVAGINAL ANCILLARY ONLY
CHLAMYDIA, DNA PROBE: NEGATIVE
NEISSERIA GONORRHEA: NEGATIVE

## 2014-11-17 ENCOUNTER — Inpatient Hospital Stay (HOSPITAL_COMMUNITY)
Admission: AD | Admit: 2014-11-17 | Discharge: 2014-11-17 | Disposition: A | Discharge status: Home / Self Care | Payer: Medicaid Other | Source: Ambulatory Visit | Attending: Obstetrics and Gynecology | Admitting: Obstetrics and Gynecology

## 2014-11-17 DIAGNOSIS — Z3A28 28 weeks gestation of pregnancy: Secondary | ICD-10-CM | POA: Diagnosis not present

## 2014-11-17 MED ORDER — BETAMETHASONE SOD PHOS & ACET 6 (3-3) MG/ML IJ SUSP
12.0000 mg | Freq: Once | INTRAMUSCULAR | Status: AC
  Administered 2014-11-17: 12 mg via INTRAMUSCULAR
  Filled 2014-11-17: qty 2

## 2014-11-17 NOTE — MAU Note (Signed)
Patient sent from the office at 28 weeks for a betamethasone injection. Fetus active. Denies pain, bleeding or discharge.

## 2014-11-18 ENCOUNTER — Encounter (HOSPITAL_COMMUNITY): Payer: Self-pay | Admitting: *Deleted

## 2014-11-18 ENCOUNTER — Inpatient Hospital Stay (HOSPITAL_COMMUNITY)
Admission: AD | Admit: 2014-11-18 | Discharge: 2014-11-18 | Disposition: A | Discharge status: Home / Self Care | Payer: Medicaid Other | Source: Ambulatory Visit | Attending: Obstetrics and Gynecology | Admitting: Obstetrics and Gynecology

## 2014-11-18 DIAGNOSIS — O2622 Pregnancy care for patient with recurrent pregnancy loss, second trimester: Secondary | ICD-10-CM | POA: Insufficient documentation

## 2014-11-18 DIAGNOSIS — Z3A27 27 weeks gestation of pregnancy: Secondary | ICD-10-CM | POA: Diagnosis not present

## 2014-11-18 DIAGNOSIS — O26879 Cervical shortening, unspecified trimester: Secondary | ICD-10-CM | POA: Diagnosis present

## 2014-11-18 DIAGNOSIS — Z283 Underimmunization status: Secondary | ICD-10-CM

## 2014-11-18 DIAGNOSIS — Z22330 Carrier of Group B streptococcus: Secondary | ICD-10-CM

## 2014-11-18 DIAGNOSIS — O99820 Streptococcus B carrier state complicating pregnancy: Secondary | ICD-10-CM | POA: Diagnosis not present

## 2014-11-18 DIAGNOSIS — O99332 Smoking (tobacco) complicating pregnancy, second trimester: Secondary | ICD-10-CM | POA: Diagnosis not present

## 2014-11-18 DIAGNOSIS — Z2839 Other underimmunization status: Secondary | ICD-10-CM

## 2014-11-18 DIAGNOSIS — O2620 Pregnancy care for patient with recurrent pregnancy loss, unspecified trimester: Secondary | ICD-10-CM | POA: Diagnosis present

## 2014-11-18 DIAGNOSIS — O26872 Cervical shortening, second trimester: Secondary | ICD-10-CM | POA: Insufficient documentation

## 2014-11-18 DIAGNOSIS — O9989 Other specified diseases and conditions complicating pregnancy, childbirth and the puerperium: Secondary | ICD-10-CM

## 2014-11-18 LAB — URINE MICROSCOPIC-ADD ON

## 2014-11-18 LAB — URINALYSIS, ROUTINE W REFLEX MICROSCOPIC
Bilirubin Urine: NEGATIVE
GLUCOSE, UA: NEGATIVE mg/dL
HGB URINE DIPSTICK: NEGATIVE
KETONES UR: 15 mg/dL — AB
Nitrite: NEGATIVE
PROTEIN: NEGATIVE mg/dL
Specific Gravity, Urine: 1.025 (ref 1.005–1.030)
UROBILINOGEN UA: 0.2 mg/dL (ref 0.0–1.0)
pH: 6 (ref 5.0–8.0)

## 2014-11-18 MED ORDER — BETAMETHASONE SOD PHOS & ACET 6 (3-3) MG/ML IJ SUSP
12.0000 mg | Freq: Once | INTRAMUSCULAR | Status: AC
  Administered 2014-11-18: 12 mg via INTRAMUSCULAR
  Filled 2014-11-18: qty 2

## 2014-11-18 MED ORDER — BETAMETHASONE SOD PHOS & ACET 6 (3-3) MG/ML IJ SUSP
12.5000 mg | Freq: Once | INTRAMUSCULAR | Status: DC
  Filled 2014-11-18: qty 2.1

## 2014-11-18 NOTE — Progress Notes (Signed)
Baby very active and difficult to monitor due to gestation and FM. Trans adjusted

## 2014-11-18 NOTE — Progress Notes (Signed)
Manfred Arch CNM in to discuss written and verbal d/c instructions and understanding voiced.Will start vag supp rather than P17 shots and pt understands

## 2014-11-18 NOTE — Progress Notes (Signed)
Manfred Arch CNM notified of pt's admission and status. Aware 2nd BTMZ given and u/a sent. Aware of ctx pattern. Will see pt

## 2014-11-18 NOTE — Progress Notes (Signed)
Cervix closed by spec exam

## 2014-11-18 NOTE — Discharge Instructions (Signed)
Cervical Insufficiency Cervical insufficiency is when the cervix is weak and starts to open (dilate) and thin (efface) before the pregnancy is at term and without labor starting. This is also called incompetent cervix. It can happen in the second or third trimester when the fetus starts putting pressure on the cervix. Cervical insufficiency can lead to a miscarriage, preterm premature rupture of the membranes (PPROM), or having the baby early (preterm birth).  RISK FACTORS You may be more likely to develop cervical insufficiency if:  You have a shorter cervix than normal.  Damage or injury occurred to your cervix from a past pregnancy or surgery.  You were born with a cervical defect.  You have had a procedure done on the cervix, such as cervical biopsy.  You have a history of cervical insufficiency.  You have a history of PPROM.  You have ended several past pregnancies through abortion.  You were exposed to the drug diethylstilbestrol (DES). SYMPTOMS Often times, women do not have any symptoms. Other times, women may only have mild symptoms that often start between week 14 through 20. The symptoms may last several days or weeks. These symptoms include:  Light spotting or bleeding from the vagina.  Pelvic pressure.  A change in vaginal discharge, such as discharge that changes from clear, white, or light yellow to pink or tan.  Back pain.  Abdominal pain or cramping. DIAGNOSIS Cervical insufficiency cannot be diagnosed before you become pregnant. Once you are pregnant, your health care provider will ask about your medical history and if you have had any problems in past pregnancies. Tell your health care provider about any procedures performed on your cervix or if you have a history of miscarriages or cervical insufficiency. If your health care provider thinks you are at high risk for cervical insufficiency or show signs of cervical insufficiency, he or she may:  Perform a pelvic  exam. This will check for:  The presence of the membranes (amniotic sac) coming out of the cervix.  Cervical abnormalities.  Cervical injuries.  The presence of contractions.  Perform an ultrasonography (commonly called ultrasound) to measure the length and thickness of the cervix. TREATMENT If you have been diagnosed with cervical insufficiency, your health care provider may recommend:  Limiting physical activity.  Bed rest at home or in the hospital.  Pelvic rest, which means no sexual intercourse or placing anything in the vagina.  Cerclage to sew the cervix closed and prevent it from opening too early. The stitches (sutures) are removed between weeks 36 and 38 to avoid problems during labor. Cerclage may be recommended during pregnancy if you have had a history of miscarriages or preterm births without a known cause. It may also be recommended if you have a short cervix that was identified by ultrasound or if your health care provider has found that your cervix has dilated before 24 weeks of pregnancy. Limiting physical activity and bed rest may or may not help prevent a preterm birth. WHEN SHOULD YOU SEEK IMMEDIATE MEDICAL CARE?  Seek immediate medical care if you show any symptoms of cervical insufficiency. You will need to go to the hospital to get checked immediately. Document Released: 02/21/2005 Document Revised: 07/08/2013 Document Reviewed: 04/30/2012 [facility] Patient Information 2015 [place], [place]. This information is not intended to replace advice given to you by your health care provider. Make sure you discuss any questions you have with your health care provider. Progesterone vaginal suppositories What is this medicine? PROGESTERONE (proe JES ter one) is  a female hormone. This medicine is used to treat infertility and to prevent miscarriage in women with a condition called corpus luteum insufficiency. This medicine may also be used to prevent preterm delivery in some  women. The suppositories are only available when compounded by your pharmacist. This medicine may be used for other purposes; ask your health care provider or pharmacist if you have questions. COMMON BRAND NAME(S): First - Progesterone VGS What should I tell my health care provider before I take this medicine? They need to know if you have any of these conditions: -blood vessel disease, blood clotting disorder, or suffered a stroke -breast, cervical or vaginal cancer -heart disease -kidney disease -liver disease -miscarriage or abortion -vaginal bleeding -an unusual or allergic reaction to progesterone, other hormones, medicines, foods, dyes, or preservatives -pregnant or trying to get pregnant -breast-feeding How should I use this medicine? This medicine is for vaginal use only. Do not take by mouth. Follow the directions on the prescription label. Wash your hands before and after use. Take your medicine at regular intervals. Do not take it more often than directed. Do not stop taking except on your doctor's advice. Talk to your pediatrician regarding the use of this medicine in children. Special care may be needed. Overdosage: If you think you have taken too much of this medicine contact a poison control center or emergency room at once. NOTE: This medicine is only for you. Do not share this medicine with others. What if I miss a dose? If you miss a dose, use it as soon as you can. If it is almost time for your next dose, use only that dose. Do not use double or extra doses. What may interact with this medicine? Interactions are not expected. Do not use any other vaginal products without asking your doctor or health care professional. This list may not describe all possible interactions. Give your health care provider a list of all the medicines, herbs, non-prescription drugs, or dietary supplements you use. Also tell them if you smoke, drink alcohol, or use illegal drugs. Some items may  interact with your medicine. What should I watch for while using this medicine? Visit your doctor or health care professional for regular checks on your progress. If your doctor or health care professional instructs you to use any other medicines in the vagina while you are using this medicine, you should separate the doses by at least 6 hours. You may notice a white discharge of medicine while using this medicine. This is normal. If it becomes bothersome, contact your doctor or health care professional. What side effects may I notice from receiving this medicine? Side effects that you should report to your doctor or health care professional as soon as possible: -abnormal vaginal bleeding -allergic reactions like skin rash, itching or hives, swelling of the face, lips, or tongue -breast tissue changes or discharge -changes in vision -chest pain -confusion -dark urine -general ill feeling or flu-like symptoms -light-colored stools -loss of appetite, nausea -pain, swelling, warmth in the leg -right upper belly pain -problems with balance, talking, walking -severe headaches -shortness of breath -sudden numbness or weakness of the face, arm or leg -unusually weak or tired -yellowing of the eyes or skin Side effects that usually do not require medical attention (report to your doctor or health care professional if they continue or are bothersome): -back pain -depressed mood or mood swings -increased appetite -fluid retention and swelling -nausea, vomiting -stomach cramps or bloating This list may not describe  all possible side effects. Call your doctor for medical advice about side effects. You may report side effects to FDA at 1-800-FDA-1088. Where should I keep my medicine? Keep out of the reach of children. Store in the refrigerator between 2 and 8 degrees C (36 and 46 degrees F). Do not freeze. Protect from light. Keep this medicine in the orginal container until ready to use. Throw  away any unused medicine after the expiration date. NOTE: This sheet is a summary. It may not cover all possible information. If you have questions about this medicine, talk to your doctor, pharmacist, or health care provider.  2015, [employer]. (2007-09-24 17:07:09)

## 2014-11-18 NOTE — MAU Provider Note (Signed)
  History   24 yo G6P0050 at 61 5/7 weeks presented for 2nd dose of betamethasone--received 1st dose yesterday in MAU s/p dx of short cervix on [place] in the office.  Cervical length 0.808 (had been 2.8 on 11/09/14).  Plan to restart vaginal progesterone--received single dose of 17P in the office.    Reports had some cramping last night after office visit and 1st dose betamethasone.  Denies leaking or bleeding, reports +FM.  Will monitor for contractions and evaluate cervix by speculum exam in MAU.  Patient Active Problem List   Diagnosis Date Noted  . Short cervix affecting pregnancy 11/18/2014  . 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 Rubella non-immune status, antepartum 11/18/2014  . 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 Postcoital bleeding 09/25/2014  . Overweight(278.02) 07/21/2011  . Hidradenitis 07/21/2011  . DYSAUTONOMIA 02/24/2009  . HYPOTENSION, ORTHOSTATIC 01/21/2009  . OTHER CONVULSIONS 01/21/2009    Chief Complaint  Patient presents with  . Abdominal Pain   HPI:  See above  OB History    Gravida Para Term Preterm AB TAB SAB Ectopic Multiple Living   Past Medical History  Diagnosis Date  . Syncope     neurally-mediated  . Anxiety   . Short PR-normal QRS complex syndrome   . Asthma     sports induced  . Seizures     childhood    Past Surgical History  Procedure Laterality Date  . Wisdom tooth extraction    . Tonsillectomy      Family History  Problem Relation Age of Onset  . Hypertension Father   . Cancer Maternal Aunt   . Diabetes Maternal Grandmother   . Heart disease Maternal Grandmother   . Hypertension Maternal Grandmother   . Heart disease Maternal Grandfather   . Diabetes Paternal Grandmother   . Heart disease Paternal Grandmother   . Cancer Paternal Grandfather     Social History  Substance Use Topics  . Smoking status: Current Every Day Smoker -- 0.50 packs/day  . Smokeless tobacco: Never  Used  . Alcohol Use: No    Allergies: No Known Allergies  No prescriptions prior to admission    ROS:  Occasional cramping last night, +FM. Physical Exam   Blood pressure 118/73, pulse 98, temperature 98.1 F (36.7 C), temperature source Oral, resp. rate 18.  Physical Exam  Chest clear Heart RRR without murmur Abd gravid, NT Pelvic--small amount creamy d/c in vault, cervix appears closed, approx 50%. Ext WNL  FHR Category 1 UCs none to palpation, much FM.  Frequent sharp changes in toco reading, but never associated with palpable contractions.  ED Course  Assessment: IUP at 27 5/7 weeks Short cervix--stable Hx recurrent SABs Completed betamethasone course  Plan: D/C home with instructions to continue bedrest, Procardia q 6 hours. Progesterone suppositories 25 mg per vagina BID (if significant irritation occurs, patient will notify [facility]). Keep scheduled appt at [facility] 9/20, call prn.   Nigel Bridgeman CNM, MSN 11/18/2014 5:41 PM

## 2014-11-18 NOTE — Progress Notes (Signed)
EFM strip shows abrupt spikes from toco about every min last few secs. Baby active --toco readjust and no uterine ctxs palpated

## 2014-11-18 NOTE — MAU Note (Addendum)
Pt here for 2nd betamethasone injection, also having cramping & pressure.  Hx of short cervix.  Denies bleeding, LOF, or discharge.  Pt taking procardia every 6 hours, dose due @ 1230.

## 2014-11-26 ENCOUNTER — Inpatient Hospital Stay (HOSPITAL_COMMUNITY)
Admission: AD | Admit: 2014-11-26 | Discharge: 2014-11-26 | Disposition: A | Discharge status: Home / Self Care | Payer: Medicaid Other | Source: Ambulatory Visit | Attending: Obstetrics and Gynecology | Admitting: Obstetrics and Gynecology

## 2014-11-26 ENCOUNTER — Encounter (HOSPITAL_COMMUNITY): Payer: Self-pay | Admitting: *Deleted

## 2014-11-26 DIAGNOSIS — Z3A28 28 weeks gestation of pregnancy: Secondary | ICD-10-CM | POA: Insufficient documentation

## 2014-11-26 DIAGNOSIS — F17210 Nicotine dependence, cigarettes, uncomplicated: Secondary | ICD-10-CM | POA: Diagnosis not present

## 2014-11-26 DIAGNOSIS — O99333 Smoking (tobacco) complicating pregnancy, third trimester: Secondary | ICD-10-CM | POA: Insufficient documentation

## 2014-11-26 DIAGNOSIS — O26873 Cervical shortening, third trimester: Secondary | ICD-10-CM | POA: Insufficient documentation

## 2014-11-26 LAB — URINALYSIS, ROUTINE W REFLEX MICROSCOPIC
Bilirubin Urine: NEGATIVE
Glucose, UA: NEGATIVE mg/dL
Hgb urine dipstick: NEGATIVE
Ketones, ur: NEGATIVE mg/dL
LEUKOCYTES UA: NEGATIVE
NITRITE: NEGATIVE
PH: 6 (ref 5.0–8.0)
Protein, ur: NEGATIVE mg/dL
UROBILINOGEN UA: 0.2 mg/dL (ref 0.0–1.0)

## 2014-11-26 LAB — WET PREP, GENITAL
CLUE CELLS WET PREP: NONE SEEN
TRICH WET PREP: NONE SEEN
YEAST WET PREP: NONE SEEN

## 2014-11-26 NOTE — MAU Provider Note (Signed)
History    Crystal Newman is a 24y.o. G6P0 at 28.6wks who presents, after phone call, for contractions.  Patient states she has been taking her procardia and had her last dose at 1830.  Patient reports contractions have improved, but that she is having lower abdominal pain and pressure.  Patient also reports vaginal discharge that has resulted in changing her undergarments x two.  However, patient is also taking vaginal progesterone twice daily.  Patient denies VB and reports active fetus.   Patient Active Problem List   Diagnosis Date Noted  . Short cervix affecting pregnancy 11/18/2014  . 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 Rubella non-immune status, antepartum 11/18/2014  . 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 Postcoital bleeding 09/25/2014  . Overweight(278.02) 07/21/2011  . Hidradenitis 07/21/2011  . DYSAUTONOMIA 02/24/2009  . HYPOTENSION, ORTHOSTATIC 01/21/2009  . OTHER CONVULSIONS 01/21/2009    No chief complaint on file.  HPI  OB History    Gravida Para Term Preterm AB TAB SAB Ectopic Multiple Living   Past Medical History  Diagnosis Date  . Syncope     neurally-mediated  . Anxiety   . Short PR-normal QRS complex syndrome   . Asthma     sports induced  . Seizures     childhood    Past Surgical History  Procedure Laterality Date  . Wisdom tooth extraction    . Tonsillectomy      Family History  Problem Relation Age of Onset  . Hypertension Father   . Cancer Maternal Aunt   . Diabetes Maternal Grandmother   . Heart disease Maternal Grandmother   . Hypertension Maternal Grandmother   . Heart disease Maternal Grandfather   . Diabetes Paternal Grandmother   . Heart disease Paternal Grandmother   . Cancer Paternal Grandfather     Social History  Substance Use Topics  . Smoking status: Current Every Day Smoker -- 0.50 packs/day  . Smokeless tobacco: Never Used  . Alcohol Use: No    Allergies: No Known  Allergies  Prescriptions prior to admission  Medication Sig Dispense Refill Last Dose  . acetaminophen (TYLENOL) 500 MG tablet Take 1,000 mg by mouth every 6 (six) hours as needed for mild pain.   Past Month at Unknown time  . calcium carbonate (TUMS - DOSED IN MG ELEMENTAL CALCIUM) 500 MG chewable tablet Chew 1 tablet by mouth daily as needed for indigestion or heartburn.    Past Month at Unknown time  . cyclobenzaprine (FLEXERIL) 10 MG tablet Take 10 mg by mouth 3 (three) times daily as needed for muscle spasms.   Past Month at Unknown time  . NIFEdipine (PROCARDIA) 10 MG capsule Take 1 capsule (10 mg total) by mouth every 6 (six) hours as needed (x 3 doses). (Patient taking differently: Take 10 mg by mouth every 6 (six) hours as needed (for contractions). ) 30 capsule 1 11/18/2014 at 1300  . Prenat w/o A-FeCbn-DSS-FA-DHA (CITRANATAL HARMONY PO) Take 2 each by mouth daily.   Past Week at Unknown time  . promethazine (PHENERGAN) 25 MG tablet Take 25 mg by mouth every 6 (six) hours as needed for nausea or vomiting.   Past Week at Unknown time    ROS  See HPI Above Physical Exam   Blood pressure 123/78, pulse 118, temperature 99.2 F (37.3 C), temperature source Oral, resp. rate 16, height  (1.702 m), weight 85.73  kg (189 lb), SpO2 99 %.  No results found for this or any previous visit (from the past 24 hour(s)).  Physical Exam  Vitals reviewed. Constitutional: She is oriented to person, place, and time. She appears well-developed and well-nourished. No distress.  HENT:  Head: Normocephalic and atraumatic.  Eyes: EOM are normal. Pupils are equal, round, and reactive to light.  Neck: Normal range of motion.  Cardiovascular: Normal rate and regular rhythm.   Respiratory: Effort normal.  GI: Soft.  Genitourinary: Uterus is enlarged. Cervix exhibits no motion tenderness. Vaginal discharge found.  Sterile Speculum Exam: -Vulva: Thin white discharge noted at introitus -Vaginal Vault:  Thick white discharged noted in vault -wet prep collected -Cervix: Appears closed, no discharge from os Bimanual Exam: Closed/Thick/Ballotable  Musculoskeletal: Normal range of motion. She exhibits no edema.  Neurological: She is alert and oriented to person, place, and time.  Skin: Skin is warm and dry.  Psychiatric: She has a normal mood and affect. Her behavior is normal.     FHR:145 bpm, Mod Var, -Decels, +Accels UC: Irritability noted ED Course  Assessment: IUP at 29wks Reassuring FHT Contractions Vaginal Pressure  Plan: -PE as above -Labs: Wet prep, UA -Discussed PT contractions and mgmt  Follow Up (2234) -Nurse called informs wet prep negative and UA WNL -Cat I FT -Nurse instructed to perform repeat VE and discharge to home if stable   EMLY, JESSICA LYNN CNM, MSN 11/26/2014 9:43 PM

## 2014-11-26 NOTE — MAU Note (Signed)
Pt reports she has had cramping and pressure off/on all day and at 1830 she started ? Leaking fluid.

## 2014-11-26 NOTE — Discharge Instructions (Signed)
Preterm Labor Information °Preterm labor is when labor starts at less than 37 weeks of pregnancy. The normal length of a pregnancy is 39 to 41 weeks. °CAUSES °Often, there is no identifiable underlying cause as to why a woman goes into preterm labor. One of the most common known causes of preterm labor is infection. Infections of the uterus, cervix, vagina, amniotic sac, bladder, kidney, or even the lungs (pneumonia) can cause labor to start. Other suspected causes of preterm labor include:  °· Urogenital infections, such as yeast infections and bacterial vaginosis.   °· Uterine abnormalities (uterine shape, uterine septum, fibroids, or bleeding from the placenta).   °· A cervix that has been operated on (it may fail to stay closed).   °· Malformations in the fetus.   °· Multiple gestations (twins, triplets, and so on).   °· Breakage of the amniotic sac.   °RISK FACTORS °· Having a previous history of preterm labor.   °· Having premature rupture of membranes (PROM).   °· Having a placenta that covers the opening of the cervix (placenta previa).   °· Having a placenta that separates from the uterus (placental abruption).   °· Having a cervix that is too weak to hold the fetus in the uterus (incompetent cervix).   °· Having too much fluid in the amniotic sac (polyhydramnios).   °· Taking illegal drugs or smoking while pregnant.   °· Not gaining enough weight while pregnant.   °· Being younger than 18 and older than 26 years old.   °· Having a low socioeconomic status.   °· Being African American. °SYMPTOMS °Signs and symptoms of preterm labor include:  °· Menstrual-like cramps, abdominal pain, or back pain. °· Uterine contractions that are regular, as frequent as six in an hour, regardless of their intensity (may be mild or painful). °· Contractions that start on the top of the uterus and spread down to the lower abdomen and back.   °· A sense of increased pelvic pressure.   °· A watery or bloody mucus discharge that  comes from the vagina.   °TREATMENT °Depending on the length of the pregnancy and other circumstances, your health care provider may suggest bed rest. If necessary, there are medicines that can be given to stop contractions and to mature the fetal lungs. If labor happens before 34 weeks of pregnancy, a prolonged hospital stay may be recommended. Treatment depends on the condition of both you and the fetus.  °WHAT SHOULD YOU DO IF YOU THINK YOU ARE IN PRETERM LABOR? °Call your health care provider right away. You will need to go to the hospital to get checked immediately. °HOW CAN YOU PREVENT PRETERM LABOR IN FUTURE PREGNANCIES? °You should:  °· Stop smoking if you smoke.  °· Maintain healthy weight gain and avoid chemicals and drugs that are not necessary. °· Be watchful for any type of infection. °· Inform your health care provider if you have a known history of preterm labor. °Document Released: 05/14/2003 Document Revised: 10/24/2012 Document Reviewed: 03/26/2012 °ExitCare® Patient Information ©2015 [employer]. This information is not intended to replace advice given to you by your health care provider. Make sure you discuss any questions you have with your health care provider. °Fetal Movement Counts °Patient Name: __________________________________________________ Patient Due Date: ____________________ °Performing a fetal movement count is highly recommended in high-risk pregnancies, but it is good for every pregnant woman to do. Your health care provider may ask you to start counting fetal movements at 28 weeks of the pregnancy. Fetal movements often increase: °· After eating a full meal. °· After physical activity. °· After   eating or drinking something sweet or cold. °· At rest. °Pay attention to when you feel the baby is most active. This will help you notice a pattern of your baby's sleep and wake cycles and what factors contribute to an increase in fetal movement. It is important to perform a fetal  movement count at the same time each day when your baby is normally most active.  °HOW TO COUNT FETAL MOVEMENTS °· Find a quiet and comfortable area to sit or lie down on your left side. Lying on your left side provides the best blood and oxygen circulation to your baby. °· Write down the day and time on a sheet of paper or in a journal. °· Start counting kicks, flutters, swishes, rolls, or jabs in a 2-hour period. You should feel at least 10 movements within 2 hours. °· If you do not feel 10 movements in 2 hours, wait 2-3 hours and count again. Look for a change in the pattern or not enough counts in 2 hours. °SEEK MEDICAL CARE IF: °· You feel less than 10 counts in 2 hours, tried twice. °· There is no movement in over an hour. °· The pattern is changing or taking longer each day to reach 10 counts in 2 hours. °· You feel the baby is not moving as he or she usually does. °Date: ____________ Movements: ____________ Start time: ____________ Finish time: ____________  °Date: ____________ Movements: ____________ Start time: ____________ Finish time: ____________ °Date: ____________ Movements: ____________ Start time: ____________ Finish time: ____________ °Date: ____________ Movements: ____________ Start time: ____________ Finish time: ____________ °Date: ____________ Movements: ____________ Start time: ____________ Finish time: ____________ °Date: ____________ Movements: ____________ Start time: ____________ Finish time: ____________ °Date: ____________ Movements: ____________ Start time: ____________ Finish time: ____________ °Date: ____________ Movements: ____________ Start time: ____________ Finish time: ____________  °Date: ____________ Movements: ____________ Start time: ____________ Finish time: ____________ °Date: ____________ Movements: ____________ Start time: ____________ Finish time: ____________ °Date: ____________ Movements: ____________ Start time: ____________ Finish time: ____________ °Date:  ____________ Movements: ____________ Start time: ____________ Finish time: ____________ °Date: ____________ Movements: ____________ Start time: ____________ Finish time: ____________ °Date: ____________ Movements: ____________ Start time: ____________ Finish time: ____________ °Date: ____________ Movements: ____________ Start time: ____________ Finish time: ____________  °Date: ____________ Movements: ____________ Start time: ____________ Finish time: ____________ °Date: ____________ Movements: ____________ Start time: ____________ Finish time: ____________ °Date: ____________ Movements: ____________ Start time: ____________ Finish time: ____________ °Date: ____________ Movements: ____________ Start time: ____________ Finish time: ____________ °Date: ____________ Movements: ____________ Start time: ____________ Finish time: ____________ °Date: ____________ Movements: ____________ Start time: ____________ Finish time: ____________ °Date: ____________ Movements: ____________ Start time: ____________ Finish time: ____________  °Date: ____________ Movements: ____________ Start time: ____________ Finish time: ____________ °Date: ____________ Movements: ____________ Start time: ____________ Finish time: ____________ °Date: ____________ Movements: ____________ Start time: ____________ Finish time: ____________ °Date: ____________ Movements: ____________ Start time: ____________ Finish time: ____________ °Date: ____________ Movements: ____________ Start time: ____________ Finish time: ____________ °Date: ____________ Movements: ____________ Start time: ____________ Finish time: ____________ °Date: ____________ Movements: ____________ Start time: ____________ Finish time: ____________  °Date: ____________ Movements: ____________ Start time: ____________ Finish time: ____________ °Date: ____________ Movements: ____________ Start time: ____________ Finish time: ____________ °Date: ____________ Movements: ____________ Start  time: ____________ Finish time: ____________ °Date: ____________ Movements: ____________ Start time: ____________ Finish time: ____________ °Date: ____________ Movements: ____________ Start time: ____________ Finish time: ____________ °Date: ____________ Movements: ____________ Start time: ____________ Finish time: ____________ °Date: ____________ Movements: ____________ Start time: ____________ Finish time: ____________  °Date:   ____________ Movements: ____________ Start time: ____________ Finish time: ____________ °Date: ____________ Movements: ____________ Start time: ____________ Finish time: ____________ °Date: ____________ Movements: ____________ Start time: ____________ Finish time: ____________ °Date: ____________ Movements: ____________ Start time: ____________ Finish time: ____________ °Date: ____________ Movements: ____________ Start time: ____________ Finish time: ____________ °Date: ____________ Movements: ____________ Start time: ____________ Finish time: ____________ °Date: ____________ Movements: ____________ Start time: ____________ Finish time: ____________  °Date: ____________ Movements: ____________ Start time: ____________ Finish time: ____________ °Date: ____________ Movements: ____________ Start time: ____________ Finish time: ____________ °Date: ____________ Movements: ____________ Start time: ____________ Finish time: ____________ °Date: ____________ Movements: ____________ Start time: ____________ Finish time: ____________ °Date: ____________ Movements: ____________ Start time: ____________ Finish time: ____________ °Date: ____________ Movements: ____________ Start time: ____________ Finish time: ____________ °Date: ____________ Movements: ____________ Start time: ____________ Finish time: ____________  °Date: ____________ Movements: ____________ Start time: ____________ Finish time: ____________ °Date: ____________ Movements: ____________ Start time: ____________ Finish time:  ____________ °Date: ____________ Movements: ____________ Start time: ____________ Finish time: ____________ °Date: ____________ Movements: ____________ Start time: ____________ Finish time: ____________ °Date: ____________ Movements: ____________ Start time: ____________ Finish time: ____________ °Date: ____________ Movements: ____________ Start time: ____________ Finish time: ____________ °Document Released: 03/23/2006 Document Revised: 07/08/2013 Document Reviewed: 12/19/2011 °ExitCare® Patient Information ©2015 [employer]. This information is not intended to replace advice given to you by your health care provider. Make sure you discuss any questions you have with your health care provider. ° °

## 2015-01-08 ENCOUNTER — Encounter (HOSPITAL_COMMUNITY): Payer: Self-pay | Admitting: *Deleted

## 2015-01-08 ENCOUNTER — Inpatient Hospital Stay (HOSPITAL_COMMUNITY)
Admission: AD | Admit: 2015-01-08 | Discharge: 2015-01-08 | Disposition: A | Discharge status: Home / Self Care | Payer: Medicaid Other | Source: Ambulatory Visit | Attending: Obstetrics and Gynecology | Admitting: Obstetrics and Gynecology

## 2015-01-08 DIAGNOSIS — R300 Dysuria: Secondary | ICD-10-CM | POA: Diagnosis not present

## 2015-01-08 DIAGNOSIS — O98313 Other infections with a predominantly sexual mode of transmission complicating pregnancy, third trimester: Secondary | ICD-10-CM | POA: Diagnosis not present

## 2015-01-08 DIAGNOSIS — B009 Herpesviral infection, unspecified: Secondary | ICD-10-CM

## 2015-01-08 DIAGNOSIS — O1203 Gestational edema, third trimester: Secondary | ICD-10-CM | POA: Diagnosis present

## 2015-01-08 DIAGNOSIS — A6000 Herpesviral infection of urogenital system, unspecified: Secondary | ICD-10-CM | POA: Insufficient documentation

## 2015-01-08 DIAGNOSIS — Z3A35 35 weeks gestation of pregnancy: Secondary | ICD-10-CM | POA: Diagnosis not present

## 2015-01-08 DIAGNOSIS — R339 Retention of urine, unspecified: Secondary | ICD-10-CM | POA: Diagnosis not present

## 2015-01-08 DIAGNOSIS — O26893 Other specified pregnancy related conditions, third trimester: Secondary | ICD-10-CM | POA: Insufficient documentation

## 2015-01-08 HISTORY — DX: Unspecified infectious disease: B99.9

## 2015-01-08 LAB — URINALYSIS, ROUTINE W REFLEX MICROSCOPIC
Bilirubin Urine: NEGATIVE
GLUCOSE, UA: NEGATIVE mg/dL
Hgb urine dipstick: NEGATIVE
Ketones, ur: NEGATIVE mg/dL
LEUKOCYTES UA: NEGATIVE
NITRITE: NEGATIVE
PROTEIN: NEGATIVE mg/dL
Specific Gravity, Urine: 1.025 (ref 1.005–1.030)
Urobilinogen, UA: 0.2 mg/dL (ref 0.0–1.0)
pH: 6 (ref 5.0–8.0)

## 2015-01-08 NOTE — Discharge Instructions (Signed)

## 2015-01-08 NOTE — MAU Provider Note (Signed)
History   24 yo, G6P0050, [id number] presents to MAU complaining of increased swelling in her hands and feet.  Also states that she has urinated three times today and she is not "peeing like I should, only dripples of pee come out".  [facility]. PIH labs obtained at visit for protein in her urine and elevated blood pressure.  Pt called [facility] offices earlier today and was direct to be evaluated in the MAU.  Currently denies HA, visual disturbances or epigastric pain.   Patient Active Problem List   Diagnosis Date Noted  . 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 Short cervix affecting pregnancy 11/18/2014  . 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 Rubella non-immune status, antepartum 11/18/2014  . 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 Postcoital bleeding 09/25/2014  . Overweight(278.02) 07/21/2011  . Hidradenitis 07/21/2011  . DYSAUTONOMIA 02/24/2009  . HYPOTENSION, ORTHOSTATIC 01/21/2009  . OTHER CONVULSIONS 01/21/2009    Chief Complaint  Patient presents with  . Leg Swelling   HPI  OB History    Gravida Para Term Preterm AB TAB SAB Ectopic Multiple Living   6    5  5          Past Medical History  Diagnosis Date  . Syncope     neurally-mediated  . Anxiety   . Short PR-normal QRS complex syndrome   . Asthma     sports induced  . Infection     UTI-frequent  . Seizures (HCC)     unknown cause, last at age 24    Past Surgical History  Procedure Laterality Date  . Wisdom tooth extraction    . Tonsillectomy      Family History  Problem Relation Age of Onset  . Hypertension Father   . Cancer Maternal Aunt     ovarian  . Diabetes Maternal Grandmother   . Heart disease Maternal Grandmother   . Hypertension Maternal Grandmother   . Heart disease Maternal Grandfather   . Diabetes Paternal Grandmother   . Heart disease Paternal Grandmother   . Cancer Paternal Grandfather     lung    Social History   Substance Use Topics  . Smoking status: Current Every Day Smoker -- 0.50 packs/day for 8 years  . Smokeless tobacco: Never Used  . Alcohol Use: No    Allergies: No Known Allergies  Prescriptions prior to admission  Medication Sig Dispense Refill Last Dose  . flintstones complete (FLINTSTONES) 60 MG chewable tablet Chew 1 tablet by mouth daily.   11/26/2014 at Unknown time  . NIFEdipine (PROCARDIA) 10 MG capsule Take 1 capsule (10 mg total) by mouth every 6 (six) hours as needed (x 3 doses). (Patient taking differently: Take 10 mg by mouth every 6 (six) hours as needed (for contractions). ) 30 capsule 1 11/26/2014 at Unknown time  . Progesterone 25 MG SUPP Place 1 suppository vaginally daily.   11/26/2014 at Unknown time    Review of Systems  Constitutional: Negative.   HENT: Negative.   Eyes: Negative.   Respiratory: Negative.   Cardiovascular:       Reports swelling of hand and feet  Gastrointestinal: Negative.   Genitourinary: Positive for dysuria.  Skin: Negative.   Neurological: Negative.   Endo/Heme/Allergies: Negative.   Psychiatric/Behavioral: Negative.    Physical Exam   Blood pressure 103/90, pulse 102, temperature 98.4 F (36.9 C), temperature source Oral, resp. rate 18.  Physical Exam  Constitutional: She is oriented to person,  place, and time. She appears well-developed and well-nourished.  HENT:  Head: Normocephalic and atraumatic.  Eyes: Pupils are equal, round, and reactive to light.  Neck: Normal range of motion. Neck supple.  Cardiovascular: Normal rate and regular rhythm.   Respiratory: Effort normal and breath sounds normal.  GI: Soft. Bowel sounds are normal.  Musculoskeletal: Normal range of motion. She exhibits no edema.  Neurological: She is alert and oriented to person, place, and time. She has normal reflexes.  Skin: Skin is warm and dry.  Psychiatric: She has a normal mood and affect. Her behavior is normal. Judgment and thought content normal.     FHT -    Cat 1 tracing, 145 bpm, moderate variability, +accels, no decels UC: none     ED Course  Assessment: 24 yo, G6P50050 NO edema noted Dysuria  Cat 1 tracing  Plan: UA  Bladder scan    Alphonzo Severance CNM, MSN 01/08/2015 6:56 PM   Addendum:  Nurse call reports bladder scan with >670mL.  In room to assess.  Patient reports as above.  Denies other issues with urination and PIH symptoms.  States swelling is "not that bad." O:  Filed Vitals:   01/08/15 1839  BP: 103/90  Pulse: 102  Temp:   Resp:      Results for orders placed or performed during the hospital encounter of 01/08/15 (from the past 24 hour(s))  Urinalysis, Routine w reflex microscopic (not at [facility])     Status: None   Collection Time: 01/08/15  6:55 PM  Result Value Ref Range   Color, Urine YELLOW YELLOW   APPearance CLEAR CLEAR   Specific Gravity, Urine 1.025 1.005 - 1.030   pH 6.0 5.0 - 8.0   Glucose, UA NEGATIVE NEGATIVE mg/dL   Hgb urine dipstick NEGATIVE NEGATIVE   Bilirubin Urine NEGATIVE NEGATIVE   Ketones, ur NEGATIVE NEGATIVE mg/dL   Protein, ur NEGATIVE NEGATIVE mg/dL   Urobilinogen, UA 0.2 0.0 - 1.0 mg/dL   Nitrite NEGATIVE NEGATIVE   Leukocytes, UA NEGATIVE NEGATIVE    A: Urinary Retention  P: Straight cath  Send UA Urine Culture, if appropriate Educated regarding proper hydration and things that can result in urinary retention including but not limited to infection,obstruction of urethra, kidney issues.  Update at 2035 Straight Cath with ~ UA as above Reiterated need for proper hydration Informed of pending UC from office-will contact with any abnormal results Labor and PIH Precautions Encouraged to call if any questions or concerns arise prior to next scheduled office visit.  Keep appt as scheduled: 01/12/2015 Discharged to home in stable condition  Marlene Bast MSN, CNM 01/08/2015 8:46 PM

## 2015-01-08 NOTE — MAU Note (Signed)
Routine apt yesterday, +protein in urine. labwork was done. Today noted  Swelling in hands and feet, has only urinated a few times today-very small amounts, called office- was told to come in for eval

## 2015-01-08 NOTE — MAU Note (Signed)
Urine sent to lab 

## 2015-01-10 LAB — CULTURE, OB URINE
Culture: NO GROWTH
Special Requests: NORMAL

## 2015-01-31 ENCOUNTER — Inpatient Hospital Stay (HOSPITAL_COMMUNITY)
Admission: AD | Admit: 2015-01-31 | Discharge: 2015-01-31 | Disposition: A | Discharge status: Home / Self Care | Payer: Medicaid Other | Source: Ambulatory Visit | Attending: Obstetrics & Gynecology | Admitting: Obstetrics & Gynecology

## 2015-01-31 ENCOUNTER — Encounter (HOSPITAL_COMMUNITY): Payer: Self-pay

## 2015-01-31 DIAGNOSIS — O99820 Streptococcus B carrier state complicating pregnancy: Secondary | ICD-10-CM | POA: Diagnosis not present

## 2015-01-31 DIAGNOSIS — R600 Localized edema: Secondary | ICD-10-CM | POA: Diagnosis not present

## 2015-01-31 DIAGNOSIS — F419 Anxiety disorder, unspecified: Secondary | ICD-10-CM | POA: Insufficient documentation

## 2015-01-31 DIAGNOSIS — O99333 Smoking (tobacco) complicating pregnancy, third trimester: Secondary | ICD-10-CM | POA: Insufficient documentation

## 2015-01-31 DIAGNOSIS — Z833 Family history of diabetes mellitus: Secondary | ICD-10-CM | POA: Insufficient documentation

## 2015-01-31 DIAGNOSIS — Z3A38 38 weeks gestation of pregnancy: Secondary | ICD-10-CM | POA: Diagnosis not present

## 2015-01-31 DIAGNOSIS — O99343 Other mental disorders complicating pregnancy, third trimester: Secondary | ICD-10-CM | POA: Insufficient documentation

## 2015-01-31 DIAGNOSIS — Z8041 Family history of malignant neoplasm of ovary: Secondary | ICD-10-CM | POA: Insufficient documentation

## 2015-01-31 DIAGNOSIS — R030 Elevated blood-pressure reading, without diagnosis of hypertension: Secondary | ICD-10-CM | POA: Insufficient documentation

## 2015-01-31 DIAGNOSIS — Z801 Family history of malignant neoplasm of trachea, bronchus and lung: Secondary | ICD-10-CM | POA: Diagnosis not present

## 2015-01-31 DIAGNOSIS — O26893 Other specified pregnancy related conditions, third trimester: Secondary | ICD-10-CM | POA: Insufficient documentation

## 2015-01-31 DIAGNOSIS — O26879 Cervical shortening, unspecified trimester: Secondary | ICD-10-CM

## 2015-01-31 DIAGNOSIS — Z8249 Family history of ischemic heart disease and other diseases of the circulatory system: Secondary | ICD-10-CM | POA: Diagnosis not present

## 2015-01-31 LAB — PROTEIN / CREATININE RATIO, URINE
CREATININE, URINE: 97 mg/dL
PROTEIN CREATININE RATIO: 0.07 mg/mg{creat} (ref 0.00–0.15)
Total Protein, Urine: 7 mg/dL

## 2015-01-31 LAB — COMPREHENSIVE METABOLIC PANEL
ALBUMIN: 2.8 g/dL — AB (ref 3.5–5.0)
ALK PHOS: 155 U/L — AB (ref 38–126)
ALT: 10 U/L — ABNORMAL LOW (ref 14–54)
ANION GAP: 7 (ref 5–15)
AST: 15 U/L (ref 15–41)
BILIRUBIN TOTAL: 0.4 mg/dL (ref 0.3–1.2)
BUN: 8 mg/dL (ref 6–20)
CHLORIDE: 106 mmol/L (ref 101–111)
CO2: 23 mmol/L (ref 22–32)
Calcium: 8.9 mg/dL (ref 8.9–10.3)
Creatinine, Ser: 0.48 mg/dL (ref 0.44–1.00)
GFR calc Af Amer: >60 mL/min (ref >60)
GFR calc non Af Amer: >60 mL/min (ref >60)
Glucose, Bld: 111 mg/dL — ABNORMAL HIGH (ref 65–99)
Potassium: 3.6 mmol/L (ref 3.5–5.1)
Sodium: 136 mmol/L (ref 135–145)
TOTAL PROTEIN: 6.6 g/dL (ref 6.5–8.1)

## 2015-01-31 LAB — CBC
HEMATOCRIT: 35.3 % — AB (ref 36.0–46.0)
HEMOGLOBIN: 11.6 g/dL — AB (ref 12.0–15.0)
MCH: 30.4 pg (ref 26.0–34.0)
MCHC: 32.9 g/dL (ref 30.0–36.0)
MCV: 92.7 fL (ref 78.0–100.0)
Platelets: 188 10*3/uL (ref 150–400)
RBC: 3.81 MIL/uL — ABNORMAL LOW (ref 3.87–5.11)
RDW: 14.1 % (ref 11.5–15.5)
WBC: 16 10*3/uL — ABNORMAL HIGH (ref 4.0–10.5)

## 2015-01-31 LAB — URINE MICROSCOPIC-ADD ON: RBC / HPF: NONE SEEN RBC/hpf (ref 0–5)

## 2015-01-31 LAB — URIC ACID: URIC ACID, SERUM: 4.5 mg/dL (ref 2.3–6.6)

## 2015-01-31 LAB — URINALYSIS, ROUTINE W REFLEX MICROSCOPIC
Bilirubin Urine: NEGATIVE
Glucose, UA: NEGATIVE mg/dL
HGB URINE DIPSTICK: NEGATIVE
Ketones, ur: NEGATIVE mg/dL
Nitrite: NEGATIVE
PH: 6 (ref 5.0–8.0)
Protein, ur: NEGATIVE mg/dL
SPECIFIC GRAVITY, URINE: 1.015 (ref 1.005–1.030)

## 2015-01-31 LAB — LACTATE DEHYDROGENASE: LDH: 145 U/L (ref 98–192)

## 2015-01-31 LAB — AMNISURE RUPTURE OF MEMBRANE (ROM) NOT AT ARMC: AMNISURE: NEGATIVE

## 2015-01-31 NOTE — Discharge Instructions (Signed)

## 2015-01-31 NOTE — MAU Note (Signed)
Leaking fld since 0830. Clear fld. Never a lot on panties at a time but just staying wet. Feet and ankles swelling and moving up legs. No pain

## 2015-02-01 DIAGNOSIS — O26879 Cervical shortening, unspecified trimester: Secondary | ICD-10-CM

## 2015-02-01 NOTE — MAU Provider Note (Signed)
History   24 yo G6P0050 at 14 2/7 weeks presented to MAU for evaluation of ? Leaking.  Denies significant contractions, reports +FM.  Denies current HA, visual sx, or epigastric pain, does note pedal edema.  BP at last office visit on 01/31/15 100/70, cervix 2 cm, 50%, vtx, -2.  Hx syncopal episodes in past, none during this pregnancy.  Short cervix noted at 27 weeks to nadir of 0.808 cm, on progesterone vaginal supp during pregnancy, was on Procardia prn.  Rx Fioricet on 11/21 due to HAs not responsive to Tylenol.  Patient Active Problem List   Diagnosis Date Noted  . Short cervix, antepartum 02/01/2015  . 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 Short cervix affecting pregnancy 11/18/2014  . 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 Rubella non-immune status, antepartum 11/18/2014  . 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 Hidradenitis 07/21/2011  . HYPOTENSION, ORTHOSTATIC 01/21/2009    Chief Complaint  Patient presents with  . Rupture of Membranes  . Leg Swelling   HPI:  As above  OB History    Gravida Para Term Preterm AB TAB SAB Ectopic Multiple Living   previous early SABs.  Past Medical History  Diagnosis Date  . Syncope     neurally-mediated  . Anxiety   . Short PR-normal QRS complex syndrome   . Asthma     sports induced  . Infection     UTI-frequent  . Seizures (HCC)     unknown cause, last at age 67    Past Surgical History  Procedure Laterality Date  . Wisdom tooth extraction    . Tonsillectomy      Family History  Problem Relation Age of Onset  . Hypertension Father   . Cancer Maternal Aunt     ovarian  . Diabetes Maternal Grandmother   . Heart disease Maternal Grandmother   . Hypertension Maternal Grandmother   . Heart disease Maternal Grandfather   . Diabetes Paternal Grandmother   . Heart disease Paternal Grandmother   . Cancer Paternal Grandfather     lung    Social History  Substance Use Topics  .  Smoking status: Current Every Day Smoker -- 0.50 packs/day for 8 years  . Smokeless tobacco: Never Used  . Alcohol Use: No    Allergies: No Known Allergies  No prescriptions prior to admission    ROS:  Vaginal d/c, pedal edema, +FM Physical Exam   Blood pressure 110/82, pulse 85, temperature 98.6 F (37 C), resp. rate 18, height  (1.676 m), weight 94.711 kg (208 lb 12.8 oz).  Filed Vitals:   01/31/15 2231 01/31/15 2245 01/31/15 2300 01/31/15 2315  BP: 121/81 125/88 119/82 110/82  Pulse: 99 92 86 85  Temp:      Resp: 18     Height:      Weight:       BP range in MAU:  110-130/81-90. Last 4 BPs:  121/81, 125/88, 119/82, 110/82  Physical Exam  In NAD Chest clear Heart RRR without murmur Abd gravid, NT Pelvic--Small amount mucusy d/c, cervix 2-3 cm, 75%, vtx, -2/ballotable, membranes palpated, cervix soft Ext--DTR 1+, no clonus, 1+ pitting edema  FHR Category 1 UCs very occasional  Results for orders placed or performed during the hospital encounter of 01/31/15 (from the past 24 hour(s))  Urinalysis, Routine w reflex microscopic (not at [facility])     Status: Abnormal   Collection Time:  01/31/15 10:00 PM  Result Value Ref Range   Color, Urine YELLOW YELLOW   APPearance CLEAR CLEAR   Specific Gravity, Urine 1.015 1.005 - 1.030   pH 6.0 5.0 - 8.0   Glucose, UA NEGATIVE NEGATIVE mg/dL   Hgb urine dipstick NEGATIVE NEGATIVE   Bilirubin Urine NEGATIVE NEGATIVE   Ketones, ur NEGATIVE NEGATIVE mg/dL   Protein, ur NEGATIVE NEGATIVE mg/dL   Nitrite NEGATIVE NEGATIVE   Leukocytes, UA TRACE (A) NEGATIVE  Protein / creatinine ratio, urine     Status: None   Collection Time: 01/31/15 10:00 PM  Result Value Ref Range   Creatinine, Urine 97.00 mg/dL   Total Protein, Urine 7 mg/dL   Protein Creatinine Ratio 0.07 0.00 - 0.15 mg/mg[Cre]  Urine microscopic-add on     Status: Abnormal   Collection Time: 01/31/15 10:00 PM  Result Value Ref Range   Squamous Epithelial / LPF 6-30  (A) NONE SEEN   WBC, UA 6-30 0 - 5 WBC/hpf   RBC / HPF NONE SEEN 0 - 5 RBC/hpf   Bacteria, UA FEW (A) NONE SEEN   Casts HYALINE CASTS (A) NEGATIVE  Amnisure rupture of membrane (rom)not at [facility]     Status: None   Collection Time: 01/31/15 10:17 PM  Result Value Ref Range   Amnisure ROM NEGATIVE   CBC     Status: Abnormal   Collection Time: 01/31/15 10:40 PM  Result Value Ref Range   WBC 16.0 (H) 4.0 - 10.5 K/uL   RBC 3.81 (L) 3.87 - 5.11 MIL/uL   Hemoglobin 11.6 (L) 12.0 - 15.0 g/dL   HCT 16.135.3 (L) 09.636.0 - 04.546.0 %   MCV 92.7 78.0 - 100.0 fL   MCH 30.4 26.0 - 34.0 pg   MCHC 32.9 30.0 - 36.0 g/dL   RDW 40.914.1 81.111.5 - 91.415.5 %   Platelets 188 150 - 400 K/uL  Comprehensive metabolic panel     Status: Abnormal   Collection Time: 01/31/15 10:40 PM  Result Value Ref Range   Sodium 136 135 - 145 mmol/L   Potassium 3.6 3.5 - 5.1 mmol/L   Chloride 106 101 - 111 mmol/L   CO2 23 22 - 32 mmol/L   Glucose, Bld 111 (H) 65 - 99 mg/dL   BUN 8 6 - 20 mg/dL   Creatinine, Ser 7.820.48 0.44 - 1.00 mg/dL   Calcium 8.9 8.9 - 95.610.3 mg/dL   Total Protein 6.6 6.5 - 8.1 g/dL   Albumin 2.8 (L) 3.5 - 5.0 g/dL   AST 15 15 - 41 U/L   ALT 10 (L) 14 - 54 U/L   Alkaline Phosphatase 155 (H) 38 - 126 U/L   Total Bilirubin 0.4 0.3 - 1.2 mg/dL   GFR calc non Af Amer >60 >60 mL/min   GFR calc Af Amer >60 >60 mL/min   Anion gap 7 5 - 15  Lactate dehydrogenase     Status: None   Collection Time: 01/31/15 10:40 PM  Result Value Ref Range   LDH 145 98 - 192 U/L  Uric acid     Status: None   Collection Time: 01/31/15 10:40 PM  Result Value Ref Range   Uric Acid, Serum 4.5 2.3 - 6.6 mg/dL    ED Course  Assessment: IUP at 38 2/7 weeks No evidence SROM Mildly elevated BP--negative labs Pedal edema GBS positive  Plan: D/C home with PIH precautions. Rest, with increased fluids. [facility], call prn.   Daily Doe,  Vena Austria, MSN 02/01/2015 12:28 AM

## 2015-02-06 ENCOUNTER — Emergency Department (HOSPITAL_COMMUNITY)
Admission: EM | Admit: 2015-02-06 | Discharge: 2015-02-06 | Disposition: A | Discharge status: Home / Self Care | Payer: Medicaid Other | Attending: Emergency Medicine | Admitting: Emergency Medicine

## 2015-02-06 ENCOUNTER — Encounter (HOSPITAL_COMMUNITY): Payer: Self-pay

## 2015-02-06 DIAGNOSIS — R42 Dizziness and giddiness: Secondary | ICD-10-CM | POA: Insufficient documentation

## 2015-02-06 DIAGNOSIS — Z8679 Personal history of other diseases of the circulatory system: Secondary | ICD-10-CM | POA: Insufficient documentation

## 2015-02-06 DIAGNOSIS — Z8659 Personal history of other mental and behavioral disorders: Secondary | ICD-10-CM | POA: Insufficient documentation

## 2015-02-06 DIAGNOSIS — Z79899 Other long term (current) drug therapy: Secondary | ICD-10-CM | POA: Insufficient documentation

## 2015-02-06 DIAGNOSIS — Z3A39 39 weeks gestation of pregnancy: Secondary | ICD-10-CM | POA: Diagnosis not present

## 2015-02-06 DIAGNOSIS — O99513 Diseases of the respiratory system complicating pregnancy, third trimester: Secondary | ICD-10-CM | POA: Diagnosis not present

## 2015-02-06 DIAGNOSIS — Z87440 Personal history of urinary (tract) infections: Secondary | ICD-10-CM | POA: Insufficient documentation

## 2015-02-06 DIAGNOSIS — J45901 Unspecified asthma with (acute) exacerbation: Secondary | ICD-10-CM | POA: Diagnosis not present

## 2015-02-06 DIAGNOSIS — J069 Acute upper respiratory infection, unspecified: Secondary | ICD-10-CM | POA: Insufficient documentation

## 2015-02-06 DIAGNOSIS — F17200 Nicotine dependence, unspecified, uncomplicated: Secondary | ICD-10-CM | POA: Insufficient documentation

## 2015-02-06 DIAGNOSIS — O99333 Smoking (tobacco) complicating pregnancy, third trimester: Secondary | ICD-10-CM | POA: Diagnosis not present

## 2015-02-06 LAB — RAPID STREP SCREEN (MED CTR MEBANE ONLY): STREPTOCOCCUS, GROUP A SCREEN (DIRECT): NEGATIVE

## 2015-02-06 MED ORDER — ACETAMINOPHEN 325 MG PO TABS
650.0000 mg | ORAL_TABLET | Freq: Once | ORAL | Status: AC
  Administered 2015-02-06: 650 mg via ORAL
  Filled 2015-02-06: qty 2

## 2015-02-06 NOTE — ED Notes (Signed)
Pt [redacted] weeks pregnant.  Pt states she has had a cough since Sunday.  Had normal OB visit on Monday.  Cough not getting better.  Called MD and told to go to Urgent care.  Also has sore throat.  Denies fever.

## 2015-02-06 NOTE — Discharge Instructions (Signed)
1. Medications: usual home medications 2. Treatment: rest, drink plenty of fluids, try warm honey, tea, throat lozenges for symptoms 3. Follow Up: please followup with your primary doctor for discussion of your diagnoses and further evaluation after today's visit; if you do not have a primary care doctor use the resource guide provided to find one; please return to the ER for high fever, chest pain, shortness of breath, abdominal pain, vaginal bleeding, new or worsening symptoms    Upper Respiratory Infection, Adult Most upper respiratory infections (URIs) are a viral infection of the air passages leading to the lungs. A URI affects the nose, throat, and upper air passages. The most common type of URI is nasopharyngitis and is typically referred to as "the common cold." URIs run their course and usually go away on their own. Most of the time, a URI does not require medical attention, but sometimes a bacterial infection in the upper airways can follow a viral infection. This is called a secondary infection. Sinus and middle ear infections are common types of secondary upper respiratory infections. Bacterial pneumonia can also complicate a URI. A URI can worsen asthma and chronic obstructive pulmonary disease (COPD). Sometimes, these complications can require emergency medical care and may be life threatening.  CAUSES Almost all URIs are caused by viruses. A virus is a type of germ and can spread from one person to another.  RISKS FACTORS You may be at risk for a URI if:   You smoke.   You have chronic heart or lung disease.  You have a weakened defense (immune) system.   You are very young or very old.   You have nasal allergies or asthma.  You work in crowded or poorly ventilated areas.  You work in health care facilities or schools. SIGNS AND SYMPTOMS  Symptoms typically develop 2-3 days after you come in contact with a cold virus. Most viral URIs last 7-10 days. However, viral URIs  from the influenza virus (flu virus) can last 14-18 days and are typically more severe. Symptoms may include:   Runny or stuffy (congested) nose.   Sneezing.   Cough.   Sore throat.   Headache.   Fatigue.   Fever.   Loss of appetite.   Pain in your forehead, behind your eyes, and over your cheekbones (sinus pain).  Muscle aches.  DIAGNOSIS  Your health care provider may diagnose a URI by:  Physical exam.  Tests to check that your symptoms are not due to another condition such as:  Strep throat.  Sinusitis.  Pneumonia.  Asthma. TREATMENT  A URI goes away on its own with time. It cannot be cured with medicines, but medicines may be prescribed or recommended to relieve symptoms. Medicines may help:  Reduce your fever.  Reduce your cough.  Relieve nasal congestion. HOME CARE INSTRUCTIONS   Take medicines only as directed by your health care provider.   Gargle warm saltwater or take cough drops to comfort your throat as directed by your health care provider.  Use a warm mist humidifier or inhale steam from a shower to increase air moisture. This may make it easier to breathe.  Drink enough fluid to keep your urine clear or pale yellow.   Eat soups and other clear broths and maintain good nutrition.   Rest as needed.   Return to work when your temperature has returned to normal or as your health care provider advises. You may need to stay home longer to avoid infecting others.  You can also use a face mask and careful hand washing to prevent spread of the virus.  Increase the usage of your inhaler if you have asthma.   Do not use any tobacco products, including cigarettes, chewing tobacco, or electronic cigarettes. If you need help quitting, ask your health care provider. PREVENTION  The best way to protect yourself from getting a cold is to practice good hygiene.   Avoid oral or hand contact with people with cold symptoms.   Wash your hands  often if contact occurs.  There is no clear evidence that vitamin C, vitamin E, echinacea, or exercise reduces the chance of developing a cold. However, it is always recommended to get plenty of rest, exercise, and practice good nutrition.  SEEK MEDICAL CARE IF:   You are getting worse rather than better.   Your symptoms are not controlled by medicine.   You have chills.  You have worsening shortness of breath.  You have brown or red mucus.  You have yellow or brown nasal discharge.  You have pain in your face, especially when you bend forward.  You have a fever.  You have swollen neck glands.  You have pain while swallowing.  You have white areas in the back of your throat. SEEK IMMEDIATE MEDICAL CARE IF:   You have severe or persistent:  Headache.  Ear pain.  Sinus pain.  Chest pain.  You have chronic lung disease and any of the following:  Wheezing.  Prolonged cough.  Coughing up blood.  A change in your usual mucus.  You have a stiff neck.  You have changes in your:  Vision.  Hearing.  Thinking.  Mood. MAKE SURE YOU:   Understand these instructions.  Will watch your condition.  Will get help right away if you are not doing well or get worse.   This information is not intended to replace advice given to you by your health care provider. Make sure you discuss any questions you have with your health care provider.   Document Released: 08/17/2000 Document Revised: 07/08/2014 Document Reviewed: 05/29/2013 Elsevier Interactive Patient Education 2016 [employer].    Emergency Department Resource Guide 1) Find a Doctor and Pay Out of Pocket Although you won't have to find out who is covered by your insurance plan, it is a good idea to ask around and get recommendations. You will then need to call the office and see if the doctor you have chosen will accept you as a new patient and what types of options they offer for patients who are  self-pay. Some doctors offer discounts or will set up payment plans for their patients who do not have insurance, but you will need to ask so you aren't surprised when you get to your appointment.  2) Contact Your Local Health Department Not all health departments have doctors that can see patients for sick visits, but many do, so it is worth a call to see if yours does. If you don't know where your local health department is, you can check in your phone book. The CDC also has a tool to help you locate your state's health department, and many state websites also have listings of all of their local health departments.  3) Find a Walk-in Clinic If your illness is not likely to be very severe or complicated, you may want to try a walk in clinic. These are popping up all over the country in pharmacies, drugstores, and shopping centers. They're usually staffed by  nurse practitioners or physician assistants that have been trained to treat common illnesses and complaints. They're usually fairly quick and inexpensive. However, if you have serious medical issues or chronic medical problems, these are probably not your best option.  No Primary Care Doctor: - Call Health Connect at  [phone] - they can help you locate a primary care doctor that  accepts your insurance, provides certain services, etc. - Physician Referral Service- [phone]  Chronic Pain Problems: Organization         Address  Phone   Notes  [facility]  [phone] Patients need to be referred by their primary care doctor.   Medication Assistance: Organization         Address  Phone   Notes  [facility] Medication [facility] [address] [place] [place], [place] [address] [phone] --Must be a resident of [facility] -- Must have NO insurance coverage whatsoever (no Medicaid/ Medicare, etc.) -- The pt. MUST have a primary care doctor that directs their care regularly and follows them in  the community   [facility]  [phone]   [employer]  [phone]    Agencies that provide inexpensive medical care: Organization         Address  Phone   Notes  [facility]  [phone]   [facility]    [phone]   [facility] [address] [place], [place] [address] [phone]   [facility] [address] [place] [place] [phone]   Planned Parenthood    [phone]   [facility]    [phone]   [facility]  [address], [place] Phone:  [phone], Fax:  [phone] Hours of Operation:  9 am - 6 pm, M-F.  Also accepts Medicaid/Medicare and self-pay.  [facility]  [address], [place] Phone: [phone], Fax: [phone]. Hours of Operation:  8:30 am - 5:30 pm, M-F.  Also accepts Medicaid and self-pay.  [facility] [place] [address] [place] Phone: [phone]   [facility] [address] Natasha Bence [place], [place] [phone], Ext. [phone] Mondays & Thursdays: 7-9 AM.  First 15 patients are seen on a first come, first serve basis.    Medicaid-accepting [facility] Providers:  Organization         Address  Phone   Notes  [facility] [address], [place] [phone] Also accepts self-pay patients.  [facility] [address] Laurell Josephs [place], [place]  [phone]   [facility] [address], [place] [phone]   [facility] [address], [place] [phone]   Renaye Rakers [address], [place]   [phone] Only accepts [place] Access [place] patients after they have their name applied to their card.   Self-Pay (no insurance) in [facility]:  Organization         Address  Phone   Notes  Sickle Cell Patients,  [facility] Internal Medicine [address] [place], [place] [phone]   [facility] [address] [place], [place] [phone]   [facility] [place]  [address], [place] [phone]   [facility]/Dr. Julio Sicks  [address] [place] [address], [place] or [address], [place] [phone] Phone number for both [place] and [place] locations is the same.  [facility] [address], [place] [phone]   [facility] [place] [address] [place] [address], [place] or [address] [phone]   [facility] [address], [place] [phone]) [phone], phone; [phone], fax Sees patients 1st and 3rd Saturday of every month.  Must not qualify for public or private insurance (i.e. Medicaid, Medicare, [place] Health Choice, Veterans' Benefits)  Household income should be no more than 200% of the poverty level The clinic cannot treat you if you are pregnant or think you are pregnant  Sexually transmitted diseases are not treated at the clinic.    Dental Care: Organization         Address  Phone  Notes  [facility] [address], [place] [phone] Accepts children up to age 26 who are enrolled in [place] or [place] Health Choice; pregnant women with a Medicaid card; and children who have applied for Medicaid or [place] Health Choice, but were declined, whose parents can pay a reduced fee at time of service.  [facility] [place]  [address], [place] [phone]) [phone] Accepts children up to age 26 who are enrolled in [place] or [place] Health Choice; pregnant women with a Medicaid card; and children who have applied for Medicaid or  Health Choice, but were declined, whose parents can pay a reduced fee at time of service.  [facility]   [address] [place] [address], [place] [phone] Patients are seen by appointment only. Walk-ins are not accepted. [facility] will see patients 26 years of age and older. Monday - Tuesday (8am-5pm) Most Wednesdays (8:30-5pm) $30 per visit, cash only  [facility]  [address], [facility] [phone] Patients are seen by appointment only. Walk-ins are not accepted. [facility] will see patients 26 years of age and older. One Wednesday Evening (Monthly: Volunteer Based).  $30 per visit, cash only  [employer] [facility] [facility]  [phone] for adults; Children under age 584, call Graduate Pediatric Dentistry at [phone]) [phone]. Children aged 614-14, please call [phone] to request a pediatric application.  Dental services are provided in all areas of dental care including fillings, crowns and bridges, complete and partial dentures, implants, gum treatment, root canals, and extractions. Preventive care is also provided. Treatment is provided to both adults and children. Patients are selected via a lottery and there is often a waiting list.   [facility] [address], [place]  [phone] [email or web address]   [facility] [address], [place], [place] [phone], Ext. [phone] Second and Fourth Thursday of each month, opens at 6:30 AM; Clinic ends at 9 AM.  Patients are seen on a first-come first-served basis, and a limited number are seen during each clinic.   [facility]  [address], [place], [place] [phone]) [phone]   Eligibility Requirements You must have lived in [place], [place], or [place] counties for at least the last three months.   You cannot be eligible for state or federal sponsored [place]healthcare insurance, including CIGNAVeterans Administration, [place]icaid, or [employer]icare.   You generally cannot be eligible for healthcare insurance through your employer.  How to apply: Eligibility screenings are  held every Tuesday and Wednesday afternoon from 1:00 pm until 4:00 pm. You do not need an appointment for the interview!  [facility] [address], [place], [place] [phone]   [facility] Health Department  [phone]   [facility] Health Department  [phone]   [facility]  [phone]    Behavioral Health Resources in the Community: Intensive Outpatient Programs Organization         Address  Phone  Notes  [facility] [address] [place], [place] [phone]   [facility] Outpatient [address], [place], [place] [phone]   [facility] [address], [place], [place]  [phone]   [facility] [address],  [place], [place] [phone] or [phone]   Substance Abuse Resources Organization         Address  Phone  Notes  [facility] Drug Services  [phone]   [facility]  [phone]   [facility]  [phone]   Floydene Flock  [phone]   Residential & Outpatient Substance Abuse Program  [phone]   Psychological Services Organization         Address  Phone  Notes  [facility] Health  [phone]   [facility]  [phone]   [facility] Mental Health [address] [place] [phone] or [phone]    Mobile Crisis Teams Organization         Address  Phone  Notes  [facility], Mobile Crisis Care Unit  [phone]   [facility]  [address] [place], [place] [phone]   Doristine Locks [address] [place] [phone]    Self-Help/Support Groups Organization         Address  Phone             Notes  [facility] [place] - variety of support groups  336- I7437963 Call for more information  Narcotics Anonymous (NA), [facility] [address], [employer] [place]  2 meetings at this location     Statistician         Address  Phone  Notes  [facility] [address],    [place]  [phone]   [facility]  [address], [place] [address], [place], [place] [phone]   [facility] [address] [place], [place] [phone] Admissions: 8am-3pm M-F  [facility] [address],    [place], [place] [phone]   [facility] [address] [place], [place], [place] [phone]   [facility] [address],  [place], [place] [phone]   [facility] - Intensive Outpatient [address] Laurell Josephs [address], [place], [place] [phone]   [facility] [address] [place]  [place], [place] [phone] or [phone]   [facility] ([facility]) [address] [place], [place] [phone] Accepts Medicaid  [facility] [address],  [place] [phone] Substance Abuse/Addiction Treatment   [facility] Organization         Address  Phone  Notes  [facility]  [phone]   Angie Fava, PhD [address] Ervin Knack [place], [place]   [phone] or [phone]   [facility] Behavioral   [address] [place], [place] [phone]   [facility] [address] [place], [place] [phone] Insurance/Medicaid/sponsorship through [employer] and Families [address]                                    [place], [place] [phone] Therapy/tele-psych/case  [facility] [address]   [place], [place] [phone]    Dr. Lolly Mustache  [phone]   [facility]  [employer] [facility] Dept. 1) [address] [place] 2) [address] [place] 3)  [address], [place] [phone]  [phone]   [facility] Child Abuse Hotline [phone] or [phone] (After  Hours)

## 2015-02-06 NOTE — Progress Notes (Signed)
Pt confirms she does not have a medicaid pcp yet states she call many but no one is accepting new pts Encouraged her to use the list provided by CM to find one or call DSS case worker to inquire of accepting providers Provided with medicaid transportation contact information also  Medicaid [place] list to assist with finding a Dr for Follow up care [place]: [phone] [address] [place], [place] [phone] [email or web address].[email or web address] USE THIS SITE TO ASSIST WITH UNDERSTANDING YOUR COVERAGE, RENEW APPLICATION As a Medicaid client you MUST contact DSS/SSI each time you change address, move to another [place] county or another state to keep your address updated

## 2015-02-06 NOTE — ED Provider Notes (Signed)
CSN: [id number]     Arrival date & time 02/06/15  1208 History  By signing my name below, I, Placido SouLogan Joldersma, attest that this documentation has been prepared under the direction and in the presence of Elizabeth C. Westfall, PA-C. Electronically Signed: Placido SouLogan Joldersma, ED Scribe. 02/06/2015. 12:28 PM.     Chief Complaint  Patient presents with  . Cough    The history is provided by the patient. No language interpreter was used.     HPI Comments: Crystal Newman is a 24 y.o. female who is [redacted] weeks pregnant presents to the Emergency Department complaining of a constant, mild, productive cough with green sputum onset 5 days ago. Pt was seen at her OB 4 days ago which she says was a nml visit. She notes associated sore throat, rhinorrhea, sinus congestion, and SOB and lightheadedness when coughing. She notes taking tylenol and OTC cough medicine and denies any relief from either. Pt notes having intermittent contractions but denies anything abnormal. Pt denies hx of pulmonary issues. She denies fevers, chills, eye pain, ear pain, chest pain, and vaginal bleeding.   Past Medical History  Diagnosis Date  . Syncope     neurally-mediated  . Anxiety   . Short PR-normal QRS complex syndrome   . Asthma     sports induced  . Infection     UTI-frequent  . Seizures (HCC)     unknown cause, last at age 24   Past Surgical History  Procedure Laterality Date  . Wisdom tooth extraction    . Tonsillectomy     Family History  Problem Relation Age of Onset  . Hypertension Father   . Cancer Maternal Aunt     ovarian  . Diabetes Maternal Grandmother   . Heart disease Maternal Grandmother   . Hypertension Maternal Grandmother   . Heart disease Maternal Grandfather   . Diabetes Paternal Grandmother   . Heart disease Paternal Grandmother   . Cancer Paternal Grandfather     lung   Social History  Substance Use Topics  . Smoking status: Current Every Day Smoker -- 0.50 packs/day for 8 years  .  Smokeless tobacco: Never Used  . Alcohol Use: No   OB History    Gravida Para Term Preterm AB TAB SAB Ectopic Multiple Living   6    5  5          Review of Systems  Constitutional: Negative for fever and chills.  HENT: Positive for congestion and sore throat. Negative for ear pain and trouble swallowing.   Eyes: Negative for pain.  Respiratory: Positive for cough and shortness of breath.   Cardiovascular: Negative for chest pain.  Genitourinary: Negative for vaginal bleeding.  Neurological: Positive for light-headedness.    Allergies  Review of patient's allergies indicates no known allergies.  Home Medications   Prior to Admission medications   Medication Sig Start Date End Date Taking? Authorizing Provider  acetaminophen (TYLENOL) 500 MG tablet Take 1,000 mg by mouth every 6 (six) hours as needed for moderate pain or headache.   Yes Historical Provider, MD  acyclovir (ZOVIRAX) 400 MG tablet Take 400 mg by mouth every 8 (eight) hours.   Yes Historical Provider, MD  calcium carbonate (TUMS EX) 750 MG chewable tablet Chew 2 tablets by mouth daily as needed for heartburn.   Yes Historical Provider, MD  Dextromethorphan-Menthol (DELSYM COUGH RELIEF MT) Use as directed 10 mLs in the mouth or throat daily as needed (cough).   Yes Historical  Provider, MD  flintstones complete (FLINTSTONES) 60 MG chewable tablet Chew 2 tablets by mouth daily.    Yes Historical Provider, MD    BP 127/74 mmHg  Pulse 80  Temp(Src) 98 F (36.7 C) (Oral)  Resp 16  SpO2 99% Physical Exam  Constitutional: She is oriented to person, place, and time. She appears well-developed and well-nourished. No distress.  HENT:  Head: Normocephalic and atraumatic.  Right Ear: External ear normal.  Left Ear: External ear normal.  Nose: Nose normal.  Mouth/Throat: Uvula is midline, oropharynx is clear and moist and mucous membranes are normal. No oropharyngeal exudate, posterior oropharyngeal edema, posterior  oropharyngeal erythema or tonsillar abscesses.  Eyes: Conjunctivae and EOM are normal. Right eye exhibits no discharge. Left eye exhibits no discharge. No scleral icterus.  Neck: Normal range of motion. Neck supple.  Cardiovascular: Normal rate and regular rhythm.   Pulmonary/Chest: Effort normal and breath sounds normal. No respiratory distress.  Musculoskeletal: Normal range of motion. She exhibits no edema or tenderness.  Neurological: She is alert and oriented to person, place, and time.  Skin: Skin is warm and dry. She is not diaphoretic.  Psychiatric: She has a normal mood and affect. Her behavior is normal.  Nursing note and vitals reviewed.   ED Course  Procedures   DIAGNOSTIC STUDIES: Oxygen Saturation is 100% on RA, normal by my interpretation.    COORDINATION OF CARE: 12:25 PM Pt presents today due to a cough. Discussed treatment plan with pt at bedside including a strep test and reevaluation based on results. Pt agreed to plan.  Labs Review Labs Reviewed  RAPID STREP SCREEN (NOT AT [facility])  CULTURE, GROUP A STREP    Imaging Review No results found.   I have personally reviewed and evaluated these lab results as part of my medical decision-making.   EKG Interpretation None      MDM   Final diagnoses:  URI (upper respiratory infection)    24 year old female presents with sore throat and productive cough, which she states she has had since Sunday. Denies fever, chills. Reports associated shortness of breath and lightheadedness only while coughing. Denies chest pain. She was seen by her OB on Monday, however had persistent symptoms and called her OB today, who advised she come to the ED for a strep test. Patient is afebrile. Vital signs stable. Fetal heart rate 140. Posterior oropharynx without erythema, edema, or exudate. Heart regular rate and rhythm. Lungs clear to auscultation bilaterally. No lower extremity edema. Will give tylenol for symptoms and obtain rapid  strep.  Rapid strep negative. Patient is nontoxic and well-appearing, feel she is stable for discharge at this time. Symptoms most likely viral. Advised to take tylenol for sore throat. Also recommended warm honey, tea, and throat lozenges for additional symptom relief. Patient to follow up with PCP. Return precautions discussed. Patient verbalizes her understanding and is in agreement with plan.  BP 127/74 mmHg  Pulse 80  Temp(Src) 98 F (36.7 C) (Oral)  Resp 16  SpO2 99%  I personally performed the services described in this documentation, which was scribed in my presence. The recorded information has been reviewed and is accurate.    Mady Gemma, PA-C 02/06/15 1334  Arby Barrette, MD 02/08/15 [phone]

## 2015-02-08 LAB — CULTURE, GROUP A STREP: Strep A Culture: NEGATIVE

## 2015-02-10 ENCOUNTER — Inpatient Hospital Stay (HOSPITAL_COMMUNITY)
Admission: AD | Admit: 2015-02-10 | Discharge: 2015-02-12 | DRG: 774 | Disposition: A | Discharge status: Home / Self Care | Payer: Medicaid Other | Source: Ambulatory Visit | Attending: Obstetrics and Gynecology | Admitting: Obstetrics and Gynecology

## 2015-02-10 ENCOUNTER — Encounter (HOSPITAL_COMMUNITY): Payer: Self-pay | Admitting: *Deleted

## 2015-02-10 DIAGNOSIS — A6000 Herpesviral infection of urogenital system, unspecified: Secondary | ICD-10-CM | POA: Diagnosis present

## 2015-02-10 DIAGNOSIS — O26873 Cervical shortening, third trimester: Secondary | ICD-10-CM | POA: Diagnosis present

## 2015-02-10 DIAGNOSIS — O99824 Streptococcus B carrier state complicating childbirth: Secondary | ICD-10-CM | POA: Diagnosis present

## 2015-02-10 DIAGNOSIS — Z3A39 39 weeks gestation of pregnancy: Secondary | ICD-10-CM

## 2015-02-10 DIAGNOSIS — J45909 Unspecified asthma, uncomplicated: Secondary | ICD-10-CM | POA: Diagnosis present

## 2015-02-10 DIAGNOSIS — O9832 Other infections with a predominantly sexual mode of transmission complicating childbirth: Secondary | ICD-10-CM | POA: Diagnosis present

## 2015-02-10 DIAGNOSIS — Z833 Family history of diabetes mellitus: Secondary | ICD-10-CM

## 2015-02-10 DIAGNOSIS — Z8249 Family history of ischemic heart disease and other diseases of the circulatory system: Secondary | ICD-10-CM

## 2015-02-10 DIAGNOSIS — O99334 Smoking (tobacco) complicating childbirth: Secondary | ICD-10-CM | POA: Diagnosis present

## 2015-02-10 DIAGNOSIS — O9952 Diseases of the respiratory system complicating childbirth: Secondary | ICD-10-CM | POA: Diagnosis present

## 2015-02-10 NOTE — MAU Note (Signed)
Pt reports contractions q 4-5 minutes, pressure in lower abd and some blood on tissue when she wipes.

## 2015-02-11 ENCOUNTER — Encounter (HOSPITAL_COMMUNITY): Payer: Self-pay | Admitting: *Deleted

## 2015-02-11 ENCOUNTER — Inpatient Hospital Stay (HOSPITAL_COMMUNITY): Payer: Medicaid Other | Admitting: Anesthesiology

## 2015-02-11 DIAGNOSIS — O26873 Cervical shortening, third trimester: Secondary | ICD-10-CM | POA: Diagnosis present

## 2015-02-11 DIAGNOSIS — O99334 Smoking (tobacco) complicating childbirth: Secondary | ICD-10-CM | POA: Diagnosis present

## 2015-02-11 DIAGNOSIS — O26893 Other specified pregnancy related conditions, third trimester: Secondary | ICD-10-CM | POA: Diagnosis present

## 2015-02-11 DIAGNOSIS — A6000 Herpesviral infection of urogenital system, unspecified: Secondary | ICD-10-CM | POA: Diagnosis present

## 2015-02-11 DIAGNOSIS — Z833 Family history of diabetes mellitus: Secondary | ICD-10-CM | POA: Diagnosis not present

## 2015-02-11 DIAGNOSIS — Z8249 Family history of ischemic heart disease and other diseases of the circulatory system: Secondary | ICD-10-CM | POA: Diagnosis not present

## 2015-02-11 DIAGNOSIS — Z3A39 39 weeks gestation of pregnancy: Secondary | ICD-10-CM | POA: Diagnosis not present

## 2015-02-11 DIAGNOSIS — O9952 Diseases of the respiratory system complicating childbirth: Secondary | ICD-10-CM | POA: Diagnosis present

## 2015-02-11 DIAGNOSIS — O9832 Other infections with a predominantly sexual mode of transmission complicating childbirth: Secondary | ICD-10-CM | POA: Diagnosis present

## 2015-02-11 DIAGNOSIS — J45909 Unspecified asthma, uncomplicated: Secondary | ICD-10-CM | POA: Diagnosis present

## 2015-02-11 DIAGNOSIS — O99824 Streptococcus B carrier state complicating childbirth: Secondary | ICD-10-CM | POA: Diagnosis present

## 2015-02-11 LAB — CBC
HCT: 35.9 % — ABNORMAL LOW (ref 36.0–46.0)
Hemoglobin: 12.2 g/dL (ref 12.0–15.0)
MCH: 31 pg (ref 26.0–34.0)
MCHC: 34 g/dL (ref 30.0–36.0)
MCV: 91.1 fL (ref 78.0–100.0)
PLATELETS: 212 10*3/uL (ref 150–400)
RBC: 3.94 MIL/uL (ref 3.87–5.11)
RDW: 14.3 % (ref 11.5–15.5)
WBC: 19.2 10*3/uL — AB (ref 4.0–10.5)

## 2015-02-11 LAB — TYPE AND SCREEN
ABO/RH(D): O POS
Antibody Screen: NEGATIVE

## 2015-02-11 LAB — RAPID HIV SCREEN (HIV 1/2 AB+AG)
HIV 1/2 ANTIBODIES: NONREACTIVE
HIV-1 P24 Antigen - HIV24: NONREACTIVE

## 2015-02-11 LAB — ABO/RH: ABO/RH(D): O POS

## 2015-02-11 LAB — RPR: RPR Ser Ql: NONREACTIVE

## 2015-02-11 MED ORDER — SENNOSIDES-DOCUSATE SODIUM 8.6-50 MG PO TABS
2.0000 | ORAL_TABLET | ORAL | Status: DC
  Administered 2015-02-12: 2 via ORAL
  Filled 2015-02-11: qty 2

## 2015-02-11 MED ORDER — OXYCODONE-ACETAMINOPHEN 5-325 MG PO TABS: 2.0000 | ORAL_TABLET | ORAL | Status: DC | PRN

## 2015-02-11 MED ORDER — DEXTROSE 5 % IV SOLN
2.5000 10*6.[IU] | INTRAVENOUS | Status: DC
  Administered 2015-02-11 (×2): 2.5 10*6.[IU] via INTRAVENOUS
  Filled 2015-02-11 (×7): qty 2.5

## 2015-02-11 MED ORDER — TETANUS-DIPHTH-ACELL PERTUSSIS 5-2.5-18.5 LF-MCG/0.5 IM SUSP: 0.5000 mL | Freq: Once | INTRAMUSCULAR | Status: DC

## 2015-02-11 MED ORDER — DIBUCAINE 1 % RE OINT: 1.0000 "application " | TOPICAL_OINTMENT | RECTAL | Status: DC | PRN

## 2015-02-11 MED ORDER — PENICILLIN G POTASSIUM 5000000 UNITS IJ SOLR
5.0000 10*6.[IU] | Freq: Once | INTRAVENOUS | Status: AC
  Administered 2015-02-11: 5 10*6.[IU] via INTRAVENOUS
  Filled 2015-02-11: qty 5

## 2015-02-11 MED ORDER — OXYCODONE-ACETAMINOPHEN 5-325 MG PO TABS: 1.0000 | ORAL_TABLET | ORAL | Status: DC | PRN

## 2015-02-11 MED ORDER — LIDOCAINE HCL (PF) 1 % IJ SOLN
INTRAMUSCULAR | Status: DC | PRN
  Administered 2015-02-11: 8 mL via EPIDURAL
  Administered 2015-02-11: 5 mL via EPIDURAL

## 2015-02-11 MED ORDER — CITRIC ACID-SODIUM CITRATE 334-500 MG/5ML PO SOLN: 30.0000 mL | ORAL | Status: DC | PRN

## 2015-02-11 MED ORDER — LACTATED RINGERS IV SOLN
500.0000 mL | INTRAVENOUS | Status: DC | PRN
Start: 2015-02-11 — End: 2015-02-11

## 2015-02-11 MED ORDER — HYDROCOD POLST-CPM POLST ER 10-8 MG/5ML PO SUER
5.0000 mL | Freq: Two times a day (BID) | ORAL | Status: DC | PRN
  Filled 2015-02-11: qty 5

## 2015-02-11 MED ORDER — PRENATAL MULTIVITAMIN CH
1.0000 | ORAL_TABLET | Freq: Every day | ORAL | Status: DC
  Administered 2015-02-12: 1 via ORAL
  Filled 2015-02-11: qty 1

## 2015-02-11 MED ORDER — FENTANYL CITRATE (PF) 100 MCG/2ML IJ SOLN: 50.0000 ug | INTRAMUSCULAR | Status: DC | PRN

## 2015-02-11 MED ORDER — HYDROCOD POLST-CPM POLST ER 10-8 MG/5ML PO SUER
5.0000 mL | Freq: Two times a day (BID) | ORAL | Status: DC | PRN
  Administered 2015-02-11: 5 mL via ORAL
  Filled 2015-02-11 (×2): qty 5

## 2015-02-11 MED ORDER — SIMETHICONE 80 MG PO CHEW: 80.0000 mg | CHEWABLE_TABLET | ORAL | Status: DC | PRN

## 2015-02-11 MED ORDER — ONDANSETRON HCL 4 MG/2ML IJ SOLN
4.0000 mg | Freq: Four times a day (QID) | INTRAMUSCULAR | Status: DC | PRN
  Administered 2015-02-11: 4 mg via INTRAVENOUS
  Filled 2015-02-11: qty 2

## 2015-02-11 MED ORDER — DIPHENHYDRAMINE HCL 25 MG PO CAPS: 25.0000 mg | ORAL_CAPSULE | Freq: Four times a day (QID) | ORAL | Status: DC | PRN

## 2015-02-11 MED ORDER — EPHEDRINE 5 MG/ML INJ
10.0000 mg | INTRAVENOUS | Status: DC | PRN
  Filled 2015-02-11: qty 2

## 2015-02-11 MED ORDER — LIDOCAINE HCL (PF) 1 % IJ SOLN
30.0000 mL | INTRAMUSCULAR | Status: DC | PRN
  Administered 2015-02-11: 30 mL via SUBCUTANEOUS
  Filled 2015-02-11: qty 30

## 2015-02-11 MED ORDER — OXYTOCIN 40 UNITS IN LACTATED RINGERS INFUSION - SIMPLE MED
62.5000 mL/h | INTRAVENOUS | Status: DC
  Administered 2015-02-11: 500 mL/h via INTRAVENOUS
  Filled 2015-02-11: qty 1000

## 2015-02-11 MED ORDER — ONDANSETRON HCL 4 MG PO TABS: 4.0000 mg | ORAL_TABLET | ORAL | Status: DC | PRN

## 2015-02-11 MED ORDER — ONDANSETRON HCL 4 MG/2ML IJ SOLN: 4.0000 mg | INTRAMUSCULAR | Status: DC | PRN

## 2015-02-11 MED ORDER — LACTATED RINGERS IV SOLN
INTRAVENOUS | Status: DC
  Administered 2015-02-11 (×3): via INTRAVENOUS

## 2015-02-11 MED ORDER — BENZOCAINE-MENTHOL 20-0.5 % EX AERO: 1.0000 "application " | INHALATION_SPRAY | CUTANEOUS | Status: DC | PRN

## 2015-02-11 MED ORDER — FENTANYL 2.5 MCG/ML BUPIVACAINE 1/10 % EPIDURAL INFUSION (WH - ANES)
14.0000 mL/h | INTRAMUSCULAR | Status: DC | PRN
  Administered 2015-02-11 (×2): 14 mL/h via EPIDURAL
  Filled 2015-02-11 (×2): qty 125

## 2015-02-11 MED ORDER — ACETAMINOPHEN 325 MG PO TABS
650.0000 mg | ORAL_TABLET | ORAL | Status: DC | PRN
Start: 2015-02-11 — End: 2015-02-12

## 2015-02-11 MED ORDER — DIPHENHYDRAMINE HCL 50 MG/ML IJ SOLN: 12.5000 mg | INTRAMUSCULAR | Status: DC | PRN

## 2015-02-11 MED ORDER — IBUPROFEN 600 MG PO TABS
600.0000 mg | ORAL_TABLET | Freq: Four times a day (QID) | ORAL | Status: DC
  Administered 2015-02-11 – 2015-02-12 (×5): 600 mg via ORAL
  Filled 2015-02-11 (×5): qty 1

## 2015-02-11 MED ORDER — OXYTOCIN BOLUS FROM INFUSION: 500.0000 mL | INTRAVENOUS | Status: DC

## 2015-02-11 MED ORDER — ACETAMINOPHEN 325 MG PO TABS: 650.0000 mg | ORAL_TABLET | ORAL | Status: DC | PRN

## 2015-02-11 MED ORDER — PHENYLEPHRINE 40 MCG/ML (10ML) SYRINGE FOR IV PUSH (FOR BLOOD PRESSURE SUPPORT)
80.0000 ug | PREFILLED_SYRINGE | INTRAVENOUS | Status: DC | PRN
  Filled 2015-02-11: qty 2
  Filled 2015-02-11: qty 20

## 2015-02-11 MED ORDER — LANOLIN HYDROUS EX OINT: TOPICAL_OINTMENT | CUTANEOUS | Status: DC | PRN

## 2015-02-11 MED ORDER — WITCH HAZEL-GLYCERIN EX PADS: 1.0000 "application " | MEDICATED_PAD | CUTANEOUS | Status: DC | PRN

## 2015-02-11 MED ORDER — ZOLPIDEM TARTRATE 5 MG PO TABS: 5.0000 mg | ORAL_TABLET | Freq: Every evening | ORAL | Status: DC | PRN

## 2015-02-11 NOTE — Plan of Care (Signed)
Problem: Pain Management: Goal: Relief or control of pain from uterine contractions will improve Outcome: Completed/Met Date Met:  02/11/15 Pt has epidural, reports pain relief.

## 2015-02-11 NOTE — Anesthesia Preprocedure Evaluation (Signed)
Anesthesia Evaluation  Patient identified by MRN, date of birth, ID band Patient awake    Reviewed: Allergy & Precautions, H&P , NPO status , Patient's Chart, lab work & pertinent test results  Airway Mallampati: I  TM Distance: >3 FB Neck ROM: full    Dental no notable dental hx.    Pulmonary Current Smoker,    Pulmonary exam normal        Cardiovascular negative cardio ROS Normal cardiovascular exam     Neuro/Psych    GI/Hepatic negative GI ROS, Neg liver ROS,   Endo/Other  negative endocrine ROS  Renal/GU negative Renal ROS     Musculoskeletal   Abdominal (+) + obese,   Peds  Hematology negative hematology ROS (+)   Anesthesia Other Findings   Reproductive/Obstetrics (+) Pregnancy                             Anesthesia Physical Anesthesia Plan  ASA: II  Anesthesia Plan: Epidural   Post-op Pain Management:    Induction:   Airway Management Planned:   Additional Equipment:   Intra-op Plan:   Post-operative Plan:   Informed Consent: I have reviewed the patients History and Physical, chart, labs and discussed the procedure including the risks, benefits and alternatives for the proposed anesthesia with the patient or authorized representative who has indicated his/her understanding and acceptance.     Plan Discussed with:   Anesthesia Plan Comments:         Anesthesia Quick Evaluation

## 2015-02-11 NOTE — Anesthesia Procedure Notes (Signed)
Epidural Patient location during procedure: OB Start time: 02/11/2015 1:20 AM End time: 02/11/2015 1:24 AM  Staffing Anesthesiologist: Leilani AbleHATCHETT, Levan Aloia Performed by: anesthesiologist   Preanesthetic Checklist Completed: patient identified, surgical consent, pre-op evaluation, timeout performed, IV checked, risks and benefits discussed and monitors and equipment checked  Epidural Patient position: sitting Prep: site prepped and draped and DuraPrep Patient monitoring: continuous pulse ox and blood pressure Approach: midline Location: L3-L4 Injection technique: LOR air  Needle:  Needle type: Tuohy  Needle gauge: 17 G Needle length: 9 cm and 9 Needle insertion depth: 6 cm Catheter type: closed end flexible Catheter size: 19 Gauge Catheter at skin depth: 11 cm Test dose: negative and Other  Assessment Sensory level: T9 Events: blood not aspirated, injection not painful, no injection resistance, negative IV test and no paresthesia  Additional Notes Reason for block:procedure for pain

## 2015-02-11 NOTE — H&P (Signed)
Crystal Newman is a 24 y.o. female, G6P0050 at 39.6 weeks, presenting for contractions.  Patient states she has been having contractions since 1830 as well as bloody show, vaginal pressure, and back pain.  Patient reports good fetal movement and denies LOF.  Patient is GBS positive and desires an epidural for pain mgmt.   Patient Active Problem List   Diagnosis Date Noted  . Short cervix, antepartum 02/01/2015  . 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 Short cervix affecting pregnancy 11/18/2014  . 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 Rubella non-immune status, antepartum 11/18/2014  . 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 Hidradenitis 07/21/2011  . HYPOTENSION, ORTHOSTATIC 01/21/2009    History of present pregnancy: Patient entered care at 8.1 weeks by LMP which was later changed.   EDC of 02/12/2015 was established by 6.4wk [place] on 06/23/2014.   Anatomy scan:  19.1 weeks, with normal findings and an posterior placenta.   Additional [place] evaluations:  8.1wks based on LMP--later changed. Yolk sac . No fetal pole seen. No SCH. 6.4wks: Yolk sac 13.4wks:  U/S: SIUP, VIABLE, NORMAL FLUID, NORMAL NT, GROWTH 60%. 19.1wks: U/S reviewed: EFW 286g, linear growth, posterior placenta, and Female. 23.1wks: U/S reviewed: normal weight, fluid, cardiac and spine views normal 27.4wks: U/S reviewed: cervical length now 0.808cm decreased from previous (9-4:2.8cm) 28.5wks: [place] TODAY, CERVICAL LENGTH 1.3- 1.7 CM (BETTER), NORMAL EFW/AFI, CEPHALIC.  29.5wks: [place] VTX CX IS DYNAMIC FROM 1.7-2.0 CM. 30.5wks: [place]: SIUP, VERTEX, NORMAL FLUID, GROWTH 46%, CX 1.22 CM (STABLE) 31.5wks: [place]: SIUP, VERTEX, NORMAL FLUID, CX 3.01 CM. 33.1wks: Ultrasound reviewed: EFW 2208g, good interval growth, AFI 19.29 cm, cervic funneling at 1.3-2.0cm. 34.1wks/; U/S reviewed: Cervix 0.93cm, AFI 18.82. Significant prenatal events:  1st Trimester: Started on vaginal progesterone.  C/O back pain and bilateral sciatica pain.    Sent to chiropractor.  Treated for yeast infection.  Genetic testing-AFP negative.  C/O intermittent lower abdominal pain and extreme fluctuations in blood sugar. Seen in MAU for vaginal discharge-given progesterone and procardia  2nd Trimester: Placed on bedrest, 17p d/c'd and vaginal progestrone restarted.  Weekly cervical lengths and given BMZ dosing. C/O abdominal pain/cramping.  3rd Trimester: C/o N/V and darkened urine while taking valtrex, started on acyclovir.  Patient continues to c/o back pain, contractions, and pressure. Patient with some VB after intercourse. Patient c/o edema of feet and hands.  Patient seen in ER for URI.   Last evaluation:  02/09/2015 by Dr. AVS, FHR 140, VE 3/70/-2, BP 100/70, Wt 203lbs  OB History    Gravida Para Term Preterm AB TAB SAB Ectopic Multiple Living   Past Medical History  Diagnosis Date  . Syncope     neurally-mediated  . Anxiety   . Short PR-normal QRS complex syndrome   . Asthma     sports induced  . Infection     UTI-frequent  . Seizures (HCC)     unknown cause, last at age 64   Past Surgical History  Procedure Laterality Date  . Wisdom tooth extraction    . Tonsillectomy     Family History: family history includes Cancer in her maternal aunt and paternal grandfather; Diabetes in her maternal grandmother and paternal grandmother; Heart disease in her maternal grandfather, maternal grandmother, and paternal grandmother; Hypertension in her father and maternal grandmother. Social History:  reports that she has been smoking.  She has never used smokeless tobacco. She reports that  she does not drink alcohol or use illicit drugs.   Prenatal Transfer Tool  Maternal Diabetes: No Genetic Screening: Normal Maternal Ultrasounds/Referrals: Normal Fetal Ultrasounds or other Referrals:  None Maternal Substance Abuse:  No Significant Maternal Medications:  Meds include: Progesterone Significant Maternal Lab Results: Lab values  include: Group B Strep positive    ROS:  See above  No Known Allergies     Blood pressure 126/84, pulse 102, temperature 98.2 F (36.8 C), temperature source Oral, resp. rate 18, height 5\' 6"  (1.676 m), weight 92.534 kg (204 lb), SpO2 100 %.  Physical Exam  Vitals reviewed. Constitutional: She is oriented to person, place, and time. She appears well-developed and well-nourished.  HENT:  Head: Normocephalic and atraumatic.  Eyes: EOM are normal. Pupils are equal, round, and reactive to light.  Neck: Normal range of motion.  Cardiovascular: Normal rate.   Respiratory: Effort normal.  GI: Soft.  Musculoskeletal: Normal range of motion.  Neurological: She is alert and oriented to person, place, and time.  Psychiatric: She has a normal mood and affect.     FHR: 135 bpm, Mod Var, -Decels, +Accels UCs:  Palpates moderate  Prenatal labs: ABO, Rh: --/--/O POS, O POS (12/07 0022) Antibody: NEG (12/07 0022) Rubella:  !Error! Non-Immune RPR: Nonreactive (03/29 0000)  HBsAg: Negative (03/29 0000)  HIV: Non-reactive (03/29 0000)  GBS:  Positive Sickle cell/Hgb electrophoresis:  N/A Pap:  Unknown GC:  Negative Chlamydia:  Negative Other:  Wet Prep-Negative, HSV-1 Positive    Assessment IUP at 39.6wks Cat I FT Active Labor GBS Positive Rubella NI   [facility] and Delivery Orders per [facility] Protocol POC discussed and as follows -Okay for epidural -Will receive PCN for GBS prophylaxis -Expectant Mgmt Dr.N. Normand Sloopillard to be updated as appropriate  Joellyn QuailsJessica L EmlyCNM, MSN 02/11/2015, 12:01 AM

## 2015-02-11 NOTE — Progress Notes (Signed)
Crystal LeydenShannon S Newman MRN: [id number]  Subjective: -Patient comfortable after epidural.  No questions or concerns at current.  FOB at bedside.   Objective: BP 121/82 mmHg  Pulse 99  Temp(Src) 98.8 F (37.1 C) (Oral)  Resp 18  Ht 5\' 6"  (1.676 m)  Wt 92.534 kg (204 lb)  BMI 32.94 kg/m2  SpO2 100%      Fetal Monitoring: FHT: 135 bpm, Mod Var, -Decels, +Accels UC: Palpates moderate to strong    Vaginal Exam: SVE:   Dilation: 5.5 Effacement (%): 80 Station: -2 Exam by:: Gerrit HeckJessica Rhiley Solem, CNM  Bloody Show Membranes:Bulging Internal Monitors: None  Augmentation/Induction: Pitocin:None Cytotec: None  Assessment:  IUP at 39.6wks Cat I FT  Active Labor GBS Positive  Plan: -Informed that AROM would not be performed due to GBS+ and recent treatment of PCN <4 hrs ago -Will reevaluate in 3 hrs or prn for AROM if minimal to no cervical change -Continue other mgmt as ordered   Valma CavaJessica L Kerigan Narvaez,MSN, CNM 02/11/2015, 3:49 AM

## 2015-02-11 NOTE — Progress Notes (Signed)
Crystal Newman MRN: [id number]  Subjective: -Patient resting in bed.  No complaints.  Family members at bedside.   Objective: BP 118/82 mmHg  Pulse 103  Temp(Src) 98.9 F (37.2 C) (Oral)  Resp 18  Ht 5\' 6"  (1.676 m)  Wt 92.534 kg (204 lb)  BMI 32.94 kg/m2  SpO2 100% I/O last 3 completed shifts: In: -  Out: 350 [Urine:350]    Fetal Monitoring: FHT: 145 bpm, Mod Var, -Decels, +Accels UC: Q2-654min, palpates moderate    Vaginal Exam: SVE:   Dilation: 5.5 Effacement (%): 80 Station: -2 Exam by:: Gerrit HeckJessica Brannan Cassedy, CNM Membranes:AROM of large amt clear fluid Internal Monitors: None  Augmentation/Induction: Pitocin:None Cytotec: None  Assessment:  IUP at 39.6wks Cat I FT  Amniotomy  Plan: -Discussed amniotomy r/b including increased risk of infection and cord prolapse as well as decreased labor time. Patient verbalizes understanding and agrees to amniotomy. -Continue other mgmt as ordered -Report given to oncoming provider, Manfred ArchV. Latham, CNM  Joyce CopaJessica L Luis Nickles,MSN, CNM 02/11/2015, 7:01 AM

## 2015-02-11 NOTE — Progress Notes (Signed)
  Subjective: Doing well, no rectal pressure.  Continues to have sporadic cough, with recent dx of URI.  Objective: BP 97/46 mmHg  Pulse 84  Temp(Src) 98.8 F (37.1 C) (Oral)  Resp 18  Ht 5\' 6"  (1.676 m)  Wt 92.534 kg (204 lb)  BMI 32.94 kg/m2  SpO2 96% I/O last 3 completed shifts: In: -  Out: 350 [Urine:350]    FHT: Category 1 UC:   q 3-5 min SVE:   Dilation: 10 Effacement (%): 100 Station: +2 Exam by:: Nigel BridgemanVicki Tynisa Vohs, CNM   Assessment:  2nd stage labor GBS positive Rubella non-immune  Plan: Await increased pressure/urge to push. Position to facilitate gravitational pressure. Cough med prn.  Nigel BridgemanLATHAM, Malashia Kamaka CNM 02/11/2015, 10:24 AM

## 2015-02-11 NOTE — Lactation Note (Signed)
This note was copied from the chart of Crystal Newman Ditton. Lactation Consultation Note Initial visit at 10 hours of age.  Mom holding baby STS after bath, but baby is too sleepy to eat.  Mom reports a good feeding after delivery and then baby has had some spitting and a few dirty diapers.  Instructed mom on hand expression with several drops expressed and discussed option to spoon feed if baby doesn't latch well. [facility] resources given and discussed.  Encouraged to feed with early cues on demand.  Early newborn behavior discussed.  Mom to call for assist as needed.     Patient Name: Crystal Newman Vilar ZOXWR'UToday's Date: 02/11/2015 Reason for consult: Initial assessment   Maternal Data Has patient been taught Hand Expression?: Yes Does the patient have breastfeeding experience prior to this delivery?: No  Feeding Feeding Type: Breast Fed  LATCH Score/Interventions                      Lactation Tools Discussed/Used     Consult Status Consult Status: Follow-up Date: 02/12/15 Follow-up type: In-patient    Beverely RisenShoptaw, Arvella MerlesJana Lynn 02/11/2015, 9:37 PM

## 2015-02-12 LAB — CBC
HCT: 30.9 % — ABNORMAL LOW (ref 36.0–46.0)
Hemoglobin: 10.1 g/dL — ABNORMAL LOW (ref 12.0–15.0)
MCH: 30 pg (ref 26.0–34.0)
MCHC: 32.7 g/dL (ref 30.0–36.0)
MCV: 91.7 fL (ref 78.0–100.0)
PLATELETS: 184 10*3/uL (ref 150–400)
RBC: 3.37 MIL/uL — AB (ref 3.87–5.11)
RDW: 14.4 % (ref 11.5–15.5)
WBC: 20.8 10*3/uL — AB (ref 4.0–10.5)

## 2015-02-12 MED ORDER — OXYCODONE-ACETAMINOPHEN 5-325 MG PO TABS: 1.0000 | ORAL_TABLET | ORAL | Status: DC | PRN

## 2015-02-12 MED ORDER — NORETHINDRONE 0.35 MG PO TABS: 1.0000 | ORAL_TABLET | Freq: Every day | ORAL | Status: DC

## 2015-02-12 MED ORDER — MEASLES, MUMPS & RUBELLA VAC ~~LOC~~ INJ
0.5000 mL | INJECTION | Freq: Once | SUBCUTANEOUS | Status: DC
  Filled 2015-02-12: qty 0.5

## 2015-02-12 MED ORDER — IBUPROFEN 600 MG PO TABS: 600.0000 mg | ORAL_TABLET | Freq: Four times a day (QID) | ORAL | Status: DC | PRN

## 2015-02-12 NOTE — Progress Notes (Signed)
MOB was referred for history of depression/anxiety.  Referral is screened out by Clinical Social Worker because none of the following criteria appear to apply: -History of anxiety/depression during this pregnancy, or of post-partum depression. - Diagnosis of anxiety and/or depression within last 3 years or -MOB's symptoms are currently being treated with medication and/or therapy.  Per chart review, symptoms of anxiety occurred more than 3 years ago. No concerns during the pregnancy. Despite rule out criteria, CSW attempted to meet with MOB.  Full assessment was not completed due to MOB presenting as tired, denying need for full assessment. preparing for discharge, and having numerous family members present.  CSW provided MOB and FOB with education regarding the Baby Blues and perinatal mood disorders. MOB denied mental health complications during the pregnancy, but acknowledged her increase risk due to history of anxiety.  MOB and FOB acknowledged commonality of symptoms, and agreed to follow up with medical provider if they note onset of symptoms. MOB and FOB denied questions, concerns, or needs at this time.  Please contact the Clinical Social Worker if needs arise or upon MOB request.  

## 2015-02-12 NOTE — Discharge Instructions (Signed)

## 2015-02-12 NOTE — Lactation Note (Signed)
This note was copied from the chart of Boy Barrett HenleShannon Musto. Lactation Consultation Note  Patient Name: Boy Barrett HenleShannon Haymer GNFAO'ZToday's Date: 02/12/2015 Reason for consult: Follow-up assessment (baby last was fed a bottle at 1000 due to mom being exhausted , also family in room , mom ok with them staying)  Mom is exhausted and wants to go home due to that reason. Per mom I gave 2 bottles due to being so tired , even though the baby fed well with the NS. Per grandmother  Baby fed really well and mom reports milk in the NS afterwards. LC asked mom what her feeding preference is at this point. Per mom I still want to try breastfeeding and if that  Doesn't work will pump and bottle feed. Per mom active with WIC - [place], [facility] with instructions.  Reviewed LC plan if latching at the breast with a Nipple Shield using the curved tip syringe. And extra pumping due to using the NS.  If mom decides to just pump and bottle feed to be consistent pumping every 2-3 hours and at least 8 times a day or greater to establish and protect milk supply.  Mom denies sore nipples, sore nipple and engorgement prevention and tx reviewed. Instructed on use of shells to enhance the Nipple Shield fitting.  Mom receptive to coming [facility]. On Tuesday December 20 th at 1 pm.  [facility] referred to Baby and me booklet pages 24 and 25.  Mother informed of post-discharge support and given phone number to the lactation department, including services for phone call assistance; out-patient appointments; and breastfeeding  support group. List of other breastfeeding resources in the community given in the handout. Encouraged mother to call for problems or concerns related to breastfeeding.   Maternal Data    Feeding Feeding Type: Bottle Fed - Formula Nipple Type: Slow - flow  LATCH Score/Interventions                Intervention(s): Breastfeeding basics reviewed     Lactation Tools  Discussed/Used Tools: Nipple Shields Nipple shield size:  (? size , per mom dad already took them to the car) WIC Program: Yes ([facility] [place] Pump Review: Setup, frequency, and cleaning;Milk Storage Initiated by:: MAI  Date initiated:: 02/12/15   Consult Status Consult Status: Follow-up (F/U due to using a NS ) Date: 02/24/15 (at 1 pm ) Follow-up type: Out-patient    Kathrin Greathouseorio, Neesha Langton Ann 02/12/2015, 1:01 PM

## 2015-02-12 NOTE — Anesthesia Postprocedure Evaluation (Signed)
Anesthesia Post Note  Patient: Crystal Newman  Procedure(s) Performed: * No procedures listed *  Patient location during evaluation: Mother Baby Anesthesia Type: Epidural Level of consciousness: awake and alert and oriented Pain management: pain level controlled Vital Signs Assessment: post-procedure vital signs reviewed and stable Respiratory status: spontaneous breathing and nonlabored ventilation Cardiovascular status: stable Postop Assessment: no headache, no backache, no signs of nausea or vomiting and adequate PO intake (paitent up walking) Anesthetic complications: no    Last Vitals:  Filed Vitals:   02/11/15 1851 02/12/15 0606  BP: 112/76 103/64  Pulse: 74 62  Temp: 36.8 C 36.5 C  Resp: 20 18    Last Pain:  Filed Vitals:   02/12/15 0614  PainSc: 2                  Danean Marner

## 2015-02-12 NOTE — Discharge Summary (Signed)
OB Discharge Summary     Patient Name: Crystal Newman DOB: [birth year] MRN: [id number]  Date of admission: 02/10/2015 Delivering MD: Donnel Saxon   Date of discharge: 02/12/2015  Admitting diagnosis: 39 WEEKS CTX Intrauterine pregnancy: [redacted]w[redacted]d    Secondary diagnosis:  Principal Problem:   Vaginal delivery  Patient Active Problem List   Diagnosis Date Noted  . Vaginal delivery 02/11/2015  . Short cervix, antepartum 02/01/2015  . 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 Short cervix affecting pregnancy 11/18/2014  . 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 Rubella non-immune status, antepartum 11/18/2014  . 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 Hidradenitis 07/21/2011  . HYPOTENSION, ORTHOSTATIC 01/21/2009   Additional problems:  None     Discharge diagnosis: Term Pregnancy Delivered                                                                                                Post partum procedures:MMR offered  Augmentation: AROM  Complications: None  Hospital course:  Onset of Labor With Vaginal Delivery     24y.o. yo [id number] admitted in Active Laboron 02/10/2015. Patient had an uncomplicated labor course as follows:  Membrane Rupture Time/Date: 6:48 AM ,02/11/2015   Intrapartum Procedures: Episiotomy: None [1]                                         Lacerations:  Labial [10]  Patient had a delivery of a Viable infant. 02/11/2015  Information for the patient's newborn:  RiPorshe, Fleagle0[[id number]]Delivery Method: Vaginal, Spontaneous Delivery (Filed from Delivery Summary)    Pateint had an uncomplicated postpartum course.  She is ambulating, tolerating a regular diet, passing flatus, and urinating well. Patient is discharged home in stable condition on No discharge date for patient encounter.. Marland Kitchen  Physical exam  Filed Vitals:   02/11/15 1357 02/11/15 1504 02/11/15 1851 02/12/15 0606  BP: 119/60 105/62 112/76 103/64  Pulse: 94 69 74 62   Temp: 99 F (37.2 C) 98.2 F (36.8 C) 98.3 F (36.8 C) 97.7 F (36.5 C)  TempSrc: Oral Oral Oral Oral  Resp: 18 18 20 18   Height:      Weight:      SpO2:   98%    General: alert Lochia: appropriate Uterine Fundus: firm Incision: Healing well with no significant drainage--bilateral inner labial lacerations DVT Evaluation: No evidence of DVT seen on physical exam. Negative Homan's sign. Labs: Lab Results  Component Value Date   WBC 20.8* 02/12/2015   HGB 10.1* 02/12/2015   HCT 30.9* 02/12/2015   MCV 91.7 02/12/2015   PLT 184 02/12/2015   CMP Latest Ref Rng 01/31/2015  Glucose 65 - 99 mg/dL 111(H)  BUN 6 - 20 mg/dL 8  Creatinine 0.44 - 1.00 mg/dL 0.48  Sodium 135 - 145 mmol/L 136  Potassium 3.5 - 5.1 mmol/L 3.6  Chloride 101 - 111 mmol/L 106  CO2 22 - 32 mmol/L 23  Calcium 8.9 -  10.3 mg/dL 8.9  Total Protein 6.5 - 8.1 g/dL 6.6  Total Bilirubin 0.3 - 1.2 mg/dL 0.4  Alkaline Phos 38 - 126 U/L 155(H)  AST 15 - 41 U/L 15  ALT 14 - 54 U/L 10(L)    Discharge instruction: per After Visit Summary and "Baby and Me Booklet".  After visit meds:    Medication List    STOP taking these medications        acyclovir 400 MG tablet  Commonly known as:  ZOVIRAX      TAKE these medications        acetaminophen 500 MG tablet  Commonly known as:  TYLENOL  Take 1,000 mg by mouth every 6 (six) hours as needed for moderate pain or headache.     flintstones complete 60 MG chewable tablet  Chew 2 tablets by mouth daily.     ibuprofen 600 MG tablet  Commonly known as:  ADVIL,MOTRIN  Take 1 tablet (600 mg total) by mouth every 6 (six) hours as needed.     norethindrone 0.35 MG tablet  Commonly known as:  ORTHO MICRONOR  Take 1 tablet (0.35 mg total) by mouth daily.     oxyCODONE-acetaminophen 5-325 MG tablet  Commonly known as:  PERCOCET/ROXICET  Take 1 tablet by mouth every 4 (four) hours as needed (for pain scale 4-7).        Diet: routine diet  Activity: Advance  as tolerated. Pelvic rest for 6 weeks.   Outpatient follow up:6 weeks Follow up Appt:No future appointments. Follow up Visit:No Follow-up on file.  Postpartum contraception: Progesterone only pills  Newborn Data: Live born female  Birth Weight: 6 lb 13 oz (3090 g) APGAR: 8, 9  Baby Feeding: Bottle and Breast Disposition:home with mother   02/12/2015 Donnel Saxon, CNM

## 2015-02-12 NOTE — Progress Notes (Signed)
Patient coughing tussinex given

## 2015-02-24 ENCOUNTER — Ambulatory Visit (HOSPITAL_COMMUNITY): Payer: Medicaid Other

## 2015-12-22 ENCOUNTER — Emergency Department (HOSPITAL_COMMUNITY): Payer: Self-pay

## 2015-12-22 ENCOUNTER — Emergency Department (HOSPITAL_COMMUNITY)
Admission: EM | Admit: 2015-12-22 | Discharge: 2015-12-22 | Disposition: A | Discharge status: Home / Self Care | Payer: Self-pay | Attending: Emergency Medicine | Admitting: Emergency Medicine

## 2015-12-22 ENCOUNTER — Encounter (HOSPITAL_COMMUNITY): Payer: Self-pay | Admitting: Emergency Medicine

## 2015-12-22 DIAGNOSIS — F17200 Nicotine dependence, unspecified, uncomplicated: Secondary | ICD-10-CM | POA: Insufficient documentation

## 2015-12-22 DIAGNOSIS — J45909 Unspecified asthma, uncomplicated: Secondary | ICD-10-CM | POA: Insufficient documentation

## 2015-12-22 DIAGNOSIS — J069 Acute upper respiratory infection, unspecified: Secondary | ICD-10-CM | POA: Insufficient documentation

## 2015-12-22 DIAGNOSIS — A084 Viral intestinal infection, unspecified: Secondary | ICD-10-CM | POA: Insufficient documentation

## 2015-12-22 DIAGNOSIS — K644 Residual hemorrhoidal skin tags: Secondary | ICD-10-CM | POA: Insufficient documentation

## 2015-12-22 LAB — COMPREHENSIVE METABOLIC PANEL
ALBUMIN: 3.8 g/dL (ref 3.5–5.0)
ALK PHOS: 73 U/L (ref 38–126)
ALT: 12 U/L — ABNORMAL LOW (ref 14–54)
AST: 18 U/L (ref 15–41)
Anion gap: 6 (ref 5–15)
BILIRUBIN TOTAL: 0.9 mg/dL (ref 0.3–1.2)
BUN: 6 mg/dL (ref 6–20)
CO2: 27 mmol/L (ref 22–32)
Calcium: 9.1 mg/dL (ref 8.9–10.3)
Chloride: 108 mmol/L (ref 101–111)
Creatinine, Ser: 0.72 mg/dL (ref 0.44–1.00)
GFR calc Af Amer: >60 mL/min (ref >60)
GFR calc non Af Amer: >60 mL/min (ref >60)
GLUCOSE: 87 mg/dL (ref 65–99)
POTASSIUM: 3.9 mmol/L (ref 3.5–5.1)
SODIUM: 141 mmol/L (ref 135–145)
TOTAL PROTEIN: 6.7 g/dL (ref 6.5–8.1)

## 2015-12-22 LAB — URINALYSIS, ROUTINE W REFLEX MICROSCOPIC
BILIRUBIN URINE: NEGATIVE
GLUCOSE, UA: NEGATIVE mg/dL
HGB URINE DIPSTICK: NEGATIVE
KETONES UR: NEGATIVE mg/dL
Leukocytes, UA: NEGATIVE
NITRITE: NEGATIVE
PH: 5.5 (ref 5.0–8.0)
Protein, ur: NEGATIVE mg/dL
SPECIFIC GRAVITY, URINE: 1.007 (ref 1.005–1.030)

## 2015-12-22 LAB — LIPASE, BLOOD: Lipase: 29 U/L (ref 11–51)

## 2015-12-22 LAB — CBC
HEMATOCRIT: 42.4 % (ref 36.0–46.0)
Hemoglobin: 13.7 g/dL (ref 12.0–15.0)
MCH: 30 pg (ref 26.0–34.0)
MCHC: 32.3 g/dL (ref 30.0–36.0)
MCV: 92.8 fL (ref 78.0–100.0)
Platelets: 207 10*3/uL (ref 150–400)
RBC: 4.57 MIL/uL (ref 3.87–5.11)
RDW: 13.1 % (ref 11.5–15.5)
WBC: 7.2 10*3/uL (ref 4.0–10.5)

## 2015-12-22 LAB — PREGNANCY, URINE: Preg Test, Ur: NEGATIVE

## 2015-12-22 LAB — POC OCCULT BLOOD, ED: Fecal Occult Bld: NEGATIVE

## 2015-12-22 MED ORDER — DIBUCAINE 1 % EX OINT: TOPICAL_OINTMENT | Freq: Three times a day (TID) | CUTANEOUS | 0 refills | Status: DC | PRN

## 2015-12-22 MED ORDER — ONDANSETRON 4 MG PO TBDP: 4.0000 mg | ORAL_TABLET | Freq: Three times a day (TID) | ORAL | 0 refills | Status: DC | PRN

## 2015-12-22 MED ORDER — BENZONATATE 100 MG PO CAPS: 200.0000 mg | ORAL_CAPSULE | Freq: Two times a day (BID) | ORAL | 0 refills | Status: DC | PRN

## 2015-12-22 MED ORDER — OXYMETAZOLINE HCL 0.05 % NA SOLN: 1.0000 | Freq: Two times a day (BID) | NASAL | 0 refills | Status: DC

## 2015-12-22 NOTE — Discharge Instructions (Signed)
Take your medications as prescribed. I recommend continuing to drink fluids at home to remain hydrated. I recommend eating a bland diet for the next 2 days until her symptoms have improved. Follow-up with your primary care physician in 3-4 days if her symptoms have not improved or worsened. Please return to the Emergency Department if symptoms worsen or new onset of fever, headache, neck stiffness, coughing up blood, chest pain, difficulty breathing, new/worsening abdominal pain, vomiting, unable to keep fluids down, blood in stool or emesis.

## 2015-12-22 NOTE — ED Notes (Signed)
Pt drinking sprite for fluid challenge 

## 2015-12-22 NOTE — ED Provider Notes (Signed)
[facility]-EMERGENCY DEPT Provider Note   CSN: [id number] Arrival date & time: 12/22/15  1006     History   Chief Complaint Chief Complaint  Patient presents with  . Abdominal Pain    HPI Collene LeydenShannon S Sassone is a 25 y.o. female.  Patient is a 25 year old female with history of anxiety and asthma who presents the ED with complaint of abdominal pain, onset yesterday morning. Patient reports she was woken up from her sleep around 2 AM yesterday morning due to having constant nagging pain to her epigastric region. She notes the pain has remained constant starting yesterday she began to have intermittent sharp shooting pain to her left upper quadrant. She notes pain is worse with movement or when walking. Denies any alleviating factors. Endorses associated fever (101.9 last night, denies taking any antipyretics close), chills, nausea, vomiting (2 episodes of NBNB emesis this morning) and 1-2 loose stools for the past 2 days. Pt reports noticing a small amount of bright red blood in her stool yesterday and notes her BM today was "black and loose/stringy". Reports having small hemorrhoids currently but denies any pain or known bleeding. Denies hematemesis, constipation, urinary sxs, vaginal bleeding, vaginal d/c. A shunt reports taking ibuprofen yesterday without relief. She notes she has been taking amoxicillin intermittently over the past 1-2 weeks for her ear infection, denies any other antibiotic use. Denies any recent travel outside of the [place], recent hospitalization or drinking from fresh water sources. Denies any known sick contacts. Denies history of abdominal surgeries. LMP 10/13.  Patient also reports she has had a worsening cold for the past 5 days. Endorses associated nasal congestion, rhinorrhea, ear fullness, sore throat, productive cough (clear sputum), and wheezing. Patient denies taking any medications for her symptoms. She notes her symptoms have remained constant and she feels like they have  worsened over the past 2 days. Denies chest pain, shortness of breath, hemoptysis, headache, neck stiffness.       Past Medical History:  Diagnosis Date  . Anxiety   . Asthma    sports induced  . Infection    UTI-frequent  . Seizures (HCC)    unknown cause, last at age 25  . Short PR-normal QRS complex syndrome   . Syncope    neurally-mediated    Patient Active Problem List   Diagnosis Date Noted  . Vaginal delivery 02/11/2015  . Short cervix, antepartum 02/01/2015  . 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 Short cervix affecting pregnancy 11/18/2014  . 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 Rubella non-immune status, antepartum 11/18/2014  . 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 Hidradenitis 07/21/2011  . HYPOTENSION, ORTHOSTATIC 01/21/2009    Past Surgical History:  Procedure Laterality Date  . TONSILLECTOMY    . WISDOM TOOTH EXTRACTION      OB History    Gravida Para Term Preterm AB Living   6 1 1   5 1    SAB TAB Ectopic Multiple Live Births   5     0 1       Home Medications    Prior to Admission medications   Medication Sig Start Date End Date Taking? Authorizing Provider  amoxicillin (AMOXIL) 875 MG tablet Take 875 mg by mouth 2 (two) times daily.   Yes Historical Provider, MD  ibuprofen (ADVIL,MOTRIN) 600 MG tablet Take 1 tablet (600 mg total) by mouth every 6 (six) hours as needed. Patient taking differently: Take 600 mg by mouth every 6 (six) hours as needed for moderate pain.  02/12/15  Yes Nigel Bridgeman, CNM  Probiotic Product (PROBIOTIC PO) Take 1 tablet by mouth daily.   Yes Historical Provider, MD  sertraline (ZOLOFT) 50 MG tablet Take 50 mg by mouth daily.   Yes Historical Provider, MD  benzonatate (TESSALON) 100 MG capsule Take 2 capsules (200 mg total) by mouth 2 (two) times daily as needed for cough. 12/22/15   Barrett Henle, PA-C  dibucaine (NUPERCAINAL) 1 % ointment Apply topically 3 (three) times daily as needed  for pain. 12/22/15   Barrett Henle, PA-C  norethindrone (ORTHO MICRONOR) 0.35 MG tablet Take 1 tablet (0.35 mg total) by mouth daily. Patient not taking: Reported on 12/22/2015 02/12/15   Nigel Bridgeman, CNM  ondansetron (ZOFRAN ODT) 4 MG disintegrating tablet Take 1 tablet (4 mg total) by mouth every 8 (eight) hours as needed for nausea or vomiting. 12/22/15   Barrett Henle, PA-C  oxyCODONE-acetaminophen (PERCOCET/ROXICET) 5-325 MG tablet Take 1 tablet by mouth every 4 (four) hours as needed (for pain scale 4-7). Patient not taking: Reported on 12/22/2015 02/12/15   Nigel Bridgeman, CNM  oxymetazoline (AFRIN NASAL SPRAY) 0.05 % nasal spray Place 1 spray into both nostrils 2 (two) times daily. Place 1 spray into both nostrils 2 times daily for up to the next 3 days as needed for nasal congestion. Do not use for more than 3 days to prevent rebound rhinorrhea. 12/22/15   Barrett Henle, PA-C    Family History Family History  Problem Relation Age of Onset  . Hypertension Father   . Cancer Maternal Aunt     ovarian  . Diabetes Maternal Grandmother   . Heart disease Maternal Grandmother   . Hypertension Maternal Grandmother   . Heart disease Maternal Grandfather   . Diabetes Paternal Grandmother   . Heart disease Paternal Grandmother   . Cancer Paternal Grandfather     lung    Social History Social History  Substance Use Topics  . Smoking status: Current Every Day Smoker    Packs/day: 0.50    Years: 8.00  . Smokeless tobacco: Never Used  . Alcohol use No     Allergies   Review of patient's allergies indicates no known allergies.   Review of Systems Review of Systems  Constitutional: Positive for chills and fever (101.9).  HENT: Positive for congestion, rhinorrhea and sore throat.   Respiratory: Positive for cough and wheezing.   Gastrointestinal: Positive for abdominal pain, blood in stool, diarrhea ("loose stool"), nausea and vomiting.  All other  systems reviewed and are negative.    Physical Exam Updated Vital Signs BP 100/65 (BP Location: Right Arm)   Pulse 80   Temp 98 F (36.7 C) (Oral)   Resp 16   Ht 5\' 6"  (1.676 m)   Wt 78.5 kg   LMP 12/20/2015   SpO2 97%   BMI 27.92 kg/m   Physical Exam  Constitutional: She is oriented to person, place, and time. She appears well-developed and well-nourished. No distress.  HENT:  Head: Normocephalic and atraumatic.  Right Ear: Tympanic membrane normal.  Left Ear: Tympanic membrane normal.  Nose: Rhinorrhea present. Right sinus exhibits no maxillary sinus tenderness and no frontal sinus tenderness. Left sinus exhibits no maxillary sinus tenderness and no frontal sinus tenderness.  Mouth/Throat: Uvula is midline, oropharynx is clear and moist and mucous membranes are normal. No oropharyngeal exudate, posterior oropharyngeal edema, posterior oropharyngeal erythema or tonsillar abscesses. No tonsillar exudate.  Eyes: Conjunctivae and EOM are normal. Pupils are equal,  round, and reactive to light. Right eye exhibits no discharge. Left eye exhibits no discharge. No scleral icterus.  Neck: Normal range of motion. Neck supple.  Cardiovascular: Normal rate, regular rhythm, normal heart sounds and intact distal pulses.   HR 92  Pulmonary/Chest: Effort normal and breath sounds normal. No respiratory distress. She has no wheezes. She has no rales. She exhibits no tenderness.  Abdominal: Soft. Bowel sounds are normal. She exhibits no distension and no mass. There is no splenomegaly or hepatomegaly. There is tenderness (epigastric). There is no rigidity, no rebound, no guarding, no CVA tenderness and negative Murphy's sign. No hernia.  No CVA tenderness  Genitourinary: Rectal exam shows external hemorrhoid. Rectal exam shows no internal hemorrhoid, no fissure, no mass, no tenderness, anal tone normal and guaiac negative stool.     Genitourinary Comments: No gross blood on rectal exam.    Musculoskeletal: Normal range of motion. She exhibits no edema.  Lymphadenopathy:    She has no cervical adenopathy.  Neurological: She is alert and oriented to person, place, and time.  Skin: Skin is warm and dry. She is not diaphoretic.  Nursing note and vitals reviewed.    ED Treatments / Results  Labs (all labs ordered are listed, but only abnormal results are displayed) Labs Reviewed  COMPREHENSIVE METABOLIC PANEL - Abnormal; Notable for the following:       Result Value   ALT 12 (*)    All other components within normal limits  LIPASE, BLOOD  CBC  URINALYSIS, ROUTINE W REFLEX MICROSCOPIC (NOT AT [facility])  PREGNANCY, URINE  POC OCCULT BLOOD, ED    EKG  EKG Interpretation None       Radiology Dg Chest 2 View  Result Date: 12/22/2015 CLINICAL DATA:  Cough and wheezing; fever EXAM: CHEST  2 VIEW COMPARISON:  February 12, 2011 FINDINGS: The lungs are clear. The heart size and pulmonary vascularity are normal. No adenopathy. No pneumothorax. No bone lesions. IMPRESSION: No edema or consolidation. Electronically Signed   By: Bretta Bang III M.D.   On: 12/22/2015 12:45    Procedures Procedures (including critical care time)  Medications Ordered in ED Medications - No data to display   Initial Impression / Assessment and Plan / ED Course  I have reviewed the triage vital signs and the nursing notes.  Pertinent labs & imaging results that were available during my care of the patient were reviewed by me and considered in my medical decision making (see chart for details).  Clinical Course    Patient presents with abdominal pain with associated vomiting and loose stools that started yesterday morning. Reports noticing a small amount of bright red blood in her stool yesterday, endorses history of external hemorrhoid. Reports fever. VSS. Exam revealed mild tenderness over epigastric region, remaining exam unremarkable, no peritoneal signs. Moist mucous membranes,  patient appears well-hydrated. Patient denies any nausea at this time and declined pain medication. Pregnancy negative. Labs and urine unremarkable. Hemoccult negative. No gross blood noted on rectal exam. Small nonthrombosed external hemorrhoid present, no active bleeding. Pt able to tolerate PO. On reexamination patient is resting comfortably in bed and denies any pain or complaints at this time. Suspect patient's symptoms are likely due to viral gastroenteritis. Do not suspect appendicitis, bowel obstruction, diverticulitis or other acute surgical abdomen at this time warranting further workup/imaging. Discussed results and plan for discharge with patient. Plan to discharge patient home with Zofran, dibucaine ointment for hemorrhoids and symptomatically treatment. Advised to follow up  with PCP if symptoms have not improved in the next 2-3 days. Discussed return precautions.  Patient also presents with worsening nasal congestion, rhinorrhea, sore throat, productive cough and wheezing. Endorses fever. VSS. Exam revealed rhinorrhea. Lungs clear auscultation bilaterally. Remaining exam unremarkable. Chest x-ray negative. Suspect patient's symptoms are likely due to viral URI. Plan to discharge patient home with symptomatically treatment and PCP follow-up as needed. Discussed return precautions.  Final Clinical Impressions(s) / ED Diagnoses   Final diagnoses:  Viral upper respiratory tract infection  Viral gastroenteritis  External hemorrhoids    New Prescriptions Discharge Medication List as of 12/22/2015  1:52 PM    START taking these medications   Details  benzonatate (TESSALON) 100 MG capsule Take 2 capsules (200 mg total) by mouth 2 (two) times daily as needed for cough., Starting Tue 12/22/2015, Print    ondansetron (ZOFRAN ODT) 4 MG disintegrating tablet Take 1 tablet (4 mg total) by mouth every 8 (eight) hours as needed for nausea or vomiting., Starting Tue 12/22/2015, Print      oxymetazoline (AFRIN NASAL SPRAY) 0.05 % nasal spray Place 1 spray into both nostrils 2 (two) times daily. Place 1 spray into both nostrils 2 times daily for up to the next 3 days as needed for nasal congestion. Do not use for more than 3 days to prevent rebound rhinorrhea., Starting Tue 12/22/2015, Print         Satira Sark [place], [place] 12/22/15 1424    Benjiman Core, MD 12/24/15 1659

## 2015-12-22 NOTE — ED Triage Notes (Signed)
Patient states has been fighting a cold since Sunday.   Patient states she started having abdominal pain as of yesterday.   Patient states is having bloody/mucous stools and did have some N/V today.

## 2016-03-07 NOTE — L&D Delivery Note (Signed)
Delivery Note At 1:43 PM a viable female was delivered via Vaginal, Spontaneous Delivery (Presentation: cephalic).  APGAR: 9, 9; weight pending .   Placenta status: intact. 3V Cord:  with the following complications: none.  Cord pH: n/a  Anesthesia:   Episiotomy: None Lacerations: 1st degree Suture Repair: 3.0 vicryl Est. Blood Loss (mL): 600  Mom to postpartum.  Baby to Couplet care / Skin to Skin.  Crystal Newman Y 12/05/2016, 2:19 PM

## 2016-03-31 ENCOUNTER — Emergency Department (HOSPITAL_COMMUNITY)
Admission: EM | Admit: 2016-03-31 | Discharge: 2016-03-31 | Disposition: A | Discharge status: Home / Self Care | Payer: Medicaid Other | Attending: Emergency Medicine | Admitting: Emergency Medicine

## 2016-03-31 ENCOUNTER — Encounter (HOSPITAL_COMMUNITY): Payer: Self-pay

## 2016-03-31 ENCOUNTER — Emergency Department (HOSPITAL_COMMUNITY): Payer: Medicaid Other

## 2016-03-31 DIAGNOSIS — Z79899 Other long term (current) drug therapy: Secondary | ICD-10-CM | POA: Insufficient documentation

## 2016-03-31 DIAGNOSIS — J45909 Unspecified asthma, uncomplicated: Secondary | ICD-10-CM | POA: Insufficient documentation

## 2016-03-31 DIAGNOSIS — J111 Influenza due to unidentified influenza virus with other respiratory manifestations: Secondary | ICD-10-CM | POA: Insufficient documentation

## 2016-03-31 DIAGNOSIS — F17200 Nicotine dependence, unspecified, uncomplicated: Secondary | ICD-10-CM | POA: Insufficient documentation

## 2016-03-31 MED ORDER — ACETAMINOPHEN 325 MG PO TABS
650.0000 mg | ORAL_TABLET | Freq: Once | ORAL | Status: AC | PRN
  Administered 2016-03-31: 650 mg via ORAL
  Filled 2016-03-31: qty 2

## 2016-03-31 MED ORDER — GUAIFENESIN-CODEINE 100-10 MG/5ML PO SOLN: 5.0000 mL | Freq: Three times a day (TID) | ORAL | 0 refills | Status: DC | PRN

## 2016-03-31 NOTE — ED Triage Notes (Signed)
PT C/O COUGH, FEVER, BODY ACHES, AND EARACHES SINCE LAST NIGHT. DENIES N/V/D. PT STS SHE TOOK NYQUIL LAST NIGHT W/O RELIEF.

## 2016-03-31 NOTE — ED Provider Notes (Signed)
[facility]-EMERGENCY DEPT Provider Note   CSN: [id number] Arrival date & time: 03/31/16  1513   History   Chief Complaint Chief Complaint  Patient presents with  . Influenza    HPI Crystal Newman is a 26 y.o. female.  HPI   Pt has PMH of anxiety, asthma, seizures, frequent UTIs and syncope comes to the ER complaining of cough, fever, body aches and ear aches that started last night. She has not had any n/v/d. She has tried Nyquil for her symptoms but says it did not help. In triage initially her temp was 100.4, after being given 650 mg tylenol it improved to 98.7. She says that her chills and hot flashes as well as the coughing are her most troublesome symptoms. She is tolerating PO well. She has good energy.  Past Medical History:  Diagnosis Date  . Anxiety   . Asthma    sports induced  . Infection    UTI-frequent  . Seizures (HCC)    unknown cause, last at age 26  . Short PR-normal QRS complex syndrome   . Syncope    neurally-mediated    Patient Active Problem List   Diagnosis Date Noted  . Vaginal delivery 02/11/2015  . Short cervix, antepartum 02/01/2015  . 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 Short cervix affecting pregnancy 11/18/2014  . 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 Rubella non-immune status, antepartum 11/18/2014  . 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 Hidradenitis 07/21/2011  . HYPOTENSION, ORTHOSTATIC 01/21/2009    Past Surgical History:  Procedure Laterality Date  . TONSILLECTOMY    . WISDOM TOOTH EXTRACTION      OB History    Gravida Para Term Preterm AB Living   6 1 1   5 1    SAB TAB Ectopic Multiple Live Births   5     0 1       Home Medications    Prior to Admission medications   Medication Sig Start Date End Date Taking? Authorizing Provider  amoxicillin (AMOXIL) 875 MG tablet Take 875 mg by mouth 2 (two) times daily.    Historical Provider, MD  benzonatate (TESSALON) 100 MG capsule Take 2 capsules (200 mg  total) by mouth 2 (two) times daily as needed for cough. 12/22/15   Barrett HenleNicole Elizabeth Nadeau, PA-C  dibucaine (NUPERCAINAL) 1 % ointment Apply topically 3 (three) times daily as needed for pain. 12/22/15   Barrett HenleNicole Elizabeth Nadeau, PA-C  guaiFENesin-codeine 100-10 MG/5ML syrup Take 5-10 mLs by mouth 3 (three) times daily as needed for cough. 03/31/16   Ory Elting Neva SeatGreene, PA-C  ibuprofen (ADVIL,MOTRIN) 600 MG tablet Take 1 tablet (600 mg total) by mouth every 6 (six) hours as needed. Patient taking differently: Take 600 mg by mouth every 6 (six) hours as needed for moderate pain.  02/12/15   Nigel BridgemanVicki Latham, CNM  norethindrone (ORTHO MICRONOR) 0.35 MG tablet Take 1 tablet (0.35 mg total) by mouth daily. Patient not taking: Reported on 12/22/2015 02/12/15   Nigel BridgemanVicki Latham, CNM  ondansetron (ZOFRAN ODT) 4 MG disintegrating tablet Take 1 tablet (4 mg total) by mouth every 8 (eight) hours as needed for nausea or vomiting. 12/22/15   Barrett HenleNicole Elizabeth Nadeau, PA-C  oxyCODONE-acetaminophen (PERCOCET/ROXICET) 5-325 MG tablet Take 1 tablet by mouth every 4 (four) hours as needed (for pain scale 4-7). Patient not taking: Reported on 12/22/2015 02/12/15   Nigel BridgemanVicki Latham, CNM  oxymetazoline (AFRIN NASAL SPRAY) 0.05 % nasal spray Place 1 spray into both nostrils 2 (two) times daily.  Place 1 spray into both nostrils 2 times daily for up to the next 3 days as needed for nasal congestion. Do not use for more than 3 days to prevent rebound rhinorrhea. 12/22/15   Barrett Henle, PA-C  Probiotic Product (PROBIOTIC PO) Take 1 tablet by mouth daily.    Historical Provider, MD  sertraline (ZOLOFT) 50 MG tablet Take 50 mg by mouth daily.    Historical Provider, MD    Family History Family History  Problem Relation Age of Onset  . Hypertension Father   . Cancer Maternal Aunt     ovarian  . Diabetes Maternal Grandmother   . Heart disease Maternal Grandmother   . Hypertension Maternal Grandmother   . Heart disease Maternal  Grandfather   . Diabetes Paternal Grandmother   . Heart disease Paternal Grandmother   . Cancer Paternal Grandfather     lung    Social History Social History  Substance Use Topics  . Smoking status: Current Every Day Smoker    Packs/day: 0.50    Years: 8.00  . Smokeless tobacco: Never Used  . Alcohol use No     Allergies   Patient has no known allergies.   Review of Systems Review of Systems Review of Systems All other systems negative except as documented in the HPI. All pertinent positives and negatives as reviewed in the HPI.   Physical Exam Updated Vital Signs BP 103/72 (BP Location: Left Arm)   Pulse 99   Temp 98.7 F (37.1 C) (Oral)   Resp 20   Ht 5\' 6"  (1.676 m)   Wt 79.4 kg   LMP 03/07/2016   SpO2 99%   BMI 28.25 kg/m   Physical Exam  Constitutional: She appears well-developed and well-nourished. No distress.  HENT:  Head: Normocephalic and atraumatic.  Right Ear: Tympanic membrane and ear canal normal.  Left Ear: Tympanic membrane and ear canal normal.  Nose: Nose normal.  Mouth/Throat: Uvula is midline, oropharynx is clear and moist and mucous membranes are normal.  Eyes: Pupils are equal, round, and reactive to light.  Neck: Normal range of motion. Neck supple.  Cardiovascular: Normal rate and regular rhythm.   Pulmonary/Chest: Effort normal.  Abdominal: Soft.  No signs of abdominal distention  Musculoskeletal:  No LE swelling  Neurological: She is alert.  Acting at baseline  Skin: Skin is warm and dry. No rash noted.  Nursing note and vitals reviewed.    ED Treatments / Results  Labs (all labs ordered are listed, but only abnormal results are displayed) Labs Reviewed - No data to display  EKG  EKG Interpretation None       Radiology Dg Chest 2 View  Result Date: 03/31/2016 CLINICAL DATA:  Mid chest pain, nonproductive cough, shortness of breath, and fever since yesterday. Current smoker. History of exercise-induced asthma.  EXAM: CHEST  2 VIEW COMPARISON:  PA and lateral chest x-ray of December 22, 2015 FINDINGS: The lungs are adequately inflated and clear. The heart and pulmonary vascularity are normal. The mediastinum is normal in width. There is no pleural effusion. The bony thorax exhibits no acute abnormality.  IMPRESSION: There is no pneumonia nor other acute cardiopulmonary abnormality. Electronically Signed   By: David  [place] M.D.   On: 03/31/2016 16:05    Procedures Procedures (including critical care time)  Medications Ordered in ED Medications  acetaminophen (TYLENOL) tablet 650 mg (650 mg Oral Given 03/31/16 1557)     Initial Impression / Assessment and Plan / ED  Course  I have reviewed the triage vital signs and the nursing notes.  Pertinent labs & imaging results that were available during my care of the patient were reviewed by me and considered in my medical decision making (see chart for details).     Patient with symptoms consistent with influenza.  Vitals are stable, low-grade fever.  No signs of dehydration, tolerating PO's.  Lungs are clear. Due to patient's presentation and physical exam a chest x-ray was not ordered bc likely diagnosis of flu.  Discussed the cost versus benefit of Tamiflu treatment with the patient.  The patient understands that symptoms are greater than the recommended 24-48 hour window of treatment.  Patient will be discharged with instructions to orally hydrate, rest, and use over-the-counter medications such as anti-inflammatories ibuprofen and Aleve for muscle aches and Tylenol for fever.  Patient will also be given a cough suppressant.    Final Clinical Impressions(s) / ED Diagnoses   Final diagnoses:  Influenza    New Prescriptions New Prescriptions   GUAIFENESIN-CODEINE 100-10 MG/5ML SYRUP    Take 5-10 mLs by mouth 3 (three) times daily as needed for cough.     Marlon Pel, PA-C 03/31/16 1825    Shaune Pollack, MD 04/02/16 1040

## 2016-05-30 ENCOUNTER — Inpatient Hospital Stay (HOSPITAL_COMMUNITY)
Admission: AD | Admit: 2016-05-30 | Discharge: 2016-05-30 | Disposition: A | Discharge status: Home / Self Care | Payer: Medicaid Other | Source: Ambulatory Visit | Attending: Obstetrics and Gynecology | Admitting: Obstetrics and Gynecology

## 2016-05-30 ENCOUNTER — Inpatient Hospital Stay (HOSPITAL_COMMUNITY): Payer: Medicaid Other

## 2016-05-30 ENCOUNTER — Encounter (HOSPITAL_COMMUNITY): Payer: Self-pay | Admitting: *Deleted

## 2016-05-30 DIAGNOSIS — O26891 Other specified pregnancy related conditions, first trimester: Secondary | ICD-10-CM | POA: Insufficient documentation

## 2016-05-30 DIAGNOSIS — Z8249 Family history of ischemic heart disease and other diseases of the circulatory system: Secondary | ICD-10-CM | POA: Insufficient documentation

## 2016-05-30 DIAGNOSIS — R42 Dizziness and giddiness: Secondary | ICD-10-CM | POA: Insufficient documentation

## 2016-05-30 DIAGNOSIS — F17210 Nicotine dependence, cigarettes, uncomplicated: Secondary | ICD-10-CM | POA: Insufficient documentation

## 2016-05-30 DIAGNOSIS — Z3491 Encounter for supervision of normal pregnancy, unspecified, first trimester: Secondary | ICD-10-CM

## 2016-05-30 DIAGNOSIS — O26899 Other specified pregnancy related conditions, unspecified trimester: Secondary | ICD-10-CM

## 2016-05-30 DIAGNOSIS — O99331 Smoking (tobacco) complicating pregnancy, first trimester: Secondary | ICD-10-CM | POA: Insufficient documentation

## 2016-05-30 DIAGNOSIS — N939 Abnormal uterine and vaginal bleeding, unspecified: Secondary | ICD-10-CM | POA: Diagnosis present

## 2016-05-30 DIAGNOSIS — Z3A13 13 weeks gestation of pregnancy: Secondary | ICD-10-CM | POA: Insufficient documentation

## 2016-05-30 DIAGNOSIS — R109 Unspecified abdominal pain: Secondary | ICD-10-CM | POA: Diagnosis not present

## 2016-05-30 DIAGNOSIS — O209 Hemorrhage in early pregnancy, unspecified: Secondary | ICD-10-CM

## 2016-05-30 LAB — URINALYSIS, ROUTINE W REFLEX MICROSCOPIC
BILIRUBIN URINE: NEGATIVE
Glucose, UA: NEGATIVE mg/dL
KETONES UR: NEGATIVE mg/dL
Leukocytes, UA: NEGATIVE
Nitrite: NEGATIVE
PH: 7 (ref 5.0–8.0)
Protein, ur: NEGATIVE mg/dL
Specific Gravity, Urine: 1.01 (ref 1.005–1.030)

## 2016-05-30 LAB — WET PREP, GENITAL
CLUE CELLS WET PREP: NONE SEEN
Sperm: NONE SEEN
Trich, Wet Prep: NONE SEEN
Yeast Wet Prep HPF POC: NONE SEEN

## 2016-05-30 LAB — URINALYSIS, MICROSCOPIC (REFLEX): RBC / HPF: NONE SEEN RBC/hpf (ref 0–5)

## 2016-05-30 LAB — CBC
HCT: 37.6 % (ref 36.0–46.0)
Hemoglobin: 12.7 g/dL (ref 12.0–15.0)
MCH: 31.3 pg (ref 26.0–34.0)
MCHC: 33.8 g/dL (ref 30.0–36.0)
MCV: 92.6 fL (ref 78.0–100.0)
PLATELETS: 223 10*3/uL (ref 150–400)
RBC: 4.06 MIL/uL (ref 3.87–5.11)
RDW: 13.8 % (ref 11.5–15.5)
WBC: 12.5 10*3/uL — ABNORMAL HIGH (ref 4.0–10.5)

## 2016-05-30 LAB — HCG, QUANTITATIVE, PREGNANCY: hCG, Beta Chain, Quant, S: 36814 m[IU]/mL — ABNORMAL HIGH (ref <5)

## 2016-05-30 LAB — POCT PREGNANCY, URINE: Preg Test, Ur: POSITIVE — AB

## 2016-05-30 NOTE — MAU Note (Signed)
Pt woke up @ 0800, underwear & pants soaked with blood this morning, toilet was full.  Now seeing blood with wiping.  Hx of miscarriage x 5.  Also having lower abd cramping since 0900.  Was having back pain last night.  Pos HPT & pos UPT @ [place] [facility], also had an U/S there.

## 2016-05-30 NOTE — Discharge Instructions (Signed)
Vaginal Bleeding During Pregnancy, First Trimester A small amount of bleeding (spotting) from the vagina is relatively common in early pregnancy. It usually stops on its own. Various things may cause bleeding or spotting in early pregnancy. Some bleeding may be related to the pregnancy, and some may not. In most cases, the bleeding is normal and is not a problem. However, bleeding can also be a sign of something serious. Be sure to tell your health care provider about any vaginal bleeding right away. Some possible causes of vaginal bleeding during the first trimester include:  Infection or inflammation of the cervix.  Growths (polyps) on the cervix.  Miscarriage or threatened miscarriage.  Pregnancy tissue has developed outside of the uterus and in a fallopian tube (tubal pregnancy).  Tiny cysts have developed in the uterus instead of pregnancy tissue (molar pregnancy). Follow these instructions at home: Watch your condition for any changes. The following actions may help to lessen any discomfort you are feeling:  Follow your health care provider's instructions for limiting your activity. If your health care provider orders bed rest, you may need to stay in bed and only get up to use the bathroom. However, your health care provider may allow you to continue light activity.  If needed, make plans for someone to help with your regular activities and responsibilities while you are on bed rest.  Keep track of the number of pads you use each day, how often you change pads, and how soaked (saturated) they are. Write this down.  Do not use tampons. Do not douche.  Do not have sexual intercourse or orgasms until approved by your health care provider.  If you pass any tissue from your vagina, save the tissue so you can show it to your health care provider.  Only take over-the-counter or prescription medicines as directed by your health care provider.  Do not take aspirin because it can make you  bleed.  Keep all follow-up appointments as directed by your health care provider. Contact a health care provider if:  You have any vaginal bleeding during any part of your pregnancy.  You have cramps or labor pains.  You have a fever, not controlled by medicine. Get help right away if:  You have severe cramps in your back or belly (abdomen).  You pass large clots or tissue from your vagina.  Your bleeding increases.  You feel light-headed or weak, or you have fainting episodes.  You have chills.  You are leaking fluid or have a gush of fluid from your vagina.  You pass out while having a bowel movement. This information is not intended to replace advice given to you by your health care provider. Make sure you discuss any questions you have with your health care provider. Document Released: 12/01/2004 Document Revised: 07/30/2015 Document Reviewed: 10/29/2012 Elsevier Interactive Patient Education  2017 Elsevier [employer].      [facility] Prenatal Care Providers   *Center for [employer] at [facility]       Phone: [phone]  *Center for [facility] at [place] Phone: [phone]  *Center for [facility] at [place]  Phone: [phone]  *[facility] at [facility]  Phone: [phone]  *Center for [facility] at [facility]  Phone: [phone]  [facility]       Phone: [phone]  [facility]    Phone: [phone]   *[facility] [facility]    Phone: [phone]  [facility]  Phone: [phone]  [facility]    Phone: [phone]   [facility]  Phone: [phone]  [facility]    Phone: [phone]  [facility]   Phone: [phone]  [facility]    Phone: [phone]

## 2016-05-30 NOTE — MAU Provider Note (Signed)
History     CSN: [id number]  Arrival date and time: 05/30/16 1010   None     Chief Complaint  Patient presents with  . Vaginal Bleeding  . Abdominal Pain   HPI Pt is a 26y/o G7P1051 O positive blood type with a hx of multiple spontaneous abortions who presents today with vaginal bleeding, abdominal cramping, and back pain. She stated she woke up around 815 this morning with blood soaked through pants onto bed. She has not had much bleeding since, only with wiping. Abdominal cramping and back pain started around the same time. Though she has had intermittent episodes of shooting abdominal pain on right side over the past few weeks. The pain usually last a few mins and resolves on its own. Has also noticed morning dizziness but not currently dizzyy. Has not tried anything to relieve the pain. Reports [place] with heartbeat at 7 weeks.    Past Medical History:  Diagnosis Date  . Anxiety   . Asthma    sports induced  . Infection    UTI-frequent  . Seizures (HCC)    unknown cause, last at age 53  . Short PR-normal QRS complex syndrome   . Syncope    neurally-mediated    Past Surgical History:  Procedure Laterality Date  . TONSILLECTOMY    . WISDOM TOOTH EXTRACTION      Family History  Problem Relation Age of Onset  . Hypertension Father   . Cancer Maternal Aunt     ovarian  . Diabetes Maternal Grandmother   . Heart disease Maternal Grandmother   . Hypertension Maternal Grandmother   . Heart disease Maternal Grandfather   . Diabetes Paternal Grandmother   . Heart disease Paternal Grandmother   . Cancer Paternal Grandfather     lung    Social History  Substance Use Topics  . Smoking status: Current Every Day Smoker    Packs/day: 0.50    Years: 8.00  . Smokeless tobacco: Never Used  . Alcohol use No    Allergies: No Known Allergies  Prescriptions Prior to Admission  Medication Sig Dispense Refill Last Dose  . amoxicillin (AMOXIL) 875 MG tablet Take 875 mg by mouth  2 (two) times daily.   Past Week at Unknown time  . benzonatate (TESSALON) 100 MG capsule Take 2 capsules (200 mg total) by mouth 2 (two) times daily as needed for cough. 20 capsule 0   . dibucaine (NUPERCAINAL) 1 % ointment Apply topically 3 (three) times daily as needed for pain. 30 g 0   . guaiFENesin-codeine 100-10 MG/5ML syrup Take 5-10 mLs by mouth 3 (three) times daily as needed for cough. 120 mL 0   . ibuprofen (ADVIL,MOTRIN) 600 MG tablet Take 1 tablet (600 mg total) by mouth every 6 (six) hours as needed. (Patient taking differently: Take 600 mg by mouth every 6 (six) hours as needed for moderate pain. ) 30 tablet 2 12/21/2015 at Unknown time  . norethindrone (ORTHO MICRONOR) 0.35 MG tablet Take 1 tablet (0.35 mg total) by mouth daily. (Patient not taking: Reported on 12/22/2015) 1 Package 11 Not Taking at Unknown time  . ondansetron (ZOFRAN ODT) 4 MG disintegrating tablet Take 1 tablet (4 mg total) by mouth every 8 (eight) hours as needed for nausea or vomiting. 10 tablet 0   . oxyCODONE-acetaminophen (PERCOCET/ROXICET) 5-325 MG tablet Take 1 tablet by mouth every 4 (four) hours as needed (for pain scale 4-7). (Patient not taking: Reported on 12/22/2015) 30 tablet 0 Completed  Course at Unknown time  . oxymetazoline (AFRIN NASAL SPRAY) 0.05 % nasal spray Place 1 spray into both nostrils 2 (two) times daily. Place 1 spray into both nostrils 2 times daily for up to the next 3 days as needed for nasal congestion. Do not use for more than 3 days to prevent rebound rhinorrhea. 30 mL 0   . Probiotic Product (PROBIOTIC PO) Take 1 tablet by mouth daily.   Past Week at Unknown time  . sertraline (ZOLOFT) 50 MG tablet Take 50 mg by mouth daily.   Past Week at Unknown time    Review of Systems  Constitutional: Negative for chills and fever.  Respiratory: Negative for chest tightness and shortness of breath.   Cardiovascular: Negative for chest pain.  Gastrointestinal: Positive for abdominal pain.  Negative for blood in stool, constipation and nausea.  Genitourinary: Positive for vaginal bleeding. Negative for difficulty urinating, dysuria and frequency.  Neurological: Positive for dizziness.   Physical Exam   Blood pressure 102/69, pulse 85, temperature 97.9 F (36.6 C), temperature source Oral, resp. rate 17, height 5\' 7"  (1.702 m), weight 190 lb (86.2 kg), last menstrual period 02/29/2016, SpO2 100 %, unknown if currently breastfeeding.  Physical Exam  Constitutional: She is oriented to person, place, and time. She appears well-developed and well-nourished.  HENT:  Head: Normocephalic and atraumatic.  Cardiovascular: Normal rate, regular rhythm and normal heart sounds.   Respiratory: Effort normal and breath sounds normal.  GI: Soft. There is tenderness (to lower abdomen).  Genitourinary: Uterus is enlarged. Cervix exhibits discharge (Blood). Right adnexum displays no mass. Left adnexum displays mass. There is bleeding in the vagina. No tenderness in the vagina.  Genitourinary Comments: Cervical Os is closed.   Neurological: She is alert and oriented to person, place, and time.  Skin: Skin is warm and dry.  Psychiatric: She has a normal mood and affect.    MAU Course  Procedures Results for orders placed or performed during the hospital encounter of 05/30/16 (from the past 24 hour(s))  Urinalysis, Routine w reflex microscopic     Status: Abnormal   Collection Time: 05/30/16 10:15 AM  Result Value Ref Range   Color, Urine YELLOW YELLOW   APPearance CLEAR CLEAR   Specific Gravity, Urine 1.010 1.005 - 1.030   pH 7.0 5.0 - 8.0   Glucose, UA NEGATIVE NEGATIVE mg/dL   Hgb urine dipstick LARGE (A) NEGATIVE   Bilirubin Urine NEGATIVE NEGATIVE   Ketones, ur NEGATIVE NEGATIVE mg/dL   Protein, ur NEGATIVE NEGATIVE mg/dL   Nitrite NEGATIVE NEGATIVE   Leukocytes, UA NEGATIVE NEGATIVE  Urinalysis, Microscopic (reflex)     Status: Abnormal   Collection Time: 05/30/16 10:15 AM   Result Value Ref Range   RBC / HPF NONE SEEN 0 - 5 RBC/hpf   WBC, UA 0-5 0 - 5 WBC/hpf   Bacteria, UA RARE (A) NONE SEEN   Squamous Epithelial / LPF 0-5 (A) NONE SEEN  Pregnancy, urine POC     Status: Abnormal   Collection Time: 05/30/16 10:23 AM  Result Value Ref Range   Preg Test, Ur POSITIVE (A) NEGATIVE  CBC     Status: Abnormal   Collection Time: 05/30/16 10:56 AM  Result Value Ref Range   WBC 12.5 (H) 4.0 - 10.5 K/uL   RBC 4.06 3.87 - 5.11 MIL/uL   Hemoglobin 12.7 12.0 - 15.0 g/dL   HCT 16.137.6 09.636.0 - 04.546.0 %   MCV 92.6 78.0 - 100.0 fL   MCH  31.3 26.0 - 34.0 pg   MCHC 33.8 30.0 - 36.0 g/dL   RDW 16.1 09.6 - 04.5 %   Platelets 223 150 - 400 K/uL  hCG, quantitative, pregnancy     Status: Abnormal   Collection Time: 05/30/16 10:57 AM  Result Value Ref Range   hCG, Beta Chain, Quant, S 36,814 (H) <5 mIU/mL  Wet prep, genital     Status: Abnormal   Collection Time: 05/30/16 11:10 AM  Result Value Ref Range   Yeast Wet Prep HPF POC NONE SEEN NONE SEEN   Trich, Wet Prep NONE SEEN NONE SEEN   Clue Cells Wet Prep HPF POC NONE SEEN NONE SEEN   WBC, Wet Prep HPF POC FEW (A) NONE SEEN   Sperm NONE SEEN    US Ob Comp Less 14 Wks  Result Date: 05/30/2016 CLINICAL DATA:  Pain, bleeding since 8 a.m. EXAM: OBSTETRIC <14 WK ULTRASOUND TECHNIQUE: Transabdominal ultrasound was performed for evaluation of the gestation as well as the maternal uterus and adnexal regions. COMPARISON:  None. FINDINGS: Intrauterine gestational sac: Single Yolk sac:  Not visualized Embryo:  Visualized Cardiac Activity: Visualized Heart Rate: 164 bpm MSD:   mm    w     d CRL:   67.2  mm   13 w 0 d                  [place] EDC: 12/05/2016 Subchorionic hemorrhage:  None visualized. Maternal uterus/adnexae: No adnexal mass or free fluid. IMPRESSION: Thirteen week intrauterine pregnancy. Fetal heart rate 164 beats per minute. No acute maternal findings. Electronically Signed   By: Charlett Nose M.D.   On: 05/30/2016 12:01     MDM Urine pregnancy positive here today. Ordered CBC, ultrasound, GC, Wet prep, and HIV tests. UA negative for UTI.   Assessment and Plan    Melburn Popper 05/30/2016, 11:01 AM     I confirm that I have verified the information documented in the PA student's note and that I have also personally reperformed the physical exam and all medical decision making activities.   Unable to doppler FHTs -- ultrasound shows SIUP measuring [redacted]w[redacted]d c/w LMP with cardiac activity. On exam, cervix was closed with minimal amount of rust colored mucoid blood; no active bleeding. Patient is O positive blood type.   A:  1. Normal IUP (intrauterine pregnancy) on prenatal ultrasound, first trimester   2. Vaginal bleeding in pregnancy, first trimester   3. Abdominal cramping affecting pregnancy    P: Discharge home Pelvic rest Start prenatal care Discussed reasons to return to MAU GC/CT pending  Judeth Horn, NP

## 2016-05-31 LAB — GC/CHLAMYDIA PROBE AMP (~~LOC~~) NOT AT ARMC
Chlamydia: NEGATIVE
Neisseria Gonorrhea: NEGATIVE

## 2016-05-31 LAB — HIV ANTIBODY (ROUTINE TESTING W REFLEX): HIV SCREEN 4TH GENERATION: NONREACTIVE

## 2016-07-21 ENCOUNTER — Encounter: Payer: Medicaid Other | Admitting: Obstetrics & Gynecology

## 2016-07-21 ENCOUNTER — Other Ambulatory Visit (HOSPITAL_COMMUNITY)
Admission: RE | Admit: 2016-07-21 | Discharge: 2016-07-21 | Disposition: A | Discharge status: Home / Self Care | Payer: Medicaid Other | Source: Ambulatory Visit | Attending: Obstetrics | Admitting: Obstetrics

## 2016-07-21 ENCOUNTER — Encounter: Payer: Self-pay | Admitting: Obstetrics

## 2016-07-21 ENCOUNTER — Ambulatory Visit (INDEPENDENT_AMBULATORY_CARE_PROVIDER_SITE_OTHER): Payer: Medicaid Other | Admitting: Obstetrics

## 2016-07-21 VITALS — BP 127/78 | HR 117 | Wt 182.9 lb

## 2016-07-21 DIAGNOSIS — N898 Other specified noninflammatory disorders of vagina: Secondary | ICD-10-CM

## 2016-07-21 DIAGNOSIS — Z3481 Encounter for supervision of other normal pregnancy, first trimester: Secondary | ICD-10-CM | POA: Diagnosis not present

## 2016-07-21 DIAGNOSIS — O0992 Supervision of high risk pregnancy, unspecified, second trimester: Secondary | ICD-10-CM | POA: Diagnosis not present

## 2016-07-21 DIAGNOSIS — O0990 Supervision of high risk pregnancy, unspecified, unspecified trimester: Secondary | ICD-10-CM | POA: Diagnosis present

## 2016-07-21 DIAGNOSIS — Z3490 Encounter for supervision of normal pregnancy, unspecified, unspecified trimester: Secondary | ICD-10-CM | POA: Insufficient documentation

## 2016-07-21 DIAGNOSIS — Z3A20 20 weeks gestation of pregnancy: Secondary | ICD-10-CM | POA: Insufficient documentation

## 2016-07-21 DIAGNOSIS — Z8759 Personal history of other complications of pregnancy, childbirth and the puerperium: Secondary | ICD-10-CM

## 2016-07-21 DIAGNOSIS — O09219 Supervision of pregnancy with history of pre-term labor, unspecified trimester: Secondary | ICD-10-CM

## 2016-07-21 DIAGNOSIS — O09212 Supervision of pregnancy with history of pre-term labor, second trimester: Secondary | ICD-10-CM | POA: Diagnosis not present

## 2016-07-21 LAB — OB RESULTS CONSOLE GC/CHLAMYDIA
CHLAMYDIA, DNA PROBE: NEGATIVE
GC PROBE AMP, GENITAL: NEGATIVE

## 2016-07-21 LAB — OB RESULTS CONSOLE GBS: GBS: POSITIVE

## 2016-07-21 MED ORDER — HYDROXYPROGESTERONE CAPROATE 250 MG/ML IM OIL
250.0000 mg | TOPICAL_OIL | Freq: Once | INTRAMUSCULAR | Status: AC
  Administered 2016-07-21: 250 mg via INTRAMUSCULAR

## 2016-07-21 NOTE — Progress Notes (Signed)
Subjective:    Crystal Newman is being seen today for her first obstetrical visit.  This is a planned pregnancy. She is at [redacted]w[redacted]d gestation. 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  Possible cervical insufficiency.. Relationship with FOB: spouse, living together. Patient does intend to breast feed. Pregnancy history fully reviewed.  The information documented in the HPI was reviewed and verified.  Menstrual History: OB History    Gravida Para Term Preterm AB Living   7 1 1   5 1    SAB TAB Ectopic Multiple Live Births   5     0 1       Patient's last menstrual period was 02/29/2016.    Past Medical History:  Diagnosis Date  . Anxiety   . Asthma    sports induced  . Infection    UTI-frequent  . Seizures (HCC)    unknown cause, last at age 57  . Short PR-normal QRS complex syndrome   . Syncope    neurally-mediated    Past Surgical History:  Procedure Laterality Date  . TONSILLECTOMY    . WISDOM TOOTH EXTRACTION       (Not in a hospital admission) No Known Allergies  Social History  Substance Use Topics  . Smoking status: Current Every Day Smoker    Packs/day: 0.50    Years: 8.00  . Smokeless tobacco: Never Used  . Alcohol use No    Family History  Problem Relation Age of Onset  . Hypertension Father   . Cancer Maternal Aunt        ovarian  . Diabetes Maternal Grandmother   . Heart disease Maternal Grandmother   . Hypertension Maternal Grandmother   . Heart disease Maternal Grandfather   . Diabetes Paternal Grandmother   . Heart disease Paternal Grandmother   . Cancer Paternal Grandfather        lung  . Hypertension Mother      Review of Systems Constitutional: negative for weight loss Gastrointestinal: negative for vomiting Genitourinary:negative for genital lesions and vaginal discharge and dysuria Musculoskeletal:negative for back pain Behavioral/Psych: negative for abusive relationship, depression, illegal drug usage and tobacco  use    Objective:    BP 127/78   Pulse (!) 117   Wt 182 lb 14.4 oz (83 kg)   LMP 02/29/2016   BMI 28.65 kg/m  General Appearance:    Alert, cooperative, no distress, appears stated age  Head:    Normocephalic, without obvious abnormality, atraumatic  Eyes:    PERRL, conjunctiva/corneas clear, EOM's intact, fundi    benign, both eyes  Ears:    Normal TM's and external ear canals, both ears  Nose:   Nares normal, septum midline, mucosa normal, no drainage    or sinus tenderness  Throat:   Lips, mucosa, and tongue normal; teeth and gums normal  Neck:   Supple, symmetrical, trachea midline, no adenopathy;    thyroid:  no enlargement/tenderness/nodules; no carotid   bruit or JVD  Back:     Symmetric, no curvature, ROM normal, no CVA tenderness  Lungs:     Clear to auscultation bilaterally, respirations unlabored  Chest Wall:    No tenderness or deformity   Heart:    Regular rate and rhythm, S1 and S2 normal, no murmur, rub   or gallop  Breast Exam:    No tenderness, masses, or nipple abnormality  Abdomen:     Soft, non-tender, bowel sounds active all four quadrants,    no masses,  no organomegaly  Genitalia:    Normal female without lesion, discharge or tenderness  Extremities:   Extremities normal, atraumatic, no cyanosis or edema  Pulses:   2+ and symmetric all extremities  Skin:   Skin color, texture, turgor normal, no rashes or lesions  Lymph nodes:   Cervical, supraclavicular, and axillary nodes normal  Neurologic:   CNII-XII intact, normal strength, sensation and reflexes    throughout      Lab Review Urine pregnancy test Labs reviewed yes Radiologic studies reviewed yes Assessment:    Pregnancy at 5952w3d weeks    Plan:      Prenatal vitamins.  Counseling provided regarding continued use of seat belts, cessation of alcohol consumption, smoking or use of illicit drugs; infection precautions i.e., influenza/TDAP immunizations, toxoplasmosis,CMV, parvovirus, listeria and  varicella; workplace safety, exercise during pregnancy; routine dental care, safe medications, sexual activity, hot tubs, saunas, pools, travel, caffeine use, fish and methlymercury, potential toxins, hair treatments, varicose veins Weight gain recommendations per IOM guidelines reviewed: underweight/BMI< 18.5--> gain 28 - 40 lbs; normal weight/BMI 18.5 - 24.9--> gain 25 - 35 lbs; overweight/BMI 25 - 29.9--> gain 15 - 25 lbs; obese/BMI >30->gain  11 - 20 lbs Problem list reviewed and updated. FIRST/CF mutation testing/NIPT/QUAD SCREEN/fragile X/Ashkenazi Jewish population testing/Spinal muscular atrophy discussed: requested. Role of ultrasound in pregnancy discussed; fetal survey: requested. Amniocentesis discussed: not indicated. VBAC calculator score: VBAC consent form provided Meds ordered this encounter  Medications  . cyclobenzaprine (FLEXERIL) 10 MG tablet    Sig: Take 10 mg by mouth 3 (three) times daily as needed for muscle spasms.  . hydroxyprogesterone caproate (MAKENA) 250 mg/mL injection 250 mg   Orders Placed This Encounter  Procedures  . Culture, OB Urine  . US MFM OB COMP + 14 WK    Standing Status:   Future    Standing Expiration Date:   09/20/2017    Order Specific Question:   Reason for Exam (SYMPTOM  OR DIAGNOSIS REQUIRED)    Answer:   Anatomic survey.  History of cervical insufficiency    Order Specific Question:   Preferred imaging location?    Answer:   [facility]  . Cystic Fibrosis Mutation 97  . Hemoglobinopathy evaluation  . Obstetric Panel, Including HIV  . Varicella zoster antibody, IgG  . AFP, Quad Screen    FULL QUAD SCREEN    Order Specific Question:   Repeat Sample    Answer:   No    Order Specific Question:   Maternal Race    Answer:   Caucasion    Order Specific Question:   EDD    Answer:   12/05/2016    Order Specific Question:   Pregnancy Donor Egg (Y/N)    Answer:   No    Order Specific Question:   Gest Age at U/S (Wk.Dy)    Answer:   13  week 0 days    Order Specific Question:   LMP:    Answer:   02/29/2016    Order Specific Question:   Number of Fetuses    Answer:   1    Order Specific Question:   Ultrasound Date    Answer:   05/30/2016    Order Specific Question:   Hx of OSB/NTD?    Answer:   No    Order Specific Question:   History of Down Syndrome?    Answer:   No    Order Specific Question:   Maternal IDDM (insulin-dependent diabetes mellitus)  Answer:   No    Order Specific Question:   Maternal Weight (lbs)    Answer:   182  . VITAMIN D 25 Hydroxy (Vit-D Deficiency, Fractures)  . Hemoglobin A1c    Follow up in 1 weeks. 50% of 20 min visit spent on counseling and coordination of care. Patient ID: Crystal Newman, female   DOB: [birth year], 26 y.o.   MRN: [id number]

## 2016-07-21 NOTE — Progress Notes (Signed)
Last Pap 2016 Pt does live with spouse

## 2016-07-22 ENCOUNTER — Encounter (HOSPITAL_COMMUNITY): Payer: Self-pay

## 2016-07-22 ENCOUNTER — Ambulatory Visit (HOSPITAL_COMMUNITY)
Admission: RE | Admit: 2016-07-22 | Discharge: 2016-07-22 | Disposition: A | Discharge status: Home / Self Care | Payer: Medicaid Other | Source: Ambulatory Visit | Attending: Obstetrics | Admitting: Obstetrics

## 2016-07-22 DIAGNOSIS — Z8759 Personal history of other complications of pregnancy, childbirth and the puerperium: Secondary | ICD-10-CM | POA: Insufficient documentation

## 2016-07-22 DIAGNOSIS — Z3A20 20 weeks gestation of pregnancy: Secondary | ICD-10-CM | POA: Insufficient documentation

## 2016-07-22 DIAGNOSIS — O2622 Pregnancy care for patient with recurrent pregnancy loss, second trimester: Secondary | ICD-10-CM | POA: Diagnosis not present

## 2016-07-22 DIAGNOSIS — Z363 Encounter for antenatal screening for malformations: Secondary | ICD-10-CM | POA: Diagnosis not present

## 2016-07-22 DIAGNOSIS — O09212 Supervision of pregnancy with history of pre-term labor, second trimester: Secondary | ICD-10-CM | POA: Insufficient documentation

## 2016-07-22 DIAGNOSIS — O2692 Pregnancy related conditions, unspecified, second trimester: Secondary | ICD-10-CM | POA: Diagnosis not present

## 2016-07-22 DIAGNOSIS — O09219 Supervision of pregnancy with history of pre-term labor, unspecified trimester: Secondary | ICD-10-CM

## 2016-07-22 LAB — CERVICOVAGINAL ANCILLARY ONLY
BACTERIAL VAGINITIS: NEGATIVE
CANDIDA VAGINITIS: POSITIVE — AB
Chlamydia: NEGATIVE
Neisseria Gonorrhea: NEGATIVE
Trichomonas: NEGATIVE

## 2016-07-22 LAB — CYTOLOGY - PAP: Diagnosis: NEGATIVE

## 2016-07-22 LAB — HEMOGLOBIN A1C
ESTIMATED AVERAGE GLUCOSE: 97 mg/dL
HEMOGLOBIN A1C: 5 % (ref 4.8–5.6)

## 2016-07-22 NOTE — Progress Notes (Signed)
Subjective:    Crystal Newman is being seen today for her first obstetrical visit.  This is a planned pregnancy. She is at 6578w4d gestation. Her obstetrical history is significant for habitual abortions.  She has had 5 early 1st trimester SAB's.  Last pregnancy was carried to term, but she was on bedrest from 23 weeks because of contractions, but she did not dilate. She was told that she needed a cerclage for future pregnancies. Relationship with FOB: spouse, living together. Patient does intend to breast feed. Pregnancy history fully reviewed.  The information documented in the HPI was reviewed and verified.  Menstrual History: OB History    Gravida Para Term Preterm AB Living   7 1 1   5 1    SAB TAB Ectopic Multiple Live Births   5     0 1       Patient's last menstrual period was 02/29/2016.    Past Medical History:  Diagnosis Date  . Anxiety   . Asthma    sports induced  . Infection    UTI-frequent  . Seizures (HCC)    unknown cause, last at age 26  . Short PR-normal QRS complex syndrome   . Syncope    neurally-mediated    Past Surgical History:  Procedure Laterality Date  . TONSILLECTOMY    . WISDOM TOOTH EXTRACTION       (Not in a hospital admission) No Known Allergies  Social History  Substance Use Topics  . Smoking status: Current Every Day Smoker    Packs/day: 0.50    Years: 8.00  . Smokeless tobacco: Never Used  . Alcohol use No    Family History  Problem Relation Age of Onset  . Hypertension Father   . Cancer Maternal Aunt        ovarian  . Diabetes Maternal Grandmother   . Heart disease Maternal Grandmother   . Hypertension Maternal Grandmother   . Heart disease Maternal Grandfather   . Diabetes Paternal Grandmother   . Heart disease Paternal Grandmother   . Cancer Paternal Grandfather        lung  . Hypertension Mother      Review of Systems Constitutional: negative for weight loss Gastrointestinal: negative for  vomiting Genitourinary:negative for genital lesions and vaginal discharge and dysuria Musculoskeletal:negative for back pain Behavioral/Psych: negative for abusive relationship, depression, illegal drug usage and tobacco use    Objective:    BP 127/78   Pulse (!) 117   Wt 182 lb 14.4 oz (83 kg)   LMP 02/29/2016   BMI 28.65 kg/m  General Appearance:    Alert, cooperative, no distress, appears stated age  Head:    Normocephalic, without obvious abnormality, atraumatic  Eyes:    PERRL, conjunctiva/corneas clear, EOM's intact, fundi    benign, both eyes  Ears:    Normal TM's and external ear canals, both ears  Nose:   Nares normal, septum midline, mucosa normal, no drainage    or sinus tenderness  Throat:   Lips, mucosa, and tongue normal; teeth and gums normal  Neck:   Supple, symmetrical, trachea midline, no adenopathy;    thyroid:  no enlargement/tenderness/nodules; no carotid   bruit or JVD  Back:     Symmetric, no curvature, ROM normal, no CVA tenderness  Lungs:     Clear to auscultation bilaterally, respirations unlabored  Chest Wall:    No tenderness or deformity   Heart:    Regular rate and rhythm, S1 and S2  normal, no murmur, rub   or gallop  Breast Exam:    No tenderness, masses, or nipple abnormality  Abdomen:     Soft, non-tender, bowel sounds active all four quadrants,    no masses, no organomegaly  Genitalia:    Normal female without lesion, discharge or tenderness  Extremities:   Extremities normal, atraumatic, no cyanosis or edema  Pulses:   2+ and symmetric all extremities  Skin:   Skin color, texture, turgor normal, no rashes or lesions  Lymph nodes:   Cervical, supraclavicular, and axillary nodes normal  Neurologic:   CNII-XII intact, normal strength, sensation and reflexes    throughout      Lab Review Urine pregnancy test Labs reviewed yes Radiologic studies reviewed yes  Assessment:    Pregnancy at [redacted]w[redacted]d weeks    History of preterm labor, bedrest and  17-OH-P starting at 23 weeks, and term delivery with previous pregnancy  History of 5 early 1st trimester SAB's prior to previous pregnancy    Plan:     1. Supervision of high risk pregnancy, antepartum Rx: - Culture, OB Urine - Cystic Fibrosis Mutation 97 - Hemoglobinopathy evaluation - Obstetric Panel, Including HIV - Varicella zoster antibody, IgG - Cytology - PAP - AFP, Quad Screen - VITAMIN D 25 Hydroxy (Vit-D Deficiency, Fractures) - Hemoglobin A1c - hydroxyprogesterone caproate (MAKENA) 250 mg/mL injection 250 mg; Inject 1 mL (250 mg total) into the muscle once.  2. Vaginal discharge Rx: - Cervicovaginal ancillary only  3. High risk pregnancy due to history of preterm labor, antepartum Rx: - [place] MFM OB COMP + 14 WK; Future  4. History of 5 spontaneous abortions, early 1st Trimester  Prenatal vitamins.  Counseling provided regarding continued use of seat belts, cessation of alcohol consumption, smoking or use of illicit drugs; infection precautions i.e., influenza/TDAP immunizations, toxoplasmosis,CMV, parvovirus, listeria and varicella; workplace safety, exercise during pregnancy; routine dental care, safe medications, sexual activity, hot tubs, saunas, pools, travel, caffeine use, fish and methlymercury, potential toxins, hair treatments, varicose veins Weight gain recommendations per IOM guidelines reviewed: underweight/BMI< 18.5--> gain 28 - 40 lbs; normal weight/BMI 18.5 - 24.9--> gain 25 - 35 lbs; overweight/BMI 25 - 29.9--> gain 15 - 25 lbs; obese/BMI >30->gain  11 - 20 lbs Problem list reviewed and updated. FIRST/CF mutation testing/NIPT/QUAD SCREEN/fragile X/Ashkenazi Jewish population testing/Spinal muscular atrophy discussed: requested. Role of ultrasound in pregnancy discussed; fetal survey: requested. Amniocentesis discussed: not indicated.   Meds ordered this encounter  Medications  . cyclobenzaprine (FLEXERIL) 10 MG tablet    Sig: Take 10 mg by mouth 3  (three) times daily as needed for muscle spasms.  . hydroxyprogesterone caproate (MAKENA) 250 mg/mL injection 250 mg   Orders Placed This Encounter  Procedures  . Culture, OB Urine  . [place] MFM OB COMP + 14 WK    Standing Status:   Future    Standing Expiration Date:   09/20/2017    Order Specific Question:   Reason for Exam (SYMPTOM  OR DIAGNOSIS REQUIRED)    Answer:   Anatomic survey.  History of cervical insufficiency    Order Specific Question:   Preferred imaging location?    Answer:   [facility]  . Cystic Fibrosis Mutation 97  . Hemoglobinopathy evaluation  . Obstetric Panel, Including HIV  . Varicella zoster antibody, IgG  . AFP, Quad Screen    FULL QUAD SCREEN    Order Specific Question:   Repeat Sample    Answer:   No  Order Specific Question:   Maternal Race    Answer:   Caucasion    Order Specific Question:   EDD    Answer:   12/05/2016    Order Specific Question:   Pregnancy Donor Egg (Y/N)    Answer:   No    Order Specific Question:   Gest Age at U/S (Wk.Dy)    Answer:   13 week 0 days    Order Specific Question:   LMP:    Answer:   02/29/2016    Order Specific Question:   Number of Fetuses    Answer:   1    Order Specific Question:   Ultrasound Date    Answer:   05/30/2016    Order Specific Question:   Hx of OSB/NTD?    Answer:   No    Order Specific Question:   History of Down Syndrome?    Answer:   No    Order Specific Question:   Maternal IDDM (insulin-dependent diabetes mellitus)    Answer:   No    Order Specific Question:   Maternal Weight (lbs)    Answer:   182  . VITAMIN D 25 Hydroxy (Vit-D Deficiency, Fractures)  . Hemoglobin A1c    Follow up in 1 weeks. 50% of 20 min visit spent on counseling and coordination of care.   Patient ID: Crystal Newman, female   DOB: 1990-03-30, 26 y.o.   MRN: [id number]

## 2016-07-23 ENCOUNTER — Inpatient Hospital Stay (HOSPITAL_COMMUNITY): Payer: Medicaid Other

## 2016-07-23 ENCOUNTER — Inpatient Hospital Stay (HOSPITAL_COMMUNITY)
Admission: AD | Admit: 2016-07-23 | Discharge: 2016-07-24 | Disposition: A | Discharge status: Home / Self Care | Payer: Medicaid Other | Source: Ambulatory Visit | Attending: Obstetrics and Gynecology | Admitting: Obstetrics and Gynecology

## 2016-07-23 ENCOUNTER — Encounter (HOSPITAL_COMMUNITY): Payer: Self-pay | Admitting: *Deleted

## 2016-07-23 DIAGNOSIS — W19XXXA Unspecified fall, initial encounter: Secondary | ICD-10-CM | POA: Diagnosis not present

## 2016-07-23 DIAGNOSIS — Y92009 Unspecified place in unspecified non-institutional (private) residence as the place of occurrence of the external cause: Secondary | ICD-10-CM | POA: Diagnosis not present

## 2016-07-23 DIAGNOSIS — R109 Unspecified abdominal pain: Secondary | ICD-10-CM | POA: Insufficient documentation

## 2016-07-23 DIAGNOSIS — O9A219 Injury, poisoning and certain other consequences of external causes complicating pregnancy, unspecified trimester: Secondary | ICD-10-CM

## 2016-07-23 DIAGNOSIS — Z3A20 20 weeks gestation of pregnancy: Secondary | ICD-10-CM | POA: Insufficient documentation

## 2016-07-23 DIAGNOSIS — S3991XA Unspecified injury of abdomen, initial encounter: Secondary | ICD-10-CM

## 2016-07-23 DIAGNOSIS — O9A212 Injury, poisoning and certain other consequences of external causes complicating pregnancy, second trimester: Secondary | ICD-10-CM | POA: Insufficient documentation

## 2016-07-23 DIAGNOSIS — O26852 Spotting complicating pregnancy, second trimester: Secondary | ICD-10-CM

## 2016-07-23 DIAGNOSIS — M545 Low back pain: Secondary | ICD-10-CM | POA: Diagnosis not present

## 2016-07-23 DIAGNOSIS — O9989 Other specified diseases and conditions complicating pregnancy, childbirth and the puerperium: Secondary | ICD-10-CM | POA: Diagnosis not present

## 2016-07-23 MED ORDER — HYDROMORPHONE HCL 1 MG/ML IJ SOLN
1.0000 mg | Freq: Once | INTRAMUSCULAR | Status: AC
  Administered 2016-07-23: 1 mg via INTRAMUSCULAR
  Filled 2016-07-23: qty 1

## 2016-07-23 NOTE — MAU Note (Signed)
Pt reports she tripped and fell onto her hands and her stomach and knees around  188 this evening. Her back , wrist hurt and her abd is cramping. She did notice some spotting after the fall as well. Pt was told she had an anterior placenta from u/s yesterday.

## 2016-07-23 NOTE — MAU Provider Note (Signed)
History     CSN: [id number]  Arrival date and time: 07/23/16 2234   First Provider Initiated Contact with Patient 07/23/16 2314      Chief Complaint  Patient presents with  . Fall   HPI Ms. Collene LeydenShannon S Kram is a 26 y.o. [id number] at 5914w5d who presents to MAU today with complaint of a fall at home. The patient states that she tripped over a threshold and fell onto her hands and knees. She did hit her abdomen as well. She states that this occurred around 1800 today. She states cramping in her abdomen rated at 5/10 since the fall and 8/10 low back pain as well. She also noted spotting after the fall. She states last intercourse was yesterday. She has not taken anything for pain. She does state normal fetal movement.   OB History    Gravida Para Term Preterm AB Living   7 1 1   5 1    SAB TAB Ectopic Multiple Live Births   5     0 1      Past Medical History:  Diagnosis Date  . Anxiety   . Asthma    sports induced  . Infection    UTI-frequent  . Seizures (HCC)    unknown cause, last at age 26  . Short PR-normal QRS complex syndrome   . Syncope    neurally-mediated    Past Surgical History:  Procedure Laterality Date  . TONSILLECTOMY    . WISDOM TOOTH EXTRACTION      Family History  Problem Relation Age of Onset  . Hypertension Father   . Cancer Maternal Aunt        ovarian  . Diabetes Maternal Grandmother   . Heart disease Maternal Grandmother   . Hypertension Maternal Grandmother   . Heart disease Maternal Grandfather   . Diabetes Paternal Grandmother   . Heart disease Paternal Grandmother   . Cancer Paternal Grandfather        lung  . Hypertension Mother     Social History  Substance Use Topics  . Smoking status: Current Every Day Smoker    Packs/day: 0.50    Years: 8.00  . Smokeless tobacco: Never Used  . Alcohol use No    Allergies: No Known Allergies  No prescriptions prior to admission.    Review of Systems  Gastrointestinal: Positive for  abdominal pain.  Genitourinary: Positive for vaginal bleeding. Negative for vaginal discharge.  Musculoskeletal: Positive for back pain.   Physical Exam   Blood pressure 129/72, pulse 94, temperature 98.6 F (37 C), temperature source Oral, resp. rate 18, height 5\' 6"  (1.676 m), weight 183 lb (83 kg), last menstrual period 02/29/2016, unknown if currently breastfeeding.  Physical Exam  Nursing note and vitals reviewed. Constitutional: She is oriented to person, place, and time. She appears well-developed and well-nourished. No distress.  HENT:  Head: Normocephalic and atraumatic.  Cardiovascular: Normal rate.   Respiratory: Effort normal.  GI: Soft. She exhibits no distension and no mass. There is tenderness (mild lower abdominal tenderness to palpation). There is no rebound and no guarding.  Genitourinary: Uterus is enlarged (gravid). Uterus is not tender. Cervix exhibits no motion tenderness, no discharge and no friability. No bleeding in the vagina. Vaginal discharge (small, white) found.  Neurological: She is alert and oriented to person, place, and time.  Skin: Skin is warm and dry. No erythema.  Psychiatric: She has a normal mood and affect.  Dilation: Closed Effacement (%): Thick Cervical Position:  Posterior Exam by:: Vonzella Nipple, PA-C    MAU Course  Procedures None  MDM FHR - 156 bpm with doppler  1 mg Dilaudid IM given for pain - patient reports significant improvement in pain [place] today - preliminary without evidence of previa or abruption. Normal fluid and FHR.  Assessment and Plan  A: [facility] at [redacted]w[redacted]d Fall at home, initial encounter Abdominal trauma in pregnancy, second trimester   P: Discharge home Rx for Flexeril given to patient  Warning signs for worsening condition discussed Patient advised to follow-up with [facility] as scheduled for routine prenatal care on Monday  Patient may return to MAU as needed or if her condition were to change or worsen  Vonzella Nipple, PA-C 07/24/2016, 2:01 AM

## 2016-07-24 ENCOUNTER — Other Ambulatory Visit: Payer: Self-pay | Admitting: Obstetrics

## 2016-07-24 DIAGNOSIS — B373 Candidiasis of vulva and vagina: Secondary | ICD-10-CM

## 2016-07-24 DIAGNOSIS — R109 Unspecified abdominal pain: Secondary | ICD-10-CM

## 2016-07-24 DIAGNOSIS — O9989 Other specified diseases and conditions complicating pregnancy, childbirth and the puerperium: Secondary | ICD-10-CM | POA: Diagnosis not present

## 2016-07-24 DIAGNOSIS — B3731 Acute candidiasis of vulva and vagina: Secondary | ICD-10-CM

## 2016-07-24 DIAGNOSIS — W19XXXA Unspecified fall, initial encounter: Secondary | ICD-10-CM | POA: Diagnosis not present

## 2016-07-24 MED ORDER — FLUCONAZOLE 150 MG PO TABS: 150.0000 mg | ORAL_TABLET | Freq: Once | ORAL | 0 refills | Status: AC

## 2016-07-24 MED ORDER — CYCLOBENZAPRINE HCL 10 MG PO TABS: 10.0000 mg | ORAL_TABLET | Freq: Three times a day (TID) | ORAL | 0 refills | Status: DC | PRN

## 2016-07-24 NOTE — Discharge Instructions (Signed)
Preventing Injuries During Pregnancy Injuries can happen during pregnancy. Minor falls and accidents usually do not harm you or your baby. But some injuries can harm you and your baby. Tell your doctor about any injury you suffer. What can I do to avoid injuries? Safety  Remove rugs and loose objects on the floor.  Wear comfortable shoes that have a good grip. Do not wear shoes that have high heels.  Always wear your seat belt in the car. The lap belt should be below your belly. Always drive safely.  Do not ride on a motorcycle. Activity  Do not take part in rough and violent activities or sports.  Avoid: ? Walking on wet or slippery floors. ? Lifting heavy pots of boiling or hot liquids. ? Fixing electrical problems. ? Being near fires. General instructions  Take over-the-counter and prescription medicines only as told by your doctor.  Know your blood type and the blood type of the baby's father.  If you are a victim of domestic violence: ? Call your local emergency services (911 in the [place] ? Contact the National Domestic Violence Hotline for help and support. Get help right away if:  You fall on your belly or receive any serious blow to your belly.  You have a stiff neck or neck pain after a fall or an injury.  You get a headache or have problems with vision after an injury.  You do not feel the baby move or the baby is not moving as much as normal.  You have been a victim of domestic violence or any other kind of attack.  You have been in a car accident.  You have bleeding from your vagina.  Fluid is leaking from your vagina.  You start to have cramping or pain in your belly (contractions).  You have very bad pain in your lower back.  You feel weak or pass out (faint).  You start to throw up (vomit) after an injury.  You have been burned. Summary  Some injuries that happen during pregnancy can do harm to the baby.  Tell your doctor about any  injury.  Take steps to avoid injury. This includes removing rugs and loose objects on the floor. Always wear your seat belt in the car.  Do not take part in rough and violent activities or sports.  Get help right away if you have any serious accident or injury. This information is not intended to replace advice given to you by your health care provider. Make sure you discuss any questions you have with your health care provider. Document Released: 03/26/2010 Document Revised: 03/02/2016 Document Reviewed: 03/02/2016 Elsevier Interactive Patient Education  2017 [employer]  

## 2016-07-25 ENCOUNTER — Ambulatory Visit (INDEPENDENT_AMBULATORY_CARE_PROVIDER_SITE_OTHER): Payer: Medicaid Other | Admitting: Obstetrics and Gynecology

## 2016-07-25 VITALS — BP 116/76 | HR 96 | Wt 183.9 lb

## 2016-07-25 DIAGNOSIS — O2620 Pregnancy care for patient with recurrent pregnancy loss, unspecified trimester: Secondary | ICD-10-CM

## 2016-07-25 DIAGNOSIS — Z22330 Carrier of Group B streptococcus: Secondary | ICD-10-CM

## 2016-07-25 DIAGNOSIS — Z2839 Other underimmunization status: Secondary | ICD-10-CM

## 2016-07-25 DIAGNOSIS — Z283 Underimmunization status: Secondary | ICD-10-CM

## 2016-07-25 DIAGNOSIS — O9989 Other specified diseases and conditions complicating pregnancy, childbirth and the puerperium: Secondary | ICD-10-CM

## 2016-07-25 DIAGNOSIS — Z8759 Personal history of other complications of pregnancy, childbirth and the puerperium: Secondary | ICD-10-CM

## 2016-07-25 DIAGNOSIS — E559 Vitamin D deficiency, unspecified: Secondary | ICD-10-CM

## 2016-07-25 DIAGNOSIS — Z3480 Encounter for supervision of other normal pregnancy, unspecified trimester: Secondary | ICD-10-CM

## 2016-07-25 LAB — HEMOGLOBINOPATHY EVALUATION
HEMOGLOBIN F QUANTITATION: 0 % (ref 0.0–2.0)
HGB C: 0 %
HGB S: 0 %
HGB VARIANT: 0 %
Hemoglobin A2 Quantitation: 2.6 % (ref 1.8–3.2)
Hgb A: 97.4 % (ref 96.4–98.8)

## 2016-07-25 LAB — OBSTETRIC PANEL, INCLUDING HIV
Antibody Screen: NEGATIVE
BASOS ABS: 0 10*3/uL (ref 0.0–0.2)
Basos: 0 %
EOS (ABSOLUTE): 0.2 10*3/uL (ref 0.0–0.4)
Eos: 1 %
HEMATOCRIT: 35.8 % (ref 34.0–46.6)
HEMOGLOBIN: 12.2 g/dL (ref 11.1–15.9)
HEP B S AG: NEGATIVE
HIV Screen 4th Generation wRfx: NONREACTIVE
IMMATURE GRANULOCYTES: 1 %
Immature Grans (Abs): 0.1 10*3/uL (ref 0.0–0.1)
LYMPHS ABS: 2.1 10*3/uL (ref 0.7–3.1)
Lymphs: 14 %
MCH: 30.7 pg (ref 26.6–33.0)
MCHC: 34.1 g/dL (ref 31.5–35.7)
MCV: 90 fL (ref 79–97)
Monocytes Absolute: 0.7 10*3/uL (ref 0.1–0.9)
Monocytes: 5 %
Neutrophils Absolute: 11.4 10*3/uL — ABNORMAL HIGH (ref 1.4–7.0)
Neutrophils: 79 %
Platelets: 209 10*3/uL (ref 150–379)
RBC: 3.98 x10E6/uL (ref 3.77–5.28)
RDW: 13.7 % (ref 12.3–15.4)
RPR: NONREACTIVE
Rh Factor: POSITIVE
WBC: 14.4 10*3/uL — AB (ref 3.4–10.8)

## 2016-07-25 LAB — VARICELLA ZOSTER ANTIBODY, IGG: VARICELLA: 832 {index} (ref >165)

## 2016-07-25 LAB — VITAMIN D 25 HYDROXY (VIT D DEFICIENCY, FRACTURES): VIT D 25 HYDROXY: 23 ng/mL — AB (ref 30.0–100.0)

## 2016-07-25 MED ORDER — CHOLECALCIFEROL 1.25 MG (50000 UT) PO CAPS: 50000.0000 [IU] | ORAL_CAPSULE | Freq: Every day | ORAL | 2 refills | Status: DC

## 2016-07-25 NOTE — Progress Notes (Signed)
Patient states that she fell over the weekend and went to the hospital on Saturday. Pt reports generalized pain in relation to the fall, denies contractions and bleeding today.

## 2016-07-25 NOTE — Progress Notes (Signed)
Subjective:  Crystal Newman is a 26 y.o. G7P1051 at 2633w0d being seen today for ongoing prenatal care.  She is currently monitored for the following issues for this high-risk pregnancy and has Hidradenitis; GBS carrier; Rubella non-immune status, antepartum; Previous recurrent miscarriages affecting pregnancy, antepartum; Herpes simplex; Encounter for supervision of normal pregnancy, antepartum; History of 5 spontaneous abortions; and Vitamin D deficiency on her problem list.  Patient reports falling over weekend. Seen in MAU normal eval.  Contractions: Not present. Vag. Bleeding: None.  Movement: Present. Denies leaking of fluid.   The following portions of the patient's history were reviewed and updated as appropriate: allergies, current medications, past family history, past medical history, past social history, past surgical history and problem list. Problem list updated.  Objective:   Vitals:   07/25/16 1429  BP: 116/76  Pulse: 96  Weight: 183 lb 14.4 oz (83.4 kg)    Fetal Status:     Movement: Present     General:  Alert, oriented and cooperative. Patient is in no acute distress.  Skin: Skin is warm and dry. No rash noted.   Cardiovascular: Normal heart rate noted  Respiratory: Normal respiratory effort, no problems with respiration noted  Abdomen: Soft, gravid, appropriate for gestational age. Pain/Pressure: Present     Pelvic:  Cervical exam deferred        Extremities: Normal range of motion.  Edema: None  Mental Status: Normal mood and affect. Normal behavior. Normal judgment and thought content.   Urinalysis:      Assessment and Plan:  Pregnancy: [id number] at 6433w0d  1. Supervision of other normal pregnancy, antepartum Stable Sx treatment for musculoskeletal soreness 17-OHP not indicated as no H/O PTD. CL normal on U/S  2. History of 5 spontaneous abortions   3. Rubella non-immune status, antepartum Vaccine post partum  4. GBS carrier Tx while in labor  5.  Previous recurrent miscarriages affecting pregnancy, antepartum   6. Vitamin D deficiency  - Cholecalciferol (D3-50) 50000 units capsule; Take 1 capsule (50,000 Units total) by mouth daily.  Dispense: 30 capsule; Refill: 2  Preterm labor symptoms and general obstetric precautions including but not limited to vaginal bleeding, contractions, leaking of fluid and fetal movement were reviewed in detail with the patient. Please refer to After Visit Summary for other counseling recommendations.  Return in about 4 weeks (around 08/22/2016) for OB visit.   Hermina StaggersErvin, Gaetano Romberger L, MD

## 2016-07-26 ENCOUNTER — Encounter: Payer: Self-pay | Admitting: *Deleted

## 2016-07-26 LAB — AFP, QUAD SCREEN
DIA MOM VALUE: 1.41
DIA VALUE (EIA): 256.66 pg/mL
DSR (By Age)    1 IN: 805
DSR (Second Trimester) 1 IN: 6086
GESTATIONAL AGE AFP: 20.4 wk
MATERNAL AGE AT EDD: 28.7 a
MSAFP Mom: 1.62
MSAFP: 84.5 ng/mL
MSHCG Mom: 0.55
MSHCG: 11359 m[IU]/mL
Osb Risk: 1995
Test Results:: NEGATIVE
UE3 MOM: 0.78
WEIGHT: 182 [lb_av]
uE3 Value: 1.4 ng/mL

## 2016-07-27 LAB — CYSTIC FIBROSIS MUTATION 97: GENE DIS ANAL CARRIER INTERP BLD/T-IMP: NOT DETECTED

## 2016-07-27 LAB — CULTURE, OB URINE

## 2016-07-27 LAB — URINE CULTURE, OB REFLEX

## 2016-08-22 ENCOUNTER — Ambulatory Visit (INDEPENDENT_AMBULATORY_CARE_PROVIDER_SITE_OTHER): Payer: Medicaid Other | Admitting: Obstetrics and Gynecology

## 2016-08-22 VITALS — BP 114/83 | HR 113 | Wt 189.5 lb

## 2016-08-22 DIAGNOSIS — O9989 Other specified diseases and conditions complicating pregnancy, childbirth and the puerperium: Secondary | ICD-10-CM

## 2016-08-22 DIAGNOSIS — O09899 Supervision of other high risk pregnancies, unspecified trimester: Secondary | ICD-10-CM

## 2016-08-22 DIAGNOSIS — Z283 Underimmunization status: Secondary | ICD-10-CM

## 2016-08-22 DIAGNOSIS — Z3492 Encounter for supervision of normal pregnancy, unspecified, second trimester: Secondary | ICD-10-CM

## 2016-08-22 DIAGNOSIS — Z3490 Encounter for supervision of normal pregnancy, unspecified, unspecified trimester: Secondary | ICD-10-CM

## 2016-08-22 MED ORDER — COMFORT FIT MATERNITY SUPP MED MISC: 0 refills | Status: DC

## 2016-08-22 MED ORDER — CYCLOBENZAPRINE HCL 10 MG PO TABS: 10.0000 mg | ORAL_TABLET | Freq: Three times a day (TID) | ORAL | 0 refills | Status: DC | PRN

## 2016-08-22 NOTE — Progress Notes (Signed)
   PRENATAL VISIT NOTE  Subjective:  Crystal Newman is a 26 y.o. G7P1051 at 853w0d being seen today for ongoing prenatal care.  She is currently monitored for the following issues for this low-risk pregnancy and has Hidradenitis; Rubella non-immune status, antepartum; Previous recurrent miscarriages affecting pregnancy, antepartum; Herpes simplex; Encounter for supervision of normal pregnancy, antepartum; History of 5 spontaneous abortions; and Vitamin D deficiency on her problem list.  Patient reports backache.  Contractions: Not present. Vag. Bleeding: None.  Movement: Present. Denies leaking of fluid.   The following portions of the patient's history were reviewed and updated as appropriate: allergies, current medications, past family history, past medical history, past social history, past surgical history and problem list. Problem list updated.  Objective:   Vitals:   08/22/16 1535  BP: 114/83  Pulse: (!) 113  Weight: 189 lb 8 oz (86 kg)    Fetal Status: Fetal Heart Rate (bpm): 154 Fundal Height: 25 cm Movement: Present     General:  Alert, oriented and cooperative. Patient is in no acute distress.  Skin: Skin is warm and dry. No rash noted.   Cardiovascular: Normal heart rate noted  Respiratory: Normal respiratory effort, no problems with respiration noted  Abdomen: Soft, gravid, appropriate for gestational age. Pain/Pressure: Absent     Pelvic:  Cervical exam deferred        Extremities: Normal range of motion.  Edema: Trace  Mental Status: Normal mood and affect. Normal behavior. Normal judgment and thought content.   Assessment and Plan:  Pregnancy: G7P1051 at 263w0d  1. Encounter for supervision of normal pregnancy, antepartum, unspecified gravidity Patient is doing well with worsening back pain s/p fall Refill on flexeril provided along with Rx maternity support belt 2 hr glucola and labs next visit  2. Rubella non-immune status, antepartum Will offer pp  Preterm  labor symptoms and general obstetric precautions including but not limited to vaginal bleeding, contractions, leaking of fluid and fetal movement were reviewed in detail with the patient. Please refer to After Visit Summary for other counseling recommendations.  Return in about 2 weeks (around 09/05/2016) for ROB, 2 hr glucola next visit.   Catalina AntiguaPeggy Diante Barley, MD

## 2016-08-28 DIAGNOSIS — F17200 Nicotine dependence, unspecified, uncomplicated: Secondary | ICD-10-CM | POA: Insufficient documentation

## 2016-09-08 ENCOUNTER — Other Ambulatory Visit: Payer: Medicaid Other

## 2016-09-08 ENCOUNTER — Encounter: Payer: Medicaid Other | Admitting: Obstetrics & Gynecology

## 2016-09-14 LAB — OB RESULTS CONSOLE HEPATITIS B SURFACE ANTIGEN: Hepatitis B Surface Ag: NEGATIVE

## 2016-09-14 LAB — OB RESULTS CONSOLE ANTIBODY SCREEN: Antibody Screen: NEGATIVE

## 2016-09-14 LAB — OB RESULTS CONSOLE ABO/RH: RH Type: POSITIVE

## 2016-09-14 LAB — OB RESULTS CONSOLE HIV ANTIBODY (ROUTINE TESTING): HIV: NONREACTIVE

## 2016-09-14 LAB — OB RESULTS CONSOLE RUBELLA ANTIBODY, IGM: Rubella: NON-IMMUNE/NOT IMMUNE

## 2016-09-14 LAB — OB RESULTS CONSOLE RPR: RPR: NONREACTIVE

## 2016-11-10 ENCOUNTER — Inpatient Hospital Stay (HOSPITAL_COMMUNITY)
Admission: AD | Admit: 2016-11-10 | Discharge: 2016-11-10 | Disposition: A | Discharge status: Home / Self Care | Payer: Medicaid Other | Source: Ambulatory Visit | Attending: Obstetrics and Gynecology | Admitting: Obstetrics and Gynecology

## 2016-11-10 ENCOUNTER — Encounter (HOSPITAL_COMMUNITY): Payer: Self-pay

## 2016-11-10 DIAGNOSIS — O99333 Smoking (tobacco) complicating pregnancy, third trimester: Secondary | ICD-10-CM | POA: Insufficient documentation

## 2016-11-10 DIAGNOSIS — F17210 Nicotine dependence, cigarettes, uncomplicated: Secondary | ICD-10-CM | POA: Insufficient documentation

## 2016-11-10 DIAGNOSIS — Z3A36 36 weeks gestation of pregnancy: Secondary | ICD-10-CM | POA: Insufficient documentation

## 2016-11-10 DIAGNOSIS — O479 False labor, unspecified: Secondary | ICD-10-CM

## 2016-11-10 LAB — URINALYSIS, ROUTINE W REFLEX MICROSCOPIC
Bilirubin Urine: NEGATIVE
Glucose, UA: NEGATIVE mg/dL
Hgb urine dipstick: NEGATIVE
Ketones, ur: 20 mg/dL — AB
Nitrite: NEGATIVE
PH: 5 (ref 5.0–8.0)
Protein, ur: NEGATIVE mg/dL
SPECIFIC GRAVITY, URINE: 1.013 (ref 1.005–1.030)

## 2016-11-10 NOTE — MAU Provider Note (Signed)
FHT now category 1. No labor. OK to discharge to home. RTO in one week. Call for problems.  Marsh DollyA. Vernon Ankur Snowdon, M.D. 11/10/2016

## 2016-11-10 NOTE — MAU Note (Signed)
Pt states ctx started last night and continued today and became more regular. 3-4 minutes apart now. No LOF or bleeding

## 2016-11-10 NOTE — Discharge Instructions (Signed)

## 2016-11-10 NOTE — MAU Provider Note (Signed)
DATE: 11/10/2016  Maternity Admissions Unit History and Physical Exam for an Obstetrics Patient  [name] is a 26 y.o. female, [id number], at 2516w3d gestation, who presents for evaluation of uterine contractions. She denies bleeding and ROM. [facility].  Her pregnancy has been complicated by multiple first trimester pregnancy losses. See history below.  OB History    Gravida Para Term Preterm AB Living   7 1 1   5 1    SAB TAB Ectopic Multiple Live Births   5     0 1      Past Medical History:  Diagnosis Date  . Anxiety   . Asthma    sports induced  . Infection    UTI-frequent  . Seizures (HCC)    unknown cause, last at age 26  . Short PR-normal QRS complex syndrome   . Syncope    neurally-mediated    Prescriptions Prior to Admission  Medication Sig Dispense Refill Last Dose  . acetaminophen (TYLENOL) 500 MG tablet Take 1,000 mg by mouth every 6 (six) hours as needed for moderate pain or headache.   11/09/2016 at Unknown time  . cyclobenzaprine (FLEXERIL) 10 MG tablet Take 1 tablet (10 mg total) by mouth 3 (three) times daily as needed for muscle spasms. 30 tablet 0 11/09/2016 at Unknown time  . flintstones complete (FLINTSTONES) 60 MG chewable tablet Chew 2 tablets by mouth daily.   11/09/2016 at Unknown time  . valACYclovir (VALTREX) 500 MG tablet Take 500 mg by mouth daily.   11/10/2016 at Unknown time  . Cholecalciferol (D3-50) 50000 units capsule Take 1 capsule (50,000 Units total) by mouth daily. (Patient not taking: Reported on 11/10/2016) 30 capsule 2 Not Taking at Unknown time  . Elastic Bandages & Supports (COMFORT FIT MATERNITY SUPP MED) MISC Wear daily when ambulating 1 each 0     Past Surgical History:  Procedure Laterality Date  . TONSILLECTOMY    . WISDOM TOOTH EXTRACTION      No Known Allergies  Family History: family history includes Cancer in her maternal  aunt and paternal grandfather; Diabetes in her maternal grandmother and paternal grandmother; Heart disease in her maternal grandfather, maternal grandmother, and paternal grandmother; Hypertension in her father, maternal grandmother, and mother.  Social History:  reports that she has been smoking.  She has a 4.00 pack-year smoking history. She has never used smokeless tobacco. She reports that she does not drink alcohol or use drugs.  Review of systems: Normal pregnancy complaints.  Admission Physical Exam:  Dilation: 1.5 Effacement (%): Thick Station: -2 Exam by:: Ginnie Smartachel Schmidt RN, Sharrie RothmanErin Howell RN There is no height or weight on file to calculate BMI.  Blood pressure 129/79, pulse 100, temperature 97.9 F (36.6 C), temperature source Oral, resp. rate 16, last menstrual period 02/29/2016, SpO2 99 %, unknown if currently breastfeeding.  HEENT:                 Within normal limits Chest:                   Clear Heart:                    Regular rate and rhythm Abdomen:             Gravid and nontender  Prenatal labs: ABO, Rh:  O/Positive/-- (05/17 1610) Antibody:              Negative (05/17 1610)           RPR:                    Non Reactive (05/17 1610)  HBsAg:                 Negative (05/17 1610)  NST: Category 2; Contractions: irregular, mild .   Assessment:  [redacted]w[redacted]d gestation  Irregular contractions (no labor)  Category 2 tracing  Plan:  Regular diet (the patient has not eaten since breakfast)  Continue to observe for now. I will IV hydrate if needed.   Joshia Kitchings V 11/10/2016, 5:00 PM

## 2016-11-15 ENCOUNTER — Encounter (HOSPITAL_COMMUNITY): Payer: Self-pay | Admitting: *Deleted

## 2016-11-15 ENCOUNTER — Inpatient Hospital Stay (HOSPITAL_COMMUNITY)
Admission: AD | Admit: 2016-11-15 | Discharge: 2016-11-15 | Disposition: A | Discharge status: Home / Self Care | Payer: 59 | Source: Ambulatory Visit | Attending: Obstetrics and Gynecology | Admitting: Obstetrics and Gynecology

## 2016-11-15 DIAGNOSIS — Z3A37 37 weeks gestation of pregnancy: Secondary | ICD-10-CM | POA: Diagnosis not present

## 2016-11-15 DIAGNOSIS — Z3493 Encounter for supervision of normal pregnancy, unspecified, third trimester: Secondary | ICD-10-CM | POA: Diagnosis present

## 2016-11-15 DIAGNOSIS — O479 False labor, unspecified: Secondary | ICD-10-CM

## 2016-11-15 HISTORY — DX: Headache, unspecified: R51.9

## 2016-11-15 HISTORY — DX: Headache: R51

## 2016-11-15 NOTE — MAU Note (Signed)
Contractions started at 0230. Now every 2-3 min. No bleeding or leaking.  Was 1+ when last checked.

## 2016-11-15 NOTE — Discharge Instructions (Signed)

## 2016-11-15 NOTE — MAU Provider Note (Signed)
History   26 uyo [id number] at 37 1/7 weeks presented unannounced c/o UCs since 0230.  Seen in MAU 9/6 for labor check, cervix 2 cm per patient report.  Denies leaking or bleeding, reports +FM.  Denies HA, visual sx, or epigastric pain.  Patient lives in [place], 30 min away.  Has scheduled ROB appt at [facility] at 1:30p.  Patient Active Problem List   Diagnosis Date Noted  . Vitamin D deficiency 07/25/2016  . History of 5 spontaneous abortions 07/22/2016  . Encounter for supervision of normal pregnancy, antepartum 07/21/2016  . 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 Rubella non-immune status, antepartum 11/18/2014  . 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 Hidradenitis 07/21/2011    Chief Complaint  Patient presents with  . Contractions   HPI:  As above  OB History    Gravida Para Term Preterm AB Living   SAB TAB Ectopic Multiple Live Births   5     0 1      Past Medical History:  Diagnosis Date  . Anxiety   . Asthma    sports induced  . Headache   . Infection    UTI-frequent  . Seizures (HCC)    unknown cause, last at age 29  . Short PR-normal QRS complex syndrome   . Syncope    neurally-mediated    Past Surgical History:  Procedure Laterality Date  . TONSILLECTOMY    . WISDOM TOOTH EXTRACTION      Family History  Problem Relation Age of Onset  . Hypertension Father   . Cancer Maternal Aunt        ovarian  . Diabetes Maternal Grandmother   . Heart disease Maternal Grandmother   . Hypertension Maternal Grandmother   . Heart disease Maternal Grandfather   . Diabetes Paternal Grandmother   . Heart disease Paternal Grandmother   . Cancer Paternal Grandfather        lung  . Hypertension Mother   . Heart disease Mother        2 heart attack    Social History  Substance Use Topics  . Smoking status: Current Every Day Smoker    Packs/day: 0.50    Years: 8.00  . Smokeless tobacco: Never  Used  . Alcohol use No    Allergies: No Known Allergies  Prescriptions Prior to Admission  Medication Sig Dispense Refill Last Dose  . acetaminophen (TYLENOL) 500 MG tablet Take 1,000 mg by mouth every 6 (six) hours as needed for moderate pain or headache.   Past Week at Unknown time  . cyclobenzaprine (FLEXERIL) 10 MG tablet Take 1 tablet (10 mg total) by mouth 3 (three) times daily as needed for muscle spasms. 30 tablet 0 Past Week at Unknown time  . flintstones complete (FLINTSTONES) 60 MG chewable tablet Chew 2 tablets by mouth daily.   11/14/2016 at Unknown time  . valACYclovir (VALTREX) 500 MG tablet Take 500 mg by mouth daily.   11/14/2016 at Unknown time  . Cholecalciferol (D3-50) 50000 units capsule Take 1 capsule (50,000 Units total) by mouth daily. (Patient not taking: Reported on 11/10/2016) 30 capsule 2 Not Taking at Unknown time  . Elastic Bandages & Supports (COMFORT FIT MATERNITY SUPP MED) MISC Wear daily when ambulating 1 each 0     ROS :  Cramping, positive FM Physical Exam   Blood pressure 113/81, pulse 99, temperature 98.1 F (36.7 C),  temperature source Oral, resp. rate 18, weight 91.6 kg (202 lb), last menstrual period 02/29/2016, unknown if currently breastfeeding.   Vitals:   11/15/16 0827 11/15/16 1006  BP: 115/85 113/81  Pulse: (!) 121 99  Resp: 18   Temp: 98.1 F (36.7 C)   TempSrc: Oral   Weight: 91.6 kg (202 lb)        Physical Exam  In NAD Chest clear Heart RRR without murmur Abd gravid, NT Pelvic--cervix posterior, 2 cm, 70%, vtx, -2.  Unchanged after 1 1/2 hours ambulating. Ext WNL  FHR Category 1 UCs initially q 4-6 min, now irregular, milder  ED Course  Assessment: IUP at 37 1/7 weeks Prodromal labor GBS positive  Plan: Keep scheduled appt at [facility] at 1:30 for recheck of status.  Return to MAU prn, if labor advances.   Nigel BridgemanLATHAM, Foch Rosenwald CNM, MSN 11/15/2016 10:44 AM

## 2016-11-17 ENCOUNTER — Inpatient Hospital Stay (HOSPITAL_COMMUNITY)
Admission: AD | Admit: 2016-11-17 | Discharge: 2016-11-17 | Disposition: A | Discharge status: Home / Self Care | Payer: 59 | Source: Ambulatory Visit | Attending: Obstetrics and Gynecology | Admitting: Obstetrics and Gynecology

## 2016-11-17 ENCOUNTER — Encounter (HOSPITAL_COMMUNITY): Payer: Self-pay

## 2016-11-17 DIAGNOSIS — O99513 Diseases of the respiratory system complicating pregnancy, third trimester: Secondary | ICD-10-CM | POA: Insufficient documentation

## 2016-11-17 DIAGNOSIS — O26893 Other specified pregnancy related conditions, third trimester: Secondary | ICD-10-CM | POA: Diagnosis not present

## 2016-11-17 DIAGNOSIS — R51 Headache: Secondary | ICD-10-CM | POA: Diagnosis present

## 2016-11-17 DIAGNOSIS — R519 Headache, unspecified: Secondary | ICD-10-CM

## 2016-11-17 DIAGNOSIS — O99343 Other mental disorders complicating pregnancy, third trimester: Secondary | ICD-10-CM | POA: Insufficient documentation

## 2016-11-17 DIAGNOSIS — O163 Unspecified maternal hypertension, third trimester: Secondary | ICD-10-CM | POA: Diagnosis not present

## 2016-11-17 DIAGNOSIS — Z79899 Other long term (current) drug therapy: Secondary | ICD-10-CM | POA: Insufficient documentation

## 2016-11-17 DIAGNOSIS — J45909 Unspecified asthma, uncomplicated: Secondary | ICD-10-CM | POA: Insufficient documentation

## 2016-11-17 DIAGNOSIS — F419 Anxiety disorder, unspecified: Secondary | ICD-10-CM | POA: Insufficient documentation

## 2016-11-17 DIAGNOSIS — F17210 Nicotine dependence, cigarettes, uncomplicated: Secondary | ICD-10-CM | POA: Insufficient documentation

## 2016-11-17 DIAGNOSIS — Z3A37 37 weeks gestation of pregnancy: Secondary | ICD-10-CM | POA: Insufficient documentation

## 2016-11-17 DIAGNOSIS — O99333 Smoking (tobacco) complicating pregnancy, third trimester: Secondary | ICD-10-CM | POA: Diagnosis not present

## 2016-11-17 LAB — CBC
HEMATOCRIT: 33.2 % — AB (ref 36.0–46.0)
HEMOGLOBIN: 10.9 g/dL — AB (ref 12.0–15.0)
MCH: 29.1 pg (ref 26.0–34.0)
MCHC: 32.8 g/dL (ref 30.0–36.0)
MCV: 88.5 fL (ref 78.0–100.0)
Platelets: 211 10*3/uL (ref 150–400)
RBC: 3.75 MIL/uL — ABNORMAL LOW (ref 3.87–5.11)
RDW: 13.6 % (ref 11.5–15.5)
WBC: 12.1 10*3/uL — ABNORMAL HIGH (ref 4.0–10.5)

## 2016-11-17 LAB — COMPREHENSIVE METABOLIC PANEL
ALK PHOS: 197 U/L — AB (ref 38–126)
ALT: 11 U/L — ABNORMAL LOW (ref 14–54)
ANION GAP: 8 (ref 5–15)
AST: 17 U/L (ref 15–41)
Albumin: 2.5 g/dL — ABNORMAL LOW (ref 3.5–5.0)
BILIRUBIN TOTAL: 0.9 mg/dL (ref 0.3–1.2)
BUN: 8 mg/dL (ref 6–20)
CALCIUM: 8.2 mg/dL — AB (ref 8.9–10.3)
CO2: 24 mmol/L (ref 22–32)
Chloride: 103 mmol/L (ref 101–111)
Creatinine, Ser: 0.54 mg/dL (ref 0.44–1.00)
GFR calc Af Amer: >60 mL/min (ref >60)
Glucose, Bld: 80 mg/dL (ref 65–99)
Potassium: 3.6 mmol/L (ref 3.5–5.1)
Sodium: 135 mmol/L (ref 135–145)
TOTAL PROTEIN: 6.2 g/dL — AB (ref 6.5–8.1)

## 2016-11-17 LAB — URINALYSIS, ROUTINE W REFLEX MICROSCOPIC
Bilirubin Urine: NEGATIVE
GLUCOSE, UA: NEGATIVE mg/dL
HGB URINE DIPSTICK: NEGATIVE
Ketones, ur: NEGATIVE mg/dL
NITRITE: NEGATIVE
PH: 5 (ref 5.0–8.0)
Protein, ur: 30 mg/dL — AB
SPECIFIC GRAVITY, URINE: 1.028 (ref 1.005–1.030)

## 2016-11-17 LAB — PROTEIN / CREATININE RATIO, URINE
CREATININE, URINE: 351 mg/dL
Protein Creatinine Ratio: 0.06 mg/mg{Cre} (ref 0.00–0.15)
Total Protein, Urine: 21 mg/dL

## 2016-11-17 MED ORDER — ACETAMINOPHEN 500 MG PO TABS: 1000.0000 mg | ORAL_TABLET | Freq: Once | ORAL | Status: DC

## 2016-11-17 MED ORDER — ACETAMINOPHEN 500 MG PO TABS
1000.0000 mg | ORAL_TABLET | Freq: Once | ORAL | Status: AC
  Administered 2016-11-17: 1000 mg via ORAL
  Filled 2016-11-17: qty 2

## 2016-11-17 NOTE — MAU Note (Signed)
Pt reports she awakened with a headache today with blurred vision. B/p at home elevated.

## 2016-11-17 NOTE — MAU Provider Note (Signed)
History     CSN: [id number]  Arrival date and time: 11/17/16 0940   First Provider Initiated Contact with Patient 11/17/16 1208      Chief Complaint  Patient presents with  . Headache  . Hypertension  . Contractions   HPI Pt comes in c/o blurred vision and headache.  Good FM.  No RUQ pain    Past Medical History:  Diagnosis Date  . Anxiety   . Asthma    sports induced  . Headache   . Infection    UTI-frequent  . Seizures ([place])    unknown cause, last at age 26  . Short PR-normal QRS complex syndrome   . Syncope    neurally-mediated    Past Surgical History:  Procedure Laterality Date  . TONSILLECTOMY    . WISDOM TOOTH EXTRACTION      Family History  Problem Relation Age of Onset  . Hypertension Father   . Cancer Maternal Aunt        ovarian  . Diabetes Maternal Grandmother   . Heart disease Maternal Grandmother   . Hypertension Maternal Grandmother   . Heart disease Maternal Grandfather   . Diabetes Paternal Grandmother   . Heart disease Paternal Grandmother   . Cancer Paternal Grandfather        lung  . Hypertension Mother   . Heart disease Mother        2 heart attack    Social History  Substance Use Topics  . Smoking status: Current Every Day Smoker    Packs/day: 0.50    Years: 8.00  . Smokeless tobacco: Never Used  . Alcohol use No    Allergies: No Known Allergies  Prescriptions Prior to Admission  Medication Sig Dispense Refill Last Dose  . acetaminophen (TYLENOL) 500 MG tablet Take 1,000 mg by mouth every 6 (six) hours as needed for moderate pain or headache.   11/16/2016 at Unknown time  . cyclobenzaprine (FLEXERIL) 10 MG tablet Take 1 tablet (10 mg total) by mouth 3 (three) times daily as needed for muscle spasms. 30 tablet 0 Past Week at Unknown time  . flintstones complete (FLINTSTONES) 60 MG chewable tablet Chew 2 tablets by mouth daily.   11/16/2016 at Unknown time  . valACYclovir (VALTREX) 500 MG tablet Take 500 mg by mouth daily.    11/16/2016 at Unknown time  . Cholecalciferol (D3-50) 50000 units capsule Take 1 capsule (50,000 Units total) by mouth daily. (Patient not taking: Reported on 11/10/2016) 30 capsule 2 Not Taking at Unknown time  . Elastic Bandages & Supports (COMFORT FIT MATERNITY SUPP MED) MISC Wear daily when ambulating 1 each 0     Review of Systems Physical Exam   Blood pressure 97/80, pulse 75, temperature 98 F (36.7 C), temperature source Oral, resp. rate 15, height _0  (1.676 m), weight 92.1 kg (203 lb), last menstrual period 02/29/2016, SpO2 100 %, unknown if currently breastfeeding.  Physical Exam  Physical Examination: General appearance - alert, well appearing, and in no distress Mental status - alert, oriented to person, place, and time Chest - clear to auscultation, no wheezes, rales or rhonchi, symmetric air entry Heart - normal rate and regular rhythm Abdomen - soft, nontender, nondistended, no masses or organomegaly gravid Pelvic - 2-3/50/-1 Extremities - Homan's sign negative bilaterally, DTR 2 plus B  MAU Course  Procedures  MDM Recent Results (from the past 2160 hour(s))  Urinalysis, Routine w reflex microscopic     Status: Abnormal   Collection Time:  11/10/16  3:30 PM  Result Value Ref Range   Color, Urine YELLOW YELLOW   APPearance HAZY (A) CLEAR   Specific Gravity, Urine 1.013 1.005 - 1.030   pH 5.0 5.0 - 8.0   Glucose, UA NEGATIVE NEGATIVE mg/dL   Hgb urine dipstick NEGATIVE NEGATIVE   Bilirubin Urine NEGATIVE NEGATIVE   Ketones, ur 20 (A) NEGATIVE mg/dL   Protein, ur NEGATIVE NEGATIVE mg/dL   Nitrite NEGATIVE NEGATIVE   Leukocytes, UA LARGE (A) NEGATIVE   RBC / HPF 0-5 0 - 5 RBC/hpf   WBC, UA 6-30 0 - 5 WBC/hpf   Bacteria, UA RARE (A) NONE SEEN   Squamous Epithelial / LPF 6-30 (A) NONE SEEN   Mucus PRESENT   Urinalysis, Routine w reflex microscopic     Status: Abnormal   Collection Time: 11/17/16  9:56 AM  Result Value Ref Range   Color, Urine AMBER (A) YELLOW     Comment: BIOCHEMICALS MAY BE AFFECTED BY COLOR   APPearance HAZY (A) CLEAR   Specific Gravity, Urine 1.028 1.005 - 1.030   pH 5.0 5.0 - 8.0   Glucose, UA NEGATIVE NEGATIVE mg/dL   Hgb urine dipstick NEGATIVE NEGATIVE   Bilirubin Urine NEGATIVE NEGATIVE   Ketones, ur NEGATIVE NEGATIVE mg/dL   Protein, ur 30 (A) NEGATIVE mg/dL   Nitrite NEGATIVE NEGATIVE   Leukocytes, UA LARGE (A) NEGATIVE   RBC / HPF 0-5 0 - 5 RBC/hpf   WBC, UA 6-30 0 - 5 WBC/hpf   Bacteria, UA RARE (A) NONE SEEN   Squamous Epithelial / LPF 0-5 (A) NONE SEEN   Mucus PRESENT   Protein / creatinine ratio, urine     Status: None   Collection Time: 11/17/16  9:56 AM  Result Value Ref Range   Creatinine, Urine 351.00 mg/dL   Total Protein, Urine 21 mg/dL    Comment: NO NORMAL RANGE ESTABLISHED FOR THIS TEST   Protein Creatinine Ratio 0.06 0.00 - 0.15 mg/mg[Cre]  CBC     Status: Abnormal   Collection Time: 11/17/16 10:57 AM  Result Value Ref Range   WBC 12.1 (H) 4.0 - 10.5 K/uL   RBC 3.75 (L) 3.87 - 5.11 MIL/uL   Hemoglobin 10.9 (L) 12.0 - 15.0 g/dL   HCT 33.2 (L) 36.0 - 46.0 %   MCV 88.5 78.0 - 100.0 fL   MCH 29.1 26.0 - 34.0 pg   MCHC 32.8 30.0 - 36.0 g/dL   RDW 13.6 11.5 - 15.5 %   Platelets 211 150 - 400 K/uL  Comprehensive metabolic panel     Status: Abnormal   Collection Time: 11/17/16 10:57 AM  Result Value Ref Range   Sodium 135 135 - 145 mmol/L   Potassium 3.6 3.5 - 5.1 mmol/L   Chloride 103 101 - 111 mmol/L   CO2 24 22 - 32 mmol/L   Glucose, Bld 80 65 - 99 mg/dL   BUN 8 6 - 20 mg/dL   Creatinine, Ser 0.54 0.44 - 1.00 mg/dL   Calcium 8.2 (L) 8.9 - 10.3 mg/dL   Total Protein 6.2 (L) 6.5 - 8.1 g/dL   Albumin 2.5 (L) 3.5 - 5.0 g/dL   AST 17 15 - 41 U/L   ALT 11 (L) 14 - 54 U/L   Alkaline Phosphatase 197 (H) 38 - 126 U/L   Total Bilirubin 0.9 0.3 - 1.2 mg/dL   GFR calc non Af Amer >60 >60 mL/min   GFR calc Af Amer >60 >60 mL/min  Comment: (NOTE) The eGFR has been calculated using the CKD EPI  equation. This calculation has not been validated in all clinical situations. eGFR's persistently <60 mL/min signify possible Chronic Kidney Disease.    Anion gap 8 5 - 15    Assessment and Plan  Headache at 37 weeks NST reactive Tylenol for headache PIH labs WNL Pt with one elevated BP Will DC home with labor and PIH precautions  [place] A 11/17/2016, 12:15 PM

## 2016-11-21 DIAGNOSIS — R809 Proteinuria, unspecified: Secondary | ICD-10-CM | POA: Insufficient documentation

## 2016-11-22 ENCOUNTER — Encounter (HOSPITAL_COMMUNITY): Payer: Self-pay | Admitting: *Deleted

## 2016-11-22 ENCOUNTER — Inpatient Hospital Stay (HOSPITAL_COMMUNITY)
Admission: AD | Admit: 2016-11-22 | Discharge: 2016-11-22 | Disposition: A | Discharge status: Home / Self Care | Payer: 59 | Source: Ambulatory Visit | Attending: Obstetrics and Gynecology | Admitting: Obstetrics and Gynecology

## 2016-11-22 DIAGNOSIS — O99333 Smoking (tobacco) complicating pregnancy, third trimester: Secondary | ICD-10-CM | POA: Diagnosis not present

## 2016-11-22 DIAGNOSIS — Z3A38 38 weeks gestation of pregnancy: Secondary | ICD-10-CM | POA: Insufficient documentation

## 2016-11-22 DIAGNOSIS — F17210 Nicotine dependence, cigarettes, uncomplicated: Secondary | ICD-10-CM | POA: Insufficient documentation

## 2016-11-22 DIAGNOSIS — Z3493 Encounter for supervision of normal pregnancy, unspecified, third trimester: Secondary | ICD-10-CM

## 2016-11-22 DIAGNOSIS — Z8249 Family history of ischemic heart disease and other diseases of the circulatory system: Secondary | ICD-10-CM | POA: Diagnosis not present

## 2016-11-22 DIAGNOSIS — R030 Elevated blood-pressure reading, without diagnosis of hypertension: Secondary | ICD-10-CM | POA: Diagnosis present

## 2016-11-22 DIAGNOSIS — Z87440 Personal history of urinary (tract) infections: Secondary | ICD-10-CM | POA: Insufficient documentation

## 2016-11-22 DIAGNOSIS — O26893 Other specified pregnancy related conditions, third trimester: Secondary | ICD-10-CM | POA: Insufficient documentation

## 2016-11-22 HISTORY — DX: Hidradenitis suppurativa: L73.2

## 2016-11-22 LAB — URINALYSIS, ROUTINE W REFLEX MICROSCOPIC
Bilirubin Urine: NEGATIVE
GLUCOSE, UA: NEGATIVE mg/dL
Hgb urine dipstick: NEGATIVE
Ketones, ur: NEGATIVE mg/dL
Nitrite: NEGATIVE
PH: 5 (ref 5.0–8.0)
Protein, ur: NEGATIVE mg/dL
SPECIFIC GRAVITY, URINE: 1.014 (ref 1.005–1.030)

## 2016-11-22 LAB — PROTEIN / CREATININE RATIO, URINE
Creatinine, Urine: 124 mg/dL
PROTEIN CREATININE RATIO: 0.15 mg/mg{creat} (ref 0.00–0.15)
TOTAL PROTEIN, URINE: 19 mg/dL

## 2016-11-22 MED ORDER — ACETAMINOPHEN 500 MG PO TABS: 1000.0000 mg | ORAL_TABLET | Freq: Once | ORAL | Status: DC

## 2016-11-22 NOTE — MAU Note (Signed)
PT SAYS  WAS IN OFFICE  TODAY-   SINCE  THEN  FEET AND LEGS HAVE SWOLLEN.    AT BP WAS 157/126.    H/A  STARTED ,  VOMITED IN PARKING LOT ,   SAYS BLURRED VISION "SOMEWHAT " ,    NO EPIGASTRIC  PAIN.Marland Kitchen    FEELS BABY MOVING. .  VE IN OFFICE - 3 CM .   HAS HX OF HSV- LAST OUTBREAK- NONE  DURING THIS PREG-  TAKES VALTREX.   GBS- POSITIVE.

## 2016-11-22 NOTE — MAU Provider Note (Signed)
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  Patient reports she has had 2-3 elevated bp at home with the last one 150s/120s.  Patient reports "nagging" headache, but no visual disturbances, epigastric pain, or SOB. Patient does c/o swelling in legs.  Patient questions when she can be induced.   Patient Active Problem List   Diagnosis Date Noted  . Vitamin D deficiency 07/25/2016  . History of 5 spontaneous abortions 07/22/2016  . 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 Rubella non-immune status, antepartum 11/18/2014  . 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 Hidradenitis 07/21/2011    Chief Complaint  Patient presents with  . Hypertension   HPI  OB History    Gravida Para Term Preterm AB Living   SAB TAB Ectopic Multiple Live Births   5     0 1      Past Medical History:  Diagnosis Date  . Anxiety   . Asthma    sports induced  . Headache   . Hidradenitis   . Infection    UTI-frequent  . Seizures (HCC)    unknown cause, last at age 34  . Short PR-normal QRS complex syndrome   . Syncope    neurally-mediated    Past Surgical History:  Procedure Laterality Date  . TONSILLECTOMY    . WISDOM TOOTH EXTRACTION      Family History  Problem Relation Age of Onset  . Hypertension Father   . Cancer Maternal Aunt        ovarian  . Diabetes Maternal Grandmother   . Heart disease Maternal Grandmother   . Hypertension Maternal Grandmother   . Heart disease Maternal Grandfather   . Diabetes Paternal Grandmother   . Heart disease Paternal Grandmother   . Cancer Paternal Grandfather        lung  . Hypertension Mother   . Heart disease Mother        2 heart attack    Social History  Substance Use Topics  . Smoking status: Current Every Day Smoker    Packs/day: 0.50    Years: 8.00  . Smokeless tobacco: Never Used  . Alcohol use No    Allergies:  No Known Allergies  Prescriptions Prior to Admission  Medication Sig Dispense Refill Last Dose  . acetaminophen (TYLENOL) 500 MG tablet Take 1,000 mg by mouth every 6 (six) hours as needed for moderate pain or headache.   11/21/2016 at Unknown time  . cyclobenzaprine (FLEXERIL) 10 MG tablet Take 1 tablet (10 mg total) by mouth 3 (three) times daily as needed for muscle spasms. 30 tablet 0 11/21/2016 at Unknown time  . flintstones complete (FLINTSTONES) 60 MG chewable tablet Chew 2 tablets by mouth daily.   11/21/2016 at Unknown time  . valACYclovir (VALTREX) 500 MG tablet Take 500 mg by mouth daily.   11/21/2016 at Unknown time  . Cholecalciferol (D3-50) 50000 units capsule Take 1 capsule (50,000 Units total) by mouth daily. (Patient not taking: Reported on 11/10/2016) 30 capsule 2 More than a month at Unknown time  . Elastic Bandages & Supports (COMFORT FIT MATERNITY SUPP MED) MISC Wear daily when ambulating 1 each 0     ROS  See HPI Above Physical Exam   Blood pressure 114/85, pulse 88, temperature 98.5 F (36.9 C), temperature source Oral, resp. rate 20, height 5'  6" (1.676 m), weight 95 kg (209 lb 8 oz), last menstrual period 02/29/2016, unknown if currently breastfeeding.  Results for orders placed or performed during the hospital encounter of 11/22/16 (from the past 24 hour(s))  Urinalysis, Routine w reflex microscopic     Status: Abnormal   Collection Time: 11/22/16  2:20 AM  Result Value Ref Range   Color, Urine YELLOW YELLOW   APPearance CLEAR CLEAR   Specific Gravity, Urine 1.014 1.005 - 1.030   pH 5.0 5.0 - 8.0   Glucose, UA NEGATIVE NEGATIVE mg/dL   Hgb urine dipstick NEGATIVE NEGATIVE   Bilirubin Urine NEGATIVE NEGATIVE   Ketones, ur NEGATIVE NEGATIVE mg/dL   Protein, ur NEGATIVE NEGATIVE mg/dL   Nitrite NEGATIVE NEGATIVE   Leukocytes, UA SMALL (A) NEGATIVE   RBC / HPF 0-5 0 - 5 RBC/hpf   WBC, UA 0-5 0 - 5 WBC/hpf   Bacteria, UA RARE (A) NONE SEEN   Squamous Epithelial /  LPF 0-5 (A) NONE SEEN   Mucus PRESENT   Protein / creatinine ratio, urine     Status: None   Collection Time: 11/22/16  2:20 AM  Result Value Ref Range   Creatinine, Urine 124.00 mg/dL   Total Protein, Urine 19 mg/dL   Protein Creatinine Ratio 0.15 0.00 - 0.15 mg/mg[Cre]    Physical Exam  Constitutional: She is oriented to person, place, and time. She appears well-developed and well-nourished. No distress.  HENT:  Head: Normocephalic and atraumatic.  Eyes: Conjunctivae are normal.  Neck: Normal range of motion.  Cardiovascular: Normal rate, regular rhythm and normal heart sounds.   Respiratory: Effort normal and breath sounds normal.  GI: Soft. Bowel sounds are normal.  Musculoskeletal: Normal range of motion. She exhibits edema.  Neurological: She is alert and oriented to person, place, and time.  Skin: Skin is warm and dry.  Psychiatric: She has a normal mood and affect. Her behavior is normal.     FHR:135 bpm, Mod Var, -Decels, +Accels UC: No graphed ED Course  Assessment: IUP at 38.1wks Cat I FT Elevated BP at Home  Plan: -UA negative -PC Ratio at .15 -BP normal range -Will not perform other labs -Offered and declined tylenol -Instructed to take bp machine into office to "calibrate" with office measurements -Discussed medical vs elective induction -Discussed membrane stripping and sex to promote contractions -Encouraged to call if any questions or concerns arise prior to next scheduled office visit.  -Keep appt as scheduled: 9/27 -Discharged to home in stable condition  Cherre Robins CNM, MSN 11/22/2016 3:13 AM

## 2016-11-22 NOTE — Discharge Instructions (Signed)
Hypertension During Pregnancy °Hypertension, commonly called high blood pressure, is when the force of blood pumping through your arteries is too strong. Arteries are blood vessels that carry blood from the heart throughout the body. Hypertension during pregnancy can cause problems for you and your baby. Your baby may be born early (prematurely) or may not weigh as much as he or she should at birth. Very bad cases of hypertension during pregnancy can be life-threatening. °Different types of hypertension can occur during pregnancy. These include: °· Chronic hypertension. This happens when: °? You have hypertension before pregnancy and it continues during pregnancy. °? You develop hypertension before you are [redacted] weeks pregnant, and it continues during pregnancy. °· Gestational hypertension. This is hypertension that develops after the 20th week of pregnancy. °· Preeclampsia, also called toxemia of pregnancy. This is a very serious type of hypertension that develops only during pregnancy. It affects the whole body, and it can be very dangerous for you and your baby. ° °Gestational hypertension and preeclampsia usually go away within 6 weeks after your baby is born. Women who have hypertension during pregnancy have a greater chance of developing hypertension later in life or during future pregnancies. °What are the causes? °The exact cause of hypertension is not known. °What increases the risk? °There are certain factors that make it more likely for you to develop hypertension during pregnancy. These include: °· Having hypertension during a previous pregnancy or prior to pregnancy. °· Being overweight. °· Being older than age 40. °· Being pregnant for the first time or being pregnant with more than one baby. °· Becoming pregnant using fertilization methods such as IVF (in vitro fertilization). °· Having diabetes, kidney problems, or systemic lupus erythematosus. °· Having a family history of hypertension. ° °What are the  signs or symptoms? °Chronic hypertension and gestational hypertension rarely cause symptoms. Preeclampsia causes symptoms, which may include: °· Increased protein in your urine. Your health care provider will check for this at every visit before you give birth (prenatal visit). °· Severe headaches. °· Sudden weight gain. °· Swelling of the hands, face, legs, and feet. °· Nausea and vomiting. °· Vision problems, such as blurred or double vision. °· Numbness in the face, arms, legs, and feet. °· Dizziness. °· Slurred speech. °· Sensitivity to bright lights. °· Abdominal pain. °· Convulsions. ° °How is this diagnosed? °You may be diagnosed with hypertension during a routine prenatal exam. At each prenatal visit, you may: °· Have a urine test to check for high amounts of protein in your urine. °· Have your blood pressure checked. A blood pressure reading is recorded as two numbers, such as "120 over 80" (or 120/80). The first ("top") number is called the systolic pressure. It is a measure of the pressure in your arteries when your heart beats. The second ("bottom") number is called the diastolic pressure. It is a measure of the pressure in your arteries as your heart relaxes between beats. Blood pressure is measured in a unit called mm Hg. A normal blood pressure reading is: °? Systolic: below 120. °? Diastolic: below 80. ° °The type of hypertension that you are diagnosed with depends on your test results and when your symptoms developed. °· Chronic hypertension is usually diagnosed before 20 weeks of pregnancy. °· Gestational hypertension is usually diagnosed after 20 weeks of pregnancy. °· Hypertension with high amounts of protein in the urine is diagnosed as preeclampsia. °· Blood pressure measurements that stay above 160 systolic, or above 110 diastolic, are   signs of severe preeclampsia. ° °How is this treated? °Treatment for hypertension during pregnancy varies depending on the type of hypertension you have and how  serious it is. °· If you take medicines called ACE inhibitors to treat chronic hypertension, you may need to switch medicines. ACE inhibitors should not be taken during pregnancy. °· If you have gestational hypertension, you may need to take blood pressure medicine. °· If you are at risk for preeclampsia, your health care provider may recommend that you take a low-dose aspirin every day to prevent high blood pressure during your pregnancy. °· If you have severe preeclampsia, you may need to be hospitalized so you and your baby can be monitored closely. You may also need to take medicine (magnesium sulfate) to prevent seizures and to lower blood pressure. This medicine may be given as an injection or through an IV tube. °· In some cases, if your condition gets worse, you may need to deliver your baby early. ° °Follow these instructions at home: °Eating and drinking °· Drink enough fluid to keep your urine clear or pale yellow. °· Eat a healthy diet that is low in salt (sodium). Do not add salt to your food. Check food labels to see how much sodium a food or beverage contains. °Lifestyle °· Do not use any products that contain nicotine or tobacco, such as cigarettes and e-cigarettes. If you need help quitting, ask your health care provider. °· Do not use alcohol. °· Avoid caffeine. °· Avoid stress as much as possible. Rest and get plenty of sleep. °General instructions °· Take over-the-counter and prescription medicines only as told by your health care provider. °· While lying down, lie on your left side. This keeps pressure off your baby. °· While sitting or lying down, raise (elevate) your feet. Try putting some pillows under your lower legs. °· Exercise regularly. Ask your health care provider what kinds of exercise are best for you. °· Keep all prenatal and follow-up visits as told by your health care provider. This is important. °Contact a health care provider if: °· You have symptoms that your health care  provider told you may require more treatment or monitoring, such as: °? Fever. °? Vomiting. °? Headache. °Get help right away if: °· You have severe abdominal pain or vomiting that does not get better with treatment. °· You suddenly develop swelling in your hands, ankles, or face. °· You gain 4 lbs (1.8 kg) or more in 1 week. °· You develop vaginal bleeding, or you have blood in your urine. °· You do not feel your baby moving as much as usual. °· You have blurred or double vision. °· You have muscle twitching or sudden tightening (spasms). °· You have shortness of breath. °· Your lips or fingernails turn blue. °This information is not intended to replace advice given to you by your health care provider. Make sure you discuss any questions you have with your health care provider. °Document Released: 11/09/2010 Document Revised: 09/11/2015 Document Reviewed: 08/07/2015 °Elsevier Interactive Patient Education © 2018 [employer] ° °

## 2016-12-02 ENCOUNTER — Telehealth (HOSPITAL_COMMUNITY): Payer: Self-pay | Admitting: *Deleted

## 2016-12-02 ENCOUNTER — Other Ambulatory Visit: Payer: Self-pay | Admitting: Obstetrics and Gynecology

## 2016-12-02 ENCOUNTER — Encounter (HOSPITAL_COMMUNITY): Payer: Self-pay | Admitting: *Deleted

## 2016-12-02 NOTE — Telephone Encounter (Signed)
Preadmission screen  

## 2016-12-04 NOTE — H&P (Signed)
Crystal Newman is a 26 y.o. female, G7P1051 at 40 weeks, presenting for elective induction.  Pt has had some labial BP's with negative PIH workup.  Pt was a transfer of care at 27 weeks. Prenatal hx remarkable for GBS in urine, Rubella non-immune, hx of HSV and smoker.  Patient Active Problem List   Diagnosis Date Noted  . Vitamin D deficiency 07/25/2016  . History of 5 spontaneous abortions 07/22/2016  . 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 Rubella non-immune status, antepartum 11/18/2014  . 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 Hidradenitis 07/21/2011    History of present pregnancy: Patient entered care at first trimester at [facility]. EDC of 12/05/2016 was established by LMP.  Anatomy scan:  20 weeks, with normal findings and an posterior placenta.   Additional [place] evaluations:  For size Significant prenatal events:  None Last evaluation:  Last week  OB History    Gravida Para Term Preterm AB Living   SAB TAB Ectopic Multiple Live Births   5     0 1     Past Medical History:  Diagnosis Date  . Anxiety   . Asthma    sports induced  . Gestational hypertension   . Headache   . Hidradenitis   . HSV (herpes simplex virus) anogenital infection   . Infection    UTI-frequent  . Seizures (HCC)    unknown cause, last at age 56  . Short PR-normal QRS complex syndrome   . Syncope    neurally-mediated  . Vitamin D deficiency    Past Surgical History:  Procedure Laterality Date  . TONSILLECTOMY    . WISDOM TOOTH EXTRACTION     Family History: family history includes Cancer in her maternal aunt and paternal grandfather; Diabetes in her maternal grandmother and paternal grandmother; Heart disease in her maternal grandfather, maternal grandmother, mother, and paternal grandmother; Hyperlipidemia in her mother; Hypertension in her father, maternal grandmother, and mother. Social History:  reports that she has  been smoking.  She has a 4.00 pack-year smoking history. She has never used smokeless tobacco. She reports that she does not drink alcohol or use drugs.   Prenatal Transfer Tool  Maternal Diabetes: No Genetic Screening: Normal Maternal Ultrasounds/Referrals: Normal Fetal Ultrasounds or other Referrals:  None Maternal Substance Abuse:  Yes:  Type: Smoker Significant Maternal Medications:  None Significant Maternal Lab Results: None  TDAP Yes Flu No  ROS:  All 10 reviewed on admission and neg at visit in office  No Known Allergies     Last menstrual period 02/29/2016, unknown if currently breastfeeding.  Chest clear Heart RRR without murmur Abd gravid, NT, FH appropriate for gestational age Pelvic: 11/30/16 3/60/-1 Ext: Negative  FHR: strip on admission UCs: None  Prenatal labs: ABO, Rh: O/Positive/-- (07/11 0000) Antibody: Negative (07/11 0000) Rubella:  Non immune RPR: Nonreactive (07/11 0000)  HBsAg: Negative (07/11 0000)  HIV: Non-reactive (07/11 0000)  GBS: Positive (05/17 0000)  Pap:  2018 neg GC:  Neg Chlamydia:  Neg Genetic screenings:  NT neg, AFP neg Glucola:  155 3 hour 80-129-145-131 Other:   Hgb 12.2 at NOB, 11.0 at 28 weeks       Assessment/Plan: IUP at 40 weeks presents for elective induction of labor GBS positive Hx of HSV Rubella non-immune Smoker   Plan: Admit to [employer] Routine [facility] orders Pain med/epidural prn PCN G for GBS prophylaxis  Henderson Newcomer ProtheroCNM, MSN 12/04/2016, 10:44 PM

## 2016-12-05 ENCOUNTER — Inpatient Hospital Stay (HOSPITAL_COMMUNITY): Payer: 59 | Admitting: Anesthesiology

## 2016-12-05 ENCOUNTER — Encounter (HOSPITAL_COMMUNITY): Payer: Self-pay

## 2016-12-05 ENCOUNTER — Inpatient Hospital Stay (HOSPITAL_COMMUNITY)
Admission: RE | Admit: 2016-12-05 | Discharge: 2016-12-06 | DRG: 807 | Disposition: A | Discharge status: Home / Self Care | Payer: 59 | Source: Ambulatory Visit | Attending: Obstetrics and Gynecology | Admitting: Obstetrics and Gynecology

## 2016-12-05 DIAGNOSIS — Z3A40 40 weeks gestation of pregnancy: Secondary | ICD-10-CM | POA: Diagnosis not present

## 2016-12-05 DIAGNOSIS — O99334 Smoking (tobacco) complicating childbirth: Secondary | ICD-10-CM | POA: Diagnosis present

## 2016-12-05 DIAGNOSIS — O26893 Other specified pregnancy related conditions, third trimester: Secondary | ICD-10-CM | POA: Diagnosis present

## 2016-12-05 DIAGNOSIS — F17210 Nicotine dependence, cigarettes, uncomplicated: Secondary | ICD-10-CM | POA: Diagnosis present

## 2016-12-05 DIAGNOSIS — Z23 Encounter for immunization: Secondary | ICD-10-CM | POA: Diagnosis not present

## 2016-12-05 DIAGNOSIS — O99824 Streptococcus B carrier state complicating childbirth: Secondary | ICD-10-CM | POA: Diagnosis present

## 2016-12-05 DIAGNOSIS — O134 Gestational [pregnancy-induced] hypertension without significant proteinuria, complicating childbirth: Principal | ICD-10-CM | POA: Diagnosis present

## 2016-12-05 DIAGNOSIS — O133 Gestational [pregnancy-induced] hypertension without significant proteinuria, third trimester: Secondary | ICD-10-CM | POA: Diagnosis present

## 2016-12-05 LAB — CBC
HEMATOCRIT: 33.9 % — AB (ref 36.0–46.0)
HEMOGLOBIN: 11 g/dL — AB (ref 12.0–15.0)
MCH: 28.2 pg (ref 26.0–34.0)
MCHC: 32.4 g/dL (ref 30.0–36.0)
MCV: 86.9 fL (ref 78.0–100.0)
Platelets: 218 10*3/uL (ref 150–400)
RBC: 3.9 MIL/uL (ref 3.87–5.11)
RDW: 14.1 % (ref 11.5–15.5)
WBC: 13.7 10*3/uL — ABNORMAL HIGH (ref 4.0–10.5)

## 2016-12-05 LAB — TYPE AND SCREEN
ABO/RH(D): O POS
ANTIBODY SCREEN: NEGATIVE

## 2016-12-05 LAB — RPR: RPR Ser Ql: NONREACTIVE

## 2016-12-05 MED ORDER — ONDANSETRON HCL 4 MG/2ML IJ SOLN: 4.0000 mg | INTRAMUSCULAR | Status: DC | PRN

## 2016-12-05 MED ORDER — ONDANSETRON HCL 4 MG/2ML IJ SOLN: 4.0000 mg | Freq: Four times a day (QID) | INTRAMUSCULAR | Status: DC | PRN

## 2016-12-05 MED ORDER — MEDROXYPROGESTERONE ACETATE 150 MG/ML IM SUSP: 150.0000 mg | INTRAMUSCULAR | Status: DC | PRN

## 2016-12-05 MED ORDER — OXYTOCIN 40 UNITS IN LACTATED RINGERS INFUSION - SIMPLE MED: 2.5000 [IU]/h | INTRAVENOUS | Status: DC

## 2016-12-05 MED ORDER — PHENYLEPHRINE 40 MCG/ML (10ML) SYRINGE FOR IV PUSH (FOR BLOOD PRESSURE SUPPORT)
80.0000 ug | PREFILLED_SYRINGE | INTRAVENOUS | Status: DC | PRN
  Filled 2016-12-05: qty 5
  Filled 2016-12-05: qty 10

## 2016-12-05 MED ORDER — BENZOCAINE-MENTHOL 20-0.5 % EX AERO
1.0000 "application " | INHALATION_SPRAY | CUTANEOUS | Status: DC | PRN
  Administered 2016-12-06: 1 via TOPICAL
  Filled 2016-12-05: qty 56

## 2016-12-05 MED ORDER — OXYTOCIN 40 UNITS IN LACTATED RINGERS INFUSION - SIMPLE MED: 1.0000 m[IU]/min | INTRAVENOUS | Status: DC

## 2016-12-05 MED ORDER — PRENATAL MULTIVITAMIN CH
1.0000 | ORAL_TABLET | Freq: Every day | ORAL | Status: DC
  Administered 2016-12-06: 1 via ORAL
  Filled 2016-12-05: qty 1

## 2016-12-05 MED ORDER — LACTATED RINGERS IV SOLN: 500.0000 mL | INTRAVENOUS | Status: DC | PRN

## 2016-12-05 MED ORDER — ZOLPIDEM TARTRATE 5 MG PO TABS: 5.0000 mg | ORAL_TABLET | Freq: Every evening | ORAL | Status: DC | PRN

## 2016-12-05 MED ORDER — EPHEDRINE 5 MG/ML INJ
10.0000 mg | INTRAVENOUS | Status: DC | PRN
  Filled 2016-12-05: qty 2

## 2016-12-05 MED ORDER — DIPHENHYDRAMINE HCL 25 MG PO CAPS: 25.0000 mg | ORAL_CAPSULE | Freq: Four times a day (QID) | ORAL | Status: DC | PRN

## 2016-12-05 MED ORDER — OXYTOCIN 40 UNITS IN LACTATED RINGERS INFUSION - SIMPLE MED
1.0000 m[IU]/min | INTRAVENOUS | Status: DC
  Administered 2016-12-05: 2 m[IU]/min via INTRAVENOUS
  Filled 2016-12-05: qty 1000

## 2016-12-05 MED ORDER — ACETAMINOPHEN 325 MG PO TABS
650.0000 mg | ORAL_TABLET | ORAL | Status: DC | PRN
  Administered 2016-12-06: 650 mg via ORAL
  Filled 2016-12-05: qty 2

## 2016-12-05 MED ORDER — FLEET ENEMA 7-19 GM/118ML RE ENEM: 1.0000 | ENEMA | RECTAL | Status: DC | PRN

## 2016-12-05 MED ORDER — OXYTOCIN BOLUS FROM INFUSION
500.0000 mL | Freq: Once | INTRAVENOUS | Status: AC
  Administered 2016-12-05: 500 mL via INTRAVENOUS

## 2016-12-05 MED ORDER — WITCH HAZEL-GLYCERIN EX PADS: 1.0000 "application " | MEDICATED_PAD | CUTANEOUS | Status: DC | PRN

## 2016-12-05 MED ORDER — FENTANYL 2.5 MCG/ML BUPIVACAINE 1/10 % EPIDURAL INFUSION (WH - ANES)
14.0000 mL/h | INTRAMUSCULAR | Status: DC | PRN
  Administered 2016-12-05: 14 mL/h via EPIDURAL
  Filled 2016-12-05: qty 100

## 2016-12-05 MED ORDER — PENICILLIN G POTASSIUM 5000000 UNITS IJ SOLR
5.0000 10*6.[IU] | Freq: Once | INTRAMUSCULAR | Status: AC
  Administered 2016-12-05: 5 10*6.[IU] via INTRAVENOUS
  Filled 2016-12-05: qty 5

## 2016-12-05 MED ORDER — TETANUS-DIPHTH-ACELL PERTUSSIS 5-2.5-18.5 LF-MCG/0.5 IM SUSP: 0.5000 mL | Freq: Once | INTRAMUSCULAR | Status: DC

## 2016-12-05 MED ORDER — LIDOCAINE HCL (PF) 1 % IJ SOLN
INTRAMUSCULAR | Status: DC | PRN
  Administered 2016-12-05 (×2): 5 mL

## 2016-12-05 MED ORDER — PNEUMOCOCCAL VAC POLYVALENT 25 MCG/0.5ML IJ INJ
0.5000 mL | INJECTION | INTRAMUSCULAR | Status: AC
  Administered 2016-12-06: 0.5 mL via INTRAMUSCULAR
  Filled 2016-12-05: qty 0.5

## 2016-12-05 MED ORDER — SOD CITRATE-CITRIC ACID 500-334 MG/5ML PO SOLN: 30.0000 mL | ORAL | Status: DC | PRN

## 2016-12-05 MED ORDER — SIMETHICONE 80 MG PO CHEW: 80.0000 mg | CHEWABLE_TABLET | ORAL | Status: DC | PRN

## 2016-12-05 MED ORDER — OXYCODONE HCL 5 MG PO TABS: 5.0000 mg | ORAL_TABLET | ORAL | Status: DC | PRN

## 2016-12-05 MED ORDER — FENTANYL CITRATE (PF) 100 MCG/2ML IJ SOLN: 50.0000 ug | INTRAMUSCULAR | Status: DC | PRN

## 2016-12-05 MED ORDER — LACTATED RINGERS IV SOLN
500.0000 mL | Freq: Once | INTRAVENOUS | Status: AC
  Administered 2016-12-05: 500 mL via INTRAVENOUS

## 2016-12-05 MED ORDER — LIDOCAINE HCL (PF) 1 % IJ SOLN
30.0000 mL | INTRAMUSCULAR | Status: DC | PRN
  Filled 2016-12-05: qty 30

## 2016-12-05 MED ORDER — SENNOSIDES-DOCUSATE SODIUM 8.6-50 MG PO TABS
2.0000 | ORAL_TABLET | ORAL | Status: DC
  Administered 2016-12-05: 2 via ORAL
  Filled 2016-12-05: qty 2

## 2016-12-05 MED ORDER — DIBUCAINE 1 % RE OINT
1.0000 "application " | TOPICAL_OINTMENT | RECTAL | Status: DC | PRN
  Administered 2016-12-06: 1 via RECTAL
  Filled 2016-12-05: qty 28

## 2016-12-05 MED ORDER — ACETAMINOPHEN 325 MG PO TABS: 650.0000 mg | ORAL_TABLET | ORAL | Status: DC | PRN

## 2016-12-05 MED ORDER — IBUPROFEN 600 MG PO TABS
600.0000 mg | ORAL_TABLET | Freq: Four times a day (QID) | ORAL | Status: DC
  Administered 2016-12-05 – 2016-12-06 (×4): 600 mg via ORAL
  Filled 2016-12-05 (×4): qty 1

## 2016-12-05 MED ORDER — ONDANSETRON HCL 4 MG PO TABS: 4.0000 mg | ORAL_TABLET | ORAL | Status: DC | PRN

## 2016-12-05 MED ORDER — LACTATED RINGERS IV SOLN
INTRAVENOUS | Status: DC
  Administered 2016-12-05: 08:00:00 via INTRAVENOUS

## 2016-12-05 MED ORDER — TERBUTALINE SULFATE 1 MG/ML IJ SOLN
0.2500 mg | Freq: Once | INTRAMUSCULAR | Status: DC | PRN
  Filled 2016-12-05: qty 1

## 2016-12-05 MED ORDER — INFLUENZA VAC SPLIT QUAD 0.5 ML IM SUSY
0.5000 mL | PREFILLED_SYRINGE | INTRAMUSCULAR | Status: AC
  Administered 2016-12-06: 0.5 mL via INTRAMUSCULAR
  Filled 2016-12-05: qty 0.5

## 2016-12-05 MED ORDER — PENICILLIN G POT IN DEXTROSE 60000 UNIT/ML IV SOLN
3.0000 10*6.[IU] | INTRAVENOUS | Status: DC
  Administered 2016-12-05: 3 10*6.[IU] via INTRAVENOUS
  Filled 2016-12-05 (×4): qty 50

## 2016-12-05 MED ORDER — PHENYLEPHRINE 40 MCG/ML (10ML) SYRINGE FOR IV PUSH (FOR BLOOD PRESSURE SUPPORT)
80.0000 ug | PREFILLED_SYRINGE | INTRAVENOUS | Status: DC | PRN
  Filled 2016-12-05: qty 5

## 2016-12-05 MED ORDER — COCONUT OIL OIL: 1.0000 "application " | TOPICAL_OIL | Status: DC | PRN

## 2016-12-05 MED ORDER — DIPHENHYDRAMINE HCL 50 MG/ML IJ SOLN: 12.5000 mg | INTRAMUSCULAR | Status: DC | PRN

## 2016-12-05 NOTE — Anesthesia Pain Management Evaluation Note (Signed)
  CRNA Pain Management Visit Note  Patient: Crystal Newman, 26 y.o., female  "Hello I am a member of the anesthesia team at [facility]. We have an anesthesia team available at all times to provide care throughout the hospital, including epidural management and anesthesia for C-section. I don't know your plan for the delivery whether it a natural birth, water birth, IV sedation, nitrous supplementation, doula or epidural, but we want to meet your pain goals."   1.Was your pain managed to your expectations on prior hospitalizations?   Yes   2.What is your expectation for pain management during this hospitalization?     Epidural  3.How can we help you reach that goal? Epidural   Record the patient's initial score and the patient's pain goal.   Pain: 0  Pain Goal: 5 The [facility] wants you to be able to say your pain was always managed very well.  Bergen Magner 12/05/2016

## 2016-12-05 NOTE — Plan of Care (Signed)
Problem: Urinary Elimination: Goal: Ability to reestablish a normal urinary elimination pattern will improve Outcome: Completed/Met Date Met: 12/05/16 Patient is up without help, voided and bleeding is WNL.  Patient admission work and teaching completed,bladder, bowel and bleeding education done

## 2016-12-05 NOTE — Anesthesia Preprocedure Evaluation (Signed)
Anesthesia Evaluation  Patient identified by MRN, date of birth, ID band Patient awake    Reviewed: Allergy & Precautions, H&P , NPO status , Patient's Chart, lab work & pertinent test results  History of Anesthesia Complications Negative for: history of anesthetic complications  Airway Mallampati: II  TM Distance: >3 FB Neck ROM: full    Dental no notable dental hx. (+) Teeth Intact   Pulmonary neg pulmonary ROS, Current Smoker,    Pulmonary exam normal breath sounds clear to auscultation       Cardiovascular hypertension, negative cardio ROS Normal cardiovascular exam Rhythm:regular Rate:Normal     Neuro/Psych negative neurological ROS  negative psych ROS   GI/Hepatic negative GI ROS, Neg liver ROS,   Endo/Other  negative endocrine ROS  Renal/GU negative Renal ROS  negative genitourinary   Musculoskeletal   Abdominal   Peds  Hematology negative hematology ROS (+)   Anesthesia Other Findings   Reproductive/Obstetrics (+) Pregnancy                             Anesthesia Physical Anesthesia Plan  ASA: II  Anesthesia Plan: Epidural   Post-op Pain Management:    Induction:   PONV Risk Score and Plan:   Airway Management Planned:   Additional Equipment:   Intra-op Plan:   Post-operative Plan:   Informed Consent: I have reviewed the patients History and Physical, chart, labs and discussed the procedure including the risks, benefits and alternatives for the proposed anesthesia with the patient or authorized representative who has indicated his/her understanding and acceptance.       Plan Discussed with:   Anesthesia Plan Comments:         Anesthesia Quick Evaluation  

## 2016-12-05 NOTE — Progress Notes (Signed)
Crystal Newman is a 26 Newman.o. [id number] at [redacted]w[redacted]d  Subjective: No complaints.  Ctx pain 6/10.  Denies LOF.  +FM and no VB.  Objective: BP (!) 131/94   Pulse 76   Temp 99.8 F (37.7 C) (Oral)   Resp 17   Ht  (1.676 m)   Wt 94.3 kg (208 lb)   LMP 02/29/2016   SpO2 100%   Breastfeeding? Unknown   BMI 33.57 kg/m  No intake/output data recorded. Total I/O In: -  Out: 600 [Blood:600]  FHT:  FHR: cat 1   UC:   regular, every 2-3 minutes SVE:   Dilation: 4-5 Effacement (%): 70 Station: -1 Exam by::A.Su Hilt, MD  Labs: Lab Results  Component Value Date   WBC 13.7 (H) 12/05/2016   HGB 11.0 (L) 12/05/2016   HCT 33.9 (L) 12/05/2016   MCV 86.9 12/05/2016   PLT 218 12/05/2016    Assessment / Plan: Doing well  Labor: Progressing on Pitocin, will AROM now Preeclampsia:  no signs or symptoms of toxicity Fetal Wellbeing:  Category I Pain Control:  Epidural I/D:  GBS + on PCN Anticipated MOD:  NSVD  Crystal Newman 12/05/2016, 2:28 PM

## 2016-12-05 NOTE — Anesthesia Procedure Notes (Signed)
Epidural Patient location during procedure: OB  Staffing Anesthesiologist: Damontae Loppnow Performed: anesthesiologist   Preanesthetic Checklist Completed: patient identified, site marked, surgical consent, pre-op evaluation, timeout performed, IV checked, risks and benefits discussed and monitors and equipment checked  Epidural Patient position: sitting Prep: DuraPrep Patient monitoring: heart rate, continuous pulse ox and blood pressure Approach: right paramedian Location: L3-L4 Injection technique: LOR saline  Needle:  Needle type: Tuohy  Needle gauge: 17 G Needle length: 9 cm and 9 Needle insertion depth: 6 cm Catheter type: closed end flexible Catheter size: 20 Guage Catheter at skin depth: 10 cm Test dose: negative  Assessment Events: blood not aspirated, injection not painful, no injection resistance, negative IV test and no paresthesia  Additional Notes Patient identified. Risks/Benefits/Options discussed with patient including but not limited to bleeding, infection, nerve damage, paralysis, failed block, incomplete pain control, headache, blood pressure changes, nausea, vomiting, reactions to medication both or allergic, itching and postpartum back pain. Confirmed with bedside nurse the patient's most recent platelet count. Confirmed with patient that they are not currently taking any anticoagulation, have any bleeding history or any family history of bleeding disorders. Patient expressed understanding and wished to proceed. All questions were answered. Sterile technique was used throughout the entire procedure. Please see nursing notes for vital signs. Test dose was given through epidural needle and negative prior to continuing to dose epidural or start infusion. Warning signs of high block given to the patient including shortness of breath, tingling/numbness in hands, complete motor block, or any concerning symptoms with instructions to call for help. Patient was given  instructions on fall risk and not to get out of bed. All questions and concerns addressed with instructions to call with any issues.     

## 2016-12-05 NOTE — Anesthesia Postprocedure Evaluation (Signed)
Anesthesia Post Note  Patient: Crystal Newman  Procedure(s) Performed: AN AD HOC LABOR EPIDURAL     Patient location during evaluation: Mother Baby Anesthesia Type: Epidural Level of consciousness: awake Pain management: satisfactory to patient Vital Signs Assessment: post-procedure vital signs reviewed and stable Respiratory status: spontaneous breathing Cardiovascular status: stable Anesthetic complications: no    Last Vitals:  Vitals:   12/05/16 1535 12/05/16 1649  BP: 115/69 118/75  Pulse: 63 66  Resp: 18 18  Temp: 36.8 C 36.7 C  SpO2:      Last Pain:  Vitals:   12/05/16 1649  TempSrc: Oral  PainSc: 0-No pain   Pain Goal:                 [employer]

## 2016-12-06 LAB — CBC
HEMATOCRIT: 27.5 % — AB (ref 36.0–46.0)
HEMOGLOBIN: 9 g/dL — AB (ref 12.0–15.0)
MCH: 28.7 pg (ref 26.0–34.0)
MCHC: 32.7 g/dL (ref 30.0–36.0)
MCV: 87.6 fL (ref 78.0–100.0)
Platelets: 196 10*3/uL (ref 150–400)
RBC: 3.14 MIL/uL — AB (ref 3.87–5.11)
RDW: 14.1 % (ref 11.5–15.5)
WBC: 12.8 10*3/uL — ABNORMAL HIGH (ref 4.0–10.5)

## 2016-12-06 MED ORDER — MEASLES, MUMPS & RUBELLA VAC ~~LOC~~ INJ
0.5000 mL | INJECTION | Freq: Once | SUBCUTANEOUS | Status: AC
  Administered 2016-12-06: 0.5 mL via SUBCUTANEOUS
  Filled 2016-12-06: qty 0.5

## 2016-12-06 NOTE — Progress Notes (Signed)
PROGRESS NOTE  I have reviewed the patient's vital signs, labs, and notes. I agree with the previous note from the Certified Nurse Midwife.  Leonard Schwartz, M.D. 12/06/2016

## 2016-12-06 NOTE — Discharge Summary (Signed)
OB Discharge Summary     Patient Name: Crystal Newman DOB: 20-May-1990 MRN: [id number]  Date of admission: 12/05/2016 Delivering MD: Osborn Coho   Date of discharge: 12/06/2016  Admitting diagnosis: INDUCTION Intrauterine pregnancy: [redacted]w[redacted]d     Secondary diagnosis:  Active Problems:   Pregnancy induced hypertension, third trimester  Additional problems: None     Discharge diagnosis: Term Pregnancy Delivered                                                                                                Post partum procedures:None  Augmentation: None  Complications: None  Hospital course:  Induction of Labor With Vaginal Delivery   26 y.o. yo [id number] at [redacted]w[redacted]d was admitted to the hospital 12/05/2016 for induction of labor.  Indication for induction: Favorable cervix at term and Gestational hypertension.  Patient had an uncomplicated labor course as follows: Membrane Rupture Time/Date: 11:42 AM ,12/05/2016   Intrapartum Procedures: Episiotomy: None [1]                                         Lacerations:  1st degree [2]  Patient had delivery of a Viable infant.  Information for the patient's newborn:  Renatta, Shrieves Girl Taj [[id number]]  Delivery Method: Vaginal, Spontaneous Delivery (Filed from Delivery Summary)   12/05/2016  Details of delivery can be found in separate delivery note.  Patient had a routine postpartum course. Patient is discharged home 12/06/16.  Physical exam  Vitals:   12/05/16 1535 12/05/16 1649 12/05/16 1955 12/06/16 0312  BP: 115/69 118/75 126/69 114/68  Pulse: 63 66 69 67  Resp: Temp: 98.2 F (36.8 C) 98.1 F (36.7 C) 98.2 F (36.8 C) 98 F (36.7 C)  TempSrc: Oral Oral Oral   SpO2:      Weight:      Height:       General: alert, cooperative and no distress Lochia: appropriate Uterine Fundus: firm Incision: N/A DVT Evaluation: No evidence of DVT seen on physical exam. Negative Homan's sign. Labs: Lab Results  Component Value Date    WBC 12.8 (H) 12/06/2016   HGB 9.0 (L) 12/06/2016   HCT 27.5 (L) 12/06/2016   MCV 87.6 12/06/2016   PLT 196 12/06/2016   CMP Latest Ref Rng & Units 11/17/2016  Glucose 65 - 99 mg/dL 80  BUN 6 - 20 mg/dL 8  Creatinine 4.16 - 6.06 mg/dL 3.01  Sodium 601 - 093 mmol/L 135  Potassium 3.5 - 5.1 mmol/L 3.6  Chloride 101 - 111 mmol/L 103  CO2 22 - 32 mmol/L 24  Calcium 8.9 - 10.3 mg/dL 8.2(L)  Total Protein 6.5 - 8.1 g/dL 6.2(L)  Total Bilirubin 0.3 - 1.2 mg/dL 0.9  Alkaline Phos 38 - 126 U/L 197(H)  AST 15 - 41 U/L 17  ALT 14 - 54 U/L 11(L)    Discharge instruction: per After Visit Summary and "Baby and Me Booklet".  After visit meds:  PNV, Ibuprofen  Diet: routine  diet  Activity: Advance as tolerated. Pelvic rest for 6 weeks.   Outpatient follow up:6 weeks Follow up Appt:No future appointments. Follow up Visit:No Follow-up on file.  Postpartum contraception: Undecided  Newborn Data: Live born female  Birth Weight: 8 lb 15 oz (4055 g) APGAR: 9, 9  Baby Feeding: Bottle Disposition:home with mother   12/06/2016 Kenney Houseman, CNM

## 2018-01-16 ENCOUNTER — Ambulatory Visit (INDEPENDENT_AMBULATORY_CARE_PROVIDER_SITE_OTHER): Payer: 59 | Admitting: Family Medicine

## 2018-01-16 ENCOUNTER — Encounter: Payer: Self-pay | Admitting: Family Medicine

## 2018-01-16 ENCOUNTER — Telehealth: Payer: Self-pay | Admitting: Cardiology

## 2018-01-16 ENCOUNTER — Other Ambulatory Visit: Payer: Self-pay

## 2018-01-16 VITALS — BP 118/78 | HR 95 | Temp 98.3°F | Ht 66.0 in | Wt 187.0 lb

## 2018-01-16 DIAGNOSIS — R42 Dizziness and giddiness: Secondary | ICD-10-CM

## 2018-01-16 DIAGNOSIS — D72829 Elevated white blood cell count, unspecified: Secondary | ICD-10-CM | POA: Diagnosis not present

## 2018-01-16 DIAGNOSIS — R102 Pelvic and perineal pain: Secondary | ICD-10-CM

## 2018-01-16 DIAGNOSIS — R9431 Abnormal electrocardiogram [ECG] [EKG]: Secondary | ICD-10-CM | POA: Diagnosis not present

## 2018-01-16 DIAGNOSIS — R55 Syncope and collapse: Secondary | ICD-10-CM | POA: Diagnosis not present

## 2018-01-16 DIAGNOSIS — R002 Palpitations: Secondary | ICD-10-CM | POA: Diagnosis not present

## 2018-01-16 LAB — POCT CBC
Granulocyte percent: 68.6 %G (ref 37–80)
HEMATOCRIT: 41.2 % — AB (ref 29–41)
HEMOGLOBIN: 13.8 g/dL — AB (ref 9.5–13.5)
Lymph, poc: 2.5 (ref 0.6–3.4)
MCH, POC: 29.6 pg (ref 27–31.2)
MCHC: 33.5 g/dL (ref 31.8–35.4)
MCV: 88.5 fL (ref 76–111)
MID (cbc): 0.7 (ref 0–0.9)
MPV: 8.5 fL (ref 0–99.8)
PLATELET COUNT, POC: 266 10*3/uL (ref 142–424)
POC GRANULOCYTE: 7.1 — AB (ref 2–6.9)
POC LYMPH %: 24.4 % (ref 10–50)
POC MID %: 7 %M (ref 0–12)
RBC: 4.65 M/uL (ref 4.04–5.48)
RDW, POC: 14.2 %
WBC: 10.4 10*3/uL — AB (ref 4.6–10.2)

## 2018-01-16 LAB — POC MICROSCOPIC URINALYSIS (UMFC): Mucus: ABSENT

## 2018-01-16 LAB — POCT URINALYSIS DIP (MANUAL ENTRY)
BILIRUBIN UA: NEGATIVE
Glucose, UA: NEGATIVE mg/dL
Ketones, POC UA: NEGATIVE mg/dL
Leukocytes, UA: NEGATIVE
Nitrite, UA: NEGATIVE
PH UA: 6 (ref 5.0–8.0)
PROTEIN UA: NEGATIVE mg/dL
RBC UA: NEGATIVE
Spec Grav, UA: 1.01 (ref 1.010–1.025)
Urobilinogen, UA: 0.2 E.U./dL

## 2018-01-16 LAB — GLUCOSE, POCT (MANUAL RESULT ENTRY): POC GLUCOSE: 73 mg/dL (ref 70–99)

## 2018-01-16 NOTE — Progress Notes (Signed)
Subjective:    Patient ID: Crystal Newman, female    DOB: 11/20/1990, 27 y.o.   MRN: [id number]  HPI Crystal Newman is a 27 y.o. female Presents today for: Chief Complaint  Patient presents with  . Establish Care    hx. of sz  . Dizziness  . Loss of Consciousness    yesterday morning-1st time in 5 yrs.     New patient to me.  Here to discuss dizziness with loss of conscience yesterday morning.  Problem list history of seizures, history of short PR syndrome.  Past few months has had episodes where things are spinning and lightheaded. Not constant. Episodes 1-2 x week.  Last all day at times, other times for few months. No blood in stools,, no dark stools.  Does not have chest pain. Occasional headaches. Does note heart fluttering sensation when feeling lightheaded symptoms, then resolves as other symptoms (lightheadedness and spinning improve).  Tx: none.   Nexplanon placed in July, changed to IUD recently due to fatigue. Trouble sleeping, fatigue. No bloodwork at [facility].   Hx of seizures and syncopal episodes in past. None in past 5 years until yesterday. Home by self, talking to husband on phone - felt like room was spinning again. Stood up to get clothes for work. Then notes she woke up on floor. Knot on front of head, no bleeding no other injuries.  Non witnessed. Unknown timing of LOC, but less than 30 minutes. No incontinence. No tongue injury. No other injuries. Fatigued today. Not dizzy or spinning feeling at present.   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 Graciela Husbands. Treated with "salt pill" that caused HA. No pacemaker. Did wear heart monitor.   Treated with zoloft for anxiety in past.  Has been taking QD, but has missed dose past few weeks. Missed 5 or 6 doses during that time, and has been 2 days back to back a few weeks ago.   No recent fevers. Diagnosed with PNA at [facility] last week. Treated with Zpak and tamiflu. Minimal cough  initially,  none now.   No  dysuria, frequency, hematuria or fever.    Patient Active Problem List   Diagnosis Date Noted  . Pregnancy induced hypertension, third trimester 12/05/2016  . Vitamin D deficiency 07/25/2016  . History of 5 spontaneous abortions 07/22/2016  . 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 Rubella non-immune status, antepartum 11/18/2014  . 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 Hidradenitis 07/21/2011   Past Medical History:  Diagnosis Date  . Anxiety   . Asthma    sports induced  . Gestational hypertension   . Headache   . Hidradenitis   . HSV (herpes simplex virus) anogenital infection   . Infection    UTI-frequent  . Seizures (HCC)    unknown cause, last at age 33  . Short PR-normal QRS complex syndrome   . Syncope    neurally-mediated  . Vitamin D deficiency    Past Surgical History:  Procedure Laterality Date  . TONSILLECTOMY    . WISDOM TOOTH EXTRACTION     No Known Allergies Prior to Admission medications   Medication Sig Start Date End Date Taking? Authorizing Provider  acetaminophen (TYLENOL) 500 MG tablet Take 1,000 mg by mouth every 6 (six) hours as needed for moderate pain or headache.   Yes [provider]  cyclobenzaprine (FLEXERIL) 10 MG tablet Take 1 tablet (10 mg total) by  mouth 3 (three) times daily as needed for muscle spasms. 08/22/16  Yes Constant, Peggy, MD  Levonorgestrel (LILETTA, 52 MG,) 19.5 MCG/DAY IUD IUD Liletta 19.5 mcg/24 hrs (5 yrs) 52 mg intrauterine device  Take 1 device by intrauterine route.   Yes [provider]  sertraline (ZOLOFT) 100 MG tablet Take 100 mg by mouth daily.   Yes [provider]   Social History   Socioeconomic History  . Marital status: Married    Spouse name: Not on file  . Number of children: Not on file  . Years of education: Not on file  . Highest education level: Not on file  Occupational History  . Not on file  Social Needs  .  Financial resource strain: Not on file  . Food insecurity:    Worry: Not on file    Inability: Not on file  . Transportation needs:    Medical: Not on file    Non-medical: Not on file  Tobacco Use  . Smoking status: Current Every Day Smoker    Packs/day: 0.50    Years: 8.00    Pack years: 4.00  . Smokeless tobacco: Never Used  Substance and Sexual Activity  . Alcohol use: No  . Drug use: No  . Sexual activity: Yes    Partners: Male    Birth control/protection: None  Lifestyle  . Physical activity:    Days per week: Not on file    Minutes per session: Not on file  . Stress: Not on file  Relationships  . Social connections:    Talks on phone: Not on file    Gets together: Not on file    Attends religious service: Not on file    Active member of club or organization: Not on file    Attends meetings of clubs or organizations: Not on file    Relationship status: Not on file  . Intimate partner violence:    Fear of current or ex partner: Not on file    Emotionally abused: Not on file    Physically abused: Not on file    Forced sexual activity: Not on file  Other Topics Concern  . Not on file  Social History Narrative  . Not on file   Review of Systems As above in HPI.     Objective:   Physical Exam  Constitutional: She is oriented to person, place, and time. She appears well-developed and well-nourished.  HENT:  Head: Normocephalic and atraumatic.  Eyes: Pupils are equal, round, and reactive to light. Conjunctivae and EOM are normal.  Neck: Carotid bruit is not present.  Cardiovascular: Normal rate, regular rhythm, normal heart sounds and intact distal pulses.  No apparent murmur, no ectopy  Pulmonary/Chest: Effort normal and breath sounds normal.  Abdominal: Soft. She exhibits no pulsatile midline mass. There is tenderness (Minimal suprapubic, no rebound no guarding, negative McBurney's.).  Neurological: She is alert and oriented to person, place, and time.  Skin:  Skin is warm and dry.  Psychiatric: She has a normal mood and affect. Her behavior is normal.  Vitals reviewed.  Vitals:   01/16/18 1518  BP: 118/78  Pulse: 95  Temp: 98.3 F (36.8 C)  TempSrc: Oral  SpO2: 98%  Weight: 187 lb (84.8 kg)  Height: 5\' 6"  (1.676 m)   Results for orders placed or performed in visit on 01/16/18  POCT CBC  Result Value Ref Range   WBC 10.4 (A) 4.6 - 10.2 K/uL   Lymph, poc 2.5  0.6 - 3.4   POC LYMPH PERCENT 24.4 10 - 50 %L   MID (cbc) 0.7 0 - 0.9   POC MID % 7.0 0 - 12 %M   POC Granulocyte 7.1 (A) 2 - 6.9   Granulocyte percent 68.6 37 - 80 %G   RBC 4.65 4.04 - 5.48 M/uL   Hemoglobin 13.8 (A) 9.5 - 13.5 g/dL   HCT, POC 16.1 (A) 29 - 41 %   MCV 88.5 76 - 111 fL   MCH, POC 29.6 27 - 31.2 pg   MCHC 33.5 31.8 - 35.4 g/dL   RDW, POC 09.6 %   Platelet Count, POC 266 142 - 424 K/uL   MPV 8.5 0 - 99.8 fL  POCT glucose (manual entry)  Result Value Ref Range   POC Glucose 73 70 - 99 mg/dl  POCT urinalysis dipstick  Result Value Ref Range   Color, UA yellow yellow   Clarity, UA clear clear   Glucose, UA negative negative mg/dL   Bilirubin, UA negative negative   Ketones, POC UA negative negative mg/dL   Spec Grav, UA 0.454 0.981 - 1.025   Blood, UA negative negative   pH, UA 6.0 5.0 - 8.0   Protein Ur, POC negative negative mg/dL   Urobilinogen, UA 0.2 0.2 or 1.0 E.U./dL   Nitrite, UA Negative Negative   Leukocytes, UA Negative Negative  POCT Microscopic Urinalysis ([facility])  Result Value Ref Range   WBC,UR,HPF,POC None None WBC/hpf   RBC,UR,HPF,POC None None RBC/hpf   Bacteria None None, Too numerous to count   Mucus Absent Absent   Epithelial Cells, UR Per Microscopy None None, Too numerous to count cells/hpf   EKG: Sinus rhythm, short PR syndrome with PR interval of 94.  No apparent ectopic beats on rhythm strip. Previous EKG in 2012, PR 108.  No apparent changes otherwise.   40 minutes of face-to-face care with repeat evaluation and  discussion of plan, chart review.  Greater than 50% counseling     Assessment & Plan:  Crystal Newman is a 27 y.o. female Syncope, unspecified syncope type - Plan: POCT CBC, POCT glucose (manual entry), Ambulatory referral to Cardiac Electrophysiology Shortened PR interval - Plan: EKG 12-Lead, Ambulatory referral to Cardiac Electrophysiology Palpitations - Plan: EKG 12-Lead, TSH, Basic metabolic panel Dizziness - Plan: EKG 12-Lead, Basic metabolic panel  -Dizziness/lightheadedness episodes past few months with syncopal event yesterday.  History of short PR syndrome, seen again on EKG today.  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  No acute findings on EKG and rhythm strip in office  -CBC overall reassuring except for borderline leukocytosis, possibly related to recent illness.  Glucose okay, check TSH, BMP.  -We will have evaluated by electrophysiology tomorrow, after discussion with cardiology on call.  Their office will call patient tomorrow.  -Avoid driving for now, and if any return of syncopal symptoms advised to proceed to ER or call 911.   Leukocytosis, unspecified type - Plan: POCT urinalysis dipstick, POCT Microscopic Urinalysis ([facility]) Suprapubic abdominal pain - Plan: POCT urinalysis dipstick, POCT Microscopic Urinalysis ([facility])  -Minimal suprapubic tenderness on exam but denies other abdominal symptoms.  Leukocytosis may be related to recent illness.  Afebrile, reassuring urinalysis.  -RTC/ER precautions if increasing abdominal symptoms or fever/worsening symptoms.   No orders of the defined types were placed in this encounter.  Patient Instructions   I will check some electrolyte tests and thyroid test, but those  are less likely the cause of your symptoms.  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 Graciela Husbands in the past.  For now make sure to drink plenty of fluids  and increase salt intake.  Do not drive until evaluated by Dr. Graciela Husbands or electrophysiologist.  Their office should be calling you tomorrow morning for appointment,  but if you have not heard from them by 9 AM, please call [phone].  If any further syncope or passing out symptoms, proceed to the emergency room or call 911.   If you have lab work done today you will be contacted with your lab results within the next 2 weeks.  If you have not heard from [place] then please contact us. The fastest way to get your results is to register for My Chart.   IF you received an x-ray today, you will receive an invoice from [facility]. Please contact [facility] at [phone] with questions or concerns regarding your invoice.   IF you received labwork today, you will receive an invoice from [facility]. Please contact [facility] at [phone] with questions or concerns regarding your invoice.   Our billing staff will not be able to assist you with questions regarding bills from these companies.  You will be contacted with the lab results as soon as they are available. The fastest way to get your results is to activate your My Chart account. Instructions are located on the last page of this paperwork. If you have not heard from [place] regarding the results in 2 weeks, please contact this office.      Signed,   Meredith Staggers, MD Primary Care at [facility].  01/16/18 6:28 PM

## 2018-01-16 NOTE — Telephone Encounter (Signed)
Pt with syncope and urgent care talked to Dr. Mayford Knife  and she will need to see EP on 01/17/18 per Dr. Mayford Knife.  She is Dr. Graciela Husbands pt.  Please arrange.

## 2018-01-16 NOTE — Patient Instructions (Addendum)
I will check some electrolyte tests and thyroid test, but those are less likely the cause of your symptoms.  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 Graciela HusbandsKlein in the past.  For now make sure to drink plenty of fluids and increase salt intake.  Do not drive until evaluated by Dr. Graciela HusbandsKlein or electrophysiologist.  Their office should be calling you tomorrow morning for appointment,  but if you have not heard from them by 9 AM, please call [phone].   If any further syncope or passing out symptoms, proceed to the emergency room or call 911.   If you have lab work done today you will be contacted with your lab results within the next 2 weeks.  If you have not heard from us then please contact us. The fastest way to get your results is to register for My Chart.   IF you received an x-ray today, you will receive an invoice from [facility]. Please contact [facility] at [phone] with questions or concerns regarding your invoice.   IF you received labwork today, you will receive an invoice from [facility]. Please contact [facility] at [phone] with questions or concerns regarding your invoice.   Our billing staff will not be able to assist you with questions regarding bills from these companies.  You will be contacted with the lab results as soon as they are available. The fastest way to get your results is to activate your My Chart account. Instructions are located on the last page of this paperwork. If you have not heard from us regarding the results in 2 weeks, please contact this office.

## 2018-01-17 ENCOUNTER — Telehealth: Payer: Self-pay | Admitting: Family Medicine

## 2018-01-17 LAB — BASIC METABOLIC PANEL
BUN / CREAT RATIO: 10 (ref 9–23)
BUN: 7 mg/dL (ref 6–20)
CHLORIDE: 105 mmol/L (ref 96–106)
CO2: 24 mmol/L (ref 20–29)
Calcium: 9.3 mg/dL (ref 8.7–10.2)
Creatinine, Ser: 0.68 mg/dL (ref 0.57–1.00)
GFR calc non Af Amer: 119 mL/min/{1.73_m2} (ref >59)
GFR, EST AFRICAN AMERICAN: 137 mL/min/{1.73_m2} (ref >59)
GLUCOSE: 84 mg/dL (ref 65–99)
Potassium: 4.5 mmol/L (ref 3.5–5.2)
Sodium: 143 mmol/L (ref 134–144)

## 2018-01-17 LAB — TSH: TSH: 1.11 u[IU]/mL (ref 0.450–4.500)

## 2018-01-17 NOTE — Telephone Encounter (Signed)
Follow up   Patient is calling to checkup on when her appt will be scheduled with Dr. Graciela HusbandsKlein. Please advise.

## 2018-01-17 NOTE — Telephone Encounter (Signed)
Per Dr Graciela HusbandsKlein, pt should have an event monitor placed prior to her appointment. Pt will be scheduled for next available POTS spot. Plan on working pt in sooner if monitor shows concern.

## 2018-01-17 NOTE — Telephone Encounter (Signed)
SPOKE TO PATIENT ADV HER THAT REFERRAL WAS SENT THIS MORNING  SHE STATED SHE HAD SPOKEN WITH DR Koren BoundKLEINS [facility] AND THEY HAD RECVD HER REFERRAL BUT TOLD HER THEY WOULD CALL HER BACK TODAY WITH AN APPNT.  SHE HAS NOT RECVD A CALLBACK AS OF YET.

## 2018-01-18 ENCOUNTER — Ambulatory Visit (INDEPENDENT_AMBULATORY_CARE_PROVIDER_SITE_OTHER): Payer: 59

## 2018-01-18 DIAGNOSIS — R55 Syncope and collapse: Secondary | ICD-10-CM

## 2018-02-22 ENCOUNTER — Ambulatory Visit: Payer: 59 | Admitting: Family Medicine

## 2018-03-22 ENCOUNTER — Encounter: Payer: Self-pay | Admitting: Internal Medicine

## 2018-03-22 ENCOUNTER — Ambulatory Visit (INDEPENDENT_AMBULATORY_CARE_PROVIDER_SITE_OTHER): Payer: 59 | Admitting: Internal Medicine

## 2018-03-22 VITALS — BP 120/77 | HR 75 | Ht 67.0 in | Wt 187.6 lb

## 2018-03-22 DIAGNOSIS — G901 Familial dysautonomia [Riley-Day]: Secondary | ICD-10-CM | POA: Diagnosis not present

## 2018-03-22 NOTE — Progress Notes (Signed)
ELECTROPHYSIOLOGY CONSULT NOTE  Patient ID: Crystal Newman, MRN: [id number], DOB/AGE: [birth year] 28 y.o. Admit date: (Not on file) Date of Consult: 03/22/2018  Primary Physician: Shade Flood, MD Primary Cardiologist: new     Crystal Newman is a 28 y.o. female who is being seen today for the evaluation of spells at the request of Dr Neva Seat.    HPI Crystal Newman is a 28 y.o. female referred because of spells.  These are described as dizzy but with a sense of environmental rotation that is rather rapid and violent.  It is not attenuated by lying down.  Aggravated by rotation of her head.  Sometimes associated with nausea.  Significant residual fatigue.  She has found herself on the ground on one occasion.  She finds it when she stands up she needs to hold on a little bit for balance.  Her second child is about a year old.  She has had significant fatigue in general over the last number of months.  Also had palpitations.  Was given an event recorder--personally reviewed today with the patient demonstrating mostly sinus tachycardia.  Skipped beats were associated with PVCs which were less than 0.25%.  Exercise intolerance with dyspnea.  No edema.  No nocturnal dyspnea.  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  The remote episodes are distinct from recurrent events.  Notably, she was also referred for these remote episodes to [facility] for ambulatory and inpatient EEG monitoring which was all negative.  Diet is replete of fluid  Past Medical History:  Diagnosis Date  . Anxiety   . Asthma    sports induced  . Gestational hypertension   . Headache   . Hidradenitis   . HSV (herpes simplex virus) anogenital infection   . Infection    UTI-frequent  . Seizures (HCC)    unknown cause, last at age 4  . Short PR-normal QRS complex  syndrome   . Syncope    neurally-mediated  . Vitamin D deficiency       Surgical History:  Past Surgical History:  Procedure Laterality Date  . TONSILLECTOMY    . WISDOM TOOTH EXTRACTION       Home Meds: Current Meds  Medication Sig  . acetaminophen (TYLENOL) 500 MG tablet Take 1,000 mg by mouth every 6 (six) hours as needed for moderate pain or headache.  . cyclobenzaprine (FLEXERIL) 10 MG tablet Take 1 tablet (10 mg total) by mouth 3 (three) times daily as needed for muscle spasms.  . Levonorgestrel (LILETTA, 52 MG,) 19.5 MCG/DAY IUD IUD Liletta 19.5 mcg/24 hrs (5 yrs) 52 mg intrauterine device  Take 1 device by intrauterine route.  . sertraline (ZOLOFT) 100 MG tablet Take 100 mg by mouth daily.    Allergies: No Known Allergies  Social History   Socioeconomic History  . Marital status: Married    Spouse name: Not on file  . Number of children: Not on file  . Years of education: Not on file  . Highest education level: Not on file  Occupational History  . Not on file  Social Needs  . Financial resource strain: Not on file  . Food insecurity:    Worry: Not on file    Inability: Not on file  . Transportation needs:    Medical: Not on file    Non-medical: Not on file  Tobacco Use  . Smoking status: Current Every Day Smoker    Packs/day: 0.50    Years: 8.00    Pack years: 4.00  . Smokeless tobacco: Never Used  Substance and Sexual Activity  . Alcohol use: No  . Drug use: No  . Sexual activity: Yes    Partners: Male    Birth control/protection: None  Lifestyle  . Physical activity:    Days per week: Not on file    Minutes per session: Not on file  . Stress: Not on file  Relationships  . Social connections:    Talks on phone: Not on file    Gets together: Not on file    Attends religious service: Not on file    Active member of club or organization: Not on file    Attends meetings of clubs or organizations: Not on file    Relationship status: Not on file    . Intimate partner violence:    Fear of current or ex partner: Not on file    Emotionally abused: Not on file    Physically abused: Not on file    Forced sexual activity: Not on file  Other Topics Concern  . Not on file  Social History Narrative  . Not on file     Family History  Problem Relation Age of Onset  . Hypertension Father   . Cancer Maternal Aunt        ovarian  . Diabetes Maternal Grandmother   . Heart disease Maternal Grandmother   . Hypertension Maternal Grandmother   . Heart disease Maternal Grandfather   . Diabetes Paternal Grandmother   . Heart disease Paternal Grandmother   . Cancer Paternal Grandfather        lung  . Hypertension Mother   . Heart disease Mother        2 heart attack  . Hyperlipidemia Mother      ROS:  Please see the history of present illness.     All other systems reviewed and negative.    Physical Exam: Blood pressure 120/77, pulse 75, height 5\' 7"  (1.702 m), weight 187 lb 9.6 oz (85.1 kg), SpO2 98 %, unknown if currently breastfeeding. General: Well developed, well nourished female in no acute distress. Head: Normocephalic, atraumatic, sclera non-icteric, no xanthomas, nares are without discharge. EENT: normal  Lymph Nodes:  none Neck: Negative for carotid bruits. JVD not elevated. Back:without scoliosis kyphosis  Lungs: Clear bilaterally to auscultation without wheezes, rales, or rhonchi. Breathing is unlabored. Heart: RRR with S1 S2. No  murmur . No rubs, or gallops appreciated. Abdomen: Soft, non-tender, non-distended with normoactive bowel sounds. No hepatomegaly. No rebound/guarding. No obvious abdominal masses. Msk:  Strength and tone appear normal for age. Extremities: No clubbing or cyanosis. No edema.  Distal pedal pulses are 2+ and equal bilaterally. Skin: Warm and Dry Neuro: Alert and oriented X 3. CN III-XII intact Grossly normal sensory and motor function . Psych:  Responds to questions appropriately with a normal  affect.      Labs: Cardiac Enzymes No results for input(s): CKTOTAL, CKMB, TROPONINI in the last 72 hours. CBC Lab Results  Component Value Date   WBC 10.4 (A) 01/16/2018   HGB 13.8 (A) 01/16/2018   HCT 41.2 (A) 01/16/2018   MCV 88.5 01/16/2018   PLT 196 12/06/2016   PROTIME: No results for input(s): LABPROT, INR in the last 72 hours. Chemistry No results for input(s): NA, K, CL, CO2, BUN, CREATININE, CALCIUM, PROT, BILITOT,  ALKPHOS, ALT, AST, GLUCOSE in the last 168 hours.  Invalid input(s): LABALBU Lipids No results found for: CHOL, HDL, LDLCALC, TRIG BNP No results found for: PROBNP Thyroid Function Tests: No results for input(s): TSH, T4TOTAL, T3FREE, THYROIDAB in the last 72 hours.  Invalid input(s): FREET3 Miscellaneous No results found for: DDIMER  Radiology/Studies:  No results found.  EKG: Sinus rhythm at 75 Intervals 01/13/1938 Short PR interval with rather peaked P waves.  No change from 2012   Assessment and Plan:  Dizziness with a vertiginous component  Neurally mediated syncope in the past  PVCs-rare  Fatigue  Palpitations associate with sinus tachycardia   She has some degree of orthostatic intolerance today with a change in heart rate of 25 bpm.  Sinus tachycardia on her event recorder would support dysautonomia as a contributing issue.  However, I am impressed at the vertiginous component of her spells, the lack of relief upon lying down and the aggravation of the spells by rotation of her head.  The fact that they are clinically quite distinct from her prior episodes also makes me reluctant to group the spells as the same and will reach out to PCP to consider referral to ENT/neurology for evaluation of vertigo.  I am not sure the mechanism of her fatigue.  Certainly can be associated with autonomic dysfunction.  However, not sure yet that that is the right diagnosis.  She has 2 small children.  I wonder if that may not be contributing  also.  Sertraline has been on board for about 1 year, taking for anxiety, Rx by Ob-Gyn   Sherryl Manges

## 2018-03-23 ENCOUNTER — Telehealth: Payer: Self-pay | Admitting: Family Medicine

## 2018-03-23 NOTE — Telephone Encounter (Signed)
Please advise on message below.  Thanks, [employer]

## 2018-03-23 NOTE — Telephone Encounter (Signed)
Staff message sent

## 2018-03-23 NOTE — Telephone Encounter (Signed)
Copied from CRM [id number]. Topic: General - Other >> Mar 22, 2018  6:24 PM Trula SladeWalter, Linda F wrote: Reason for CRM:  Dr. Berton MountSteve Klein w/Cardiology [phone] would like for Dr. Chilton SiGreen to call him tomorrow about the patient.

## 2018-04-02 ENCOUNTER — Other Ambulatory Visit: Payer: Self-pay | Admitting: Family Medicine

## 2018-04-25 ENCOUNTER — Encounter: Payer: Self-pay | Admitting: Family Medicine

## 2020-07-15 ENCOUNTER — Other Ambulatory Visit: Payer: Self-pay

## 2020-07-15 ENCOUNTER — Encounter: Payer: Self-pay | Admitting: Emergency Medicine

## 2020-07-15 ENCOUNTER — Ambulatory Visit
Admission: EM | Admit: 2020-07-15 | Discharge: 2020-07-15 | Disposition: A | Discharge status: Home / Self Care | Payer: Medicaid Other

## 2020-07-15 DIAGNOSIS — J029 Acute pharyngitis, unspecified: Secondary | ICD-10-CM | POA: Diagnosis not present

## 2020-07-15 MED ORDER — PREDNISONE 20 MG PO TABS: 20.0000 mg | ORAL_TABLET | Freq: Every day | ORAL | 0 refills | Status: AC

## 2020-07-15 NOTE — ED Provider Notes (Signed)
[facility]    CSN: [id number] Arrival date & time: 07/15/20  1044      History   Chief Complaint Chief Complaint  Patient presents with  . Sore Throat    HPI Crystal Newman is a 30 y.o. female.   HPI  Patient in with 2 weeks of sinus congestion, nasal drainage and sore throat which has been present for total of 10 days.  Patient is currently taking cefdinir and has taken a home COVID test which was negative. Sore throat with swallowing. Started Cefdinir course for HS x few days ago.  Past Medical History:  Diagnosis Date  . Anxiety   . Asthma    sports induced  . Gestational hypertension   . Headache   . Hidradenitis   . HSV (herpes simplex virus) anogenital infection   . Infection    UTI-frequent  . Seizures (HCC)    unknown cause, last at age 45  . Short PR-normal QRS complex syndrome   . Syncope    neurally-mediated  . Vitamin D deficiency     Patient Active Problem List   Diagnosis Date Noted  . Pregnancy induced hypertension, third trimester 12/05/2016  . Vitamin D deficiency 07/25/2016  . History of 5 spontaneous abortions 07/22/2016  . 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 Rubella non-immune status, antepartum 11/18/2014  . 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 Hidradenitis 07/21/2011    Past Surgical History:  Procedure Laterality Date  . TONSILLECTOMY    . WISDOM TOOTH EXTRACTION      OB History    Gravida  7   Para  2   Term  2   Preterm      AB  5   Living  2     SAB  5   IAB      Ectopic      Multiple  0   Live Births  2            Home Medications    Prior to Admission medications   Medication Sig Start Date End Date Taking? Authorizing Provider  acetaminophen (TYLENOL) 500 MG tablet Take 1,000 mg by mouth every 6 (six) hours as needed for moderate pain or headache.   Yes [provider]  levonorgestrel (LILETTA) 19.5 MCG/DAY IUD  IUD Liletta 19.5 mcg/24 hrs (5 yrs) 52 mg intrauterine device  Take 1 device by intrauterine route.   Yes [provider]  spironolactone (ALDACTONE) 100 MG tablet Take 2 tablets by mouth daily. 03/20/19  Yes [provider]  cyclobenzaprine (FLEXERIL) 10 MG tablet Take 1 tablet (10 mg total) by mouth 3 (three) times daily as needed for muscle spasms. 08/22/16   Constant, Peggy, MD  sertraline (ZOLOFT) 100 MG tablet Take 100 mg by mouth daily.    [provider]    Family History Family History  Problem Relation Age of Onset  . Hypertension Father   . Cancer Maternal Aunt        ovarian  . Diabetes Maternal Grandmother   . Heart disease Maternal Grandmother   . Hypertension Maternal Grandmother   . Heart disease Maternal Grandfather   . Diabetes Paternal Grandmother   . Heart disease Paternal Grandmother   . Cancer Paternal Grandfather        lung  . Hypertension Mother   . Heart disease Mother        2 heart attack  . Hyperlipidemia Mother  Social History Social History   Tobacco Use  . Smoking status: Current Every Day Smoker    Packs/day: 0.50    Years: 8.00    Pack years: 4.00  . Smokeless tobacco: Never Used  Vaping Use  . Vaping Use: Never used  Substance Use Topics  . Alcohol use: No  . Drug use: No     Allergies   Patient has no known allergies.   Review of Systems Review of Systems   Physical Exam Triage Vital Signs ED Triage Vitals  Enc Vitals Group     BP 07/15/20 1059 116/77     Pulse Rate 07/15/20 1059 89     Resp --      Temp 07/15/20 1059 98.8 F (37.1 C)     Temp Source 07/15/20 1059 Oral     SpO2 07/15/20 1059 97 %     Weight --      Height --      Head Circumference --      Peak Flow --      Pain Score 07/15/20 1101 5     Pain Loc --      Pain Edu? --      Excl. in GC? --    No data found.  Updated Vital Signs BP 116/77 (BP Location: Left Arm)   Pulse 89   Temp 98.8 F (37.1 C) (Oral)   SpO2  97%   Visual Acuity Right Eye Distance:   Left Eye Distance:   Bilateral Distance:    Right Eye Near:   Left Eye Near:    Bilateral Near:     Physical Exam  General Appearance:    Alert, cooperative, no distress  HENT:   Normocephalic, ears normal, nares with congestion, oropharynx  eythematous with edematous   Eyes:    PERRL, conjunctiva/corneas clear, EOM's intact       Lungs:     Clear to auscultation bilaterally, respirations unlabored  Heart:    Regular rate and rhythm  Neurologic:   Awake, alert, oriented x 3. No apparent focal neurological           defect.     UC Treatments / Results  Labs (all labs ordered are listed, but only abnormal results are displayed) Labs Reviewed - No data to display  EKG   Radiology No results found.  Procedures Procedures (including critical care time)  Medications Ordered in UC Medications - No data to display  Initial Impression / Assessment and Plan / UC Course  I have reviewed the triage vital signs and the nursing notes.  Pertinent labs & imaging results that were available during my care of the patient were reviewed by me and considered in my medical decision making (see chart for details).   Continue Cefdinir. Prednisone 20 mg x 5 days for oropharyngeal swelling. Negative home COVID test. PCP follow-up if symptoms worsen or do not improve. Final Clinical Impressions(s) / UC Diagnoses   Final diagnoses:  Acute pharyngitis, unspecified etiology   Discharge Instructions   None    ED Prescriptions    Medication Sig Dispense Auth. Provider   predniSONE (DELTASONE) 20 MG tablet Take 1 tablet (20 mg total) by mouth daily with breakfast for 5 days. 5 tablet Bing Neighbors, FNP     PDMP not reviewed this encounter.   Bing Neighbors, FNP 07/21/20 [id number]

## 2020-07-15 NOTE — ED Triage Notes (Signed)
Patient c/o sore throat x 10 days, headache, pressure in head which has since resolved.  Post nasal drip when she bends her head over.  Patient did have a fever last week x 2 day, since resolved.  Patient is taking Cefdinir.  Patient is not vaccinated.  Negative COVID at home test.

## 2020-07-26 ENCOUNTER — Ambulatory Visit
Admission: EM | Admit: 2020-07-26 | Discharge: 2020-07-26 | Disposition: A | Discharge status: Home / Self Care | Payer: Medicaid Other | Attending: Nurse Practitioner | Admitting: Nurse Practitioner

## 2020-07-26 ENCOUNTER — Other Ambulatory Visit: Payer: Self-pay

## 2020-07-26 ENCOUNTER — Encounter: Payer: Self-pay | Admitting: Emergency Medicine

## 2020-07-26 DIAGNOSIS — J029 Acute pharyngitis, unspecified: Secondary | ICD-10-CM | POA: Diagnosis present

## 2020-07-26 LAB — POCT RAPID STREP A (OFFICE): Rapid Strep A Screen: NEGATIVE

## 2020-07-26 MED ORDER — METHYLPREDNISOLONE 4 MG PO TBPK: ORAL_TABLET | ORAL | 0 refills | Status: DC

## 2020-07-26 MED ORDER — AMOXICILLIN-POT CLAVULANATE 875-125 MG PO TABS: 1.0000 | ORAL_TABLET | Freq: Two times a day (BID) | ORAL | 0 refills | Status: DC

## 2020-07-26 NOTE — Discharge Instructions (Signed)
Take medications as prescribed until they are finished Warm salt water gargles at least 3 times a day Change toothbrush in a couple of days Follow-up with ENT if no improvement or recurrence of symptoms after finishing the medications

## 2020-07-26 NOTE — ED Provider Notes (Signed)
[facility]    CSN: [id number] Arrival date & time: 07/26/20  1110      History   Chief Complaint Chief Complaint  Patient presents with  . Sore Throat    HPI Crystal Newman is a 30 y.o. female.   Subjective:   Crystal Newman is a 30 y.o. female who presents for evaluation of a sore throat.  Onset of symptoms was about 3 weeks ago.  At that time, patient also had sinus congestion, pressure and drainage.  She thought that she had a sinus infection.  She had a prescription for cefdinir at home.  She took the entire 7-day course of cefdinir with improvement in her symptoms but the sore throat remained.  She was evaluated here on 07/15/2020 with complaints of a continued sore throat.  Home COVID test was negative.  She was placed on steroids with minimal improvement in symptoms.  Patient reports that sore throat worsened over the past couple of days.  Patient represents today for evaluation.  She has had a tonsillectomy several years ago.  Currently, patient denies any associated symptoms; specifically, no fevers, congestion, runny nose, drainage, sinus pressure, ear pain, cough, nausea, vomiting, diarrhea, headache or dizziness. She is drinking plenty of fluids. She has not had recent close exposure to someone with proven streptococcal pharyngitis.  The following portions of the patient's history were reviewed and updated as appropriate: allergies, current medications, past family history, past medical history, past social history, past surgical history and problem list.        Past Medical History:  Diagnosis Date  . Anxiety   . Asthma    sports induced  . Gestational hypertension   . Headache   . Hidradenitis   . HSV (herpes simplex virus) anogenital infection   . Infection    UTI-frequent  . Seizures (HCC)    unknown cause, last at age 19  . Short PR-normal QRS complex syndrome   . Syncope    neurally-mediated  . Vitamin D deficiency     Patient Active  Problem List   Diagnosis Date Noted  . Pregnancy induced hypertension, third trimester 12/05/2016  . Vitamin D deficiency 07/25/2016  . History of 5 spontaneous abortions 07/22/2016  . 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 Rubella non-immune status, antepartum 11/18/2014  . 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 Hidradenitis 07/21/2011    Past Surgical History:  Procedure Laterality Date  . TONSILLECTOMY    . WISDOM TOOTH EXTRACTION      OB History    Gravida  7   Para  2   Term  2   Preterm      AB  5   Living  2     SAB  5   IAB      Ectopic      Multiple  0   Live Births  2            Home Medications    Prior to Admission medications   Medication Sig Start Date End Date Taking? Authorizing Provider  acetaminophen (TYLENOL) 500 MG tablet Take 1,000 mg by mouth every 6 (six) hours as needed for moderate pain or headache.   Yes [provider]  amoxicillin-clavulanate (AUGMENTIN) 875-125 MG tablet Take 1 tablet by mouth every 12 (twelve) hours. 07/26/20  Yes Lurline Idol, FNP  levonorgestrel (LILETTA) 19.5 MCG/DAY IUD IUD Liletta 19.5 mcg/24 hrs (5 yrs) 52 mg intrauterine device  Take 1 device by intrauterine route.   Yes [provider]  methylPREDNISolone (MEDROL DOSEPAK) 4 MG TBPK tablet Take as directed 07/26/20  Yes Lurline Idol, FNP  spironolactone (ALDACTONE) 100 MG tablet Take 2 tablets by mouth daily. 03/20/19  Yes [provider]  cyclobenzaprine (FLEXERIL) 10 MG tablet Take 1 tablet (10 mg total) by mouth 3 (three) times daily as needed for muscle spasms. 08/22/16   Constant, Peggy, MD  sertraline (ZOLOFT) 100 MG tablet Take 100 mg by mouth daily.    [provider]    Family History Family History  Problem Relation Age of Onset  . Hypertension Father   . Cancer Maternal Aunt        ovarian  . Diabetes Maternal Grandmother   . Heart disease  Maternal Grandmother   . Hypertension Maternal Grandmother   . Heart disease Maternal Grandfather   . Diabetes Paternal Grandmother   . Heart disease Paternal Grandmother   . Cancer Paternal Grandfather        lung  . Hypertension Mother   . Heart disease Mother        2 heart attack  . Hyperlipidemia Mother     Social History Social History   Tobacco Use  . Smoking status: Current Every Day Smoker    Packs/day: 0.50    Years: 8.00    Pack years: 4.00  . Smokeless tobacco: Never Used  Vaping Use  . Vaping Use: Never used  Substance Use Topics  . Alcohol use: No  . Drug use: No     Allergies   Patient has no known allergies.   Review of Systems Review of Systems  Constitutional: Negative for fever.  HENT: Positive for sore throat and trouble swallowing. Negative for congestion, ear pain, postnasal drip, rhinorrhea, sinus pressure and sinus pain.   Eyes: Negative.   Respiratory: Negative.   Gastrointestinal: Negative.   Neurological: Negative.   All other systems reviewed and are negative.    Physical Exam Triage Vital Signs ED Triage Vitals  Enc Vitals Group     BP 07/26/20 1228 108/76     Pulse Rate 07/26/20 1228 86     Resp --      Temp 07/26/20 1228 98.5 F (36.9 C)     Temp Source 07/26/20 1228 Oral     SpO2 07/26/20 1228 98 %     Weight --      Height --      Head Circumference --      Peak Flow --      Pain Score 07/26/20 1229 4     Pain Loc --      Pain Edu? --      Excl. in GC? --    No data found.  Updated Vital Signs BP 108/76 (BP Location: Left Arm)   Pulse 86   Temp 98.5 F (36.9 C) (Oral)   SpO2 98%   Visual Acuity Right Eye Distance:   Left Eye Distance:   Bilateral Distance:    Right Eye Near:   Left Eye Near:    Bilateral Near:     Physical Exam Vitals reviewed.  Constitutional:      General: She is not in acute distress.    Appearance: She is well-developed. She is not ill-appearing.  HENT:     Head:  Normocephalic.     Mouth/Throat:     Mouth: Mucous membranes are moist. No oral lesions.     Pharynx: Uvula midline.  Posterior oropharyngeal erythema present. No pharyngeal swelling, oropharyngeal exudate or uvula swelling.  Eyes:     Conjunctiva/sclera: Conjunctivae normal.  Cardiovascular:     Rate and Rhythm: Normal rate.  Pulmonary:     Effort: Pulmonary effort is normal.  Musculoskeletal:     Cervical back: Normal range of motion and neck supple.  Lymphadenopathy:     Cervical: No cervical adenopathy.  Skin:    General: Skin is warm and dry.  Neurological:     General: No focal deficit present.     Mental Status: She is alert and oriented to person, place, and time.  Psychiatric:        Mood and Affect: Mood normal.        Behavior: Behavior normal.      UC Treatments / Results  Labs (all labs ordered are listed, but only abnormal results are displayed) Labs Reviewed  CULTURE, GROUP A STREP [facility])  POCT RAPID STREP A (OFFICE)    EKG   Radiology No results found.  Procedures Procedures (including critical care time)  Medications Ordered in UC Medications - No data to display  Initial Impression / Assessment and Plan / UC Course  I have reviewed the triage vital signs and the nursing notes.  Pertinent labs & imaging results that were available during my care of the patient were reviewed by me and considered in my medical decision making (see chart for details).    30 year old female presenting with persistent sore throat for the past 3 weeks.  She has finished a course of antibiotics and steroids without any improvement in symptoms.  She has had a tonsillectomy several years ago.  Rapid strep negative.  Throat cultures pending.  Will repeat a course of antibiotics and steroids.  Supportive care.  Follow-up with ENT if no improvement or recurrence of symptoms.  Today's evaluation has revealed no signs of a dangerous process. Discussed diagnosis with patient  and/or guardian. Patient and/or guardian aware of their diagnosis, possible red flag symptoms to watch out for and need for close follow up. Patient and/or guardian understands verbal and written discharge instructions. Patient and/or guardian comfortable with plan and disposition.  Patient and/or guardian has a clear mental status at this time, good insight into illness (after discussion and teaching) and has clear judgment to make decisions regarding their care  This care was provided during an unprecedented National Emergency due to the Novel Coronavirus (COVID-19) pandemic. COVID-19 infections and transmission risks place heavy strains on healthcare resources.  As this pandemic evolves, our facility, providers, and staff strive to respond fluidly, to remain operational, and to provide care relative to available resources and information. Outcomes are unpredictable and treatments are without well-defined guidelines. Further, the impact of COVID-19 on all aspects of urgent care, including the impact to patients seeking care for reasons other than COVID-19, is unavoidable during this national emergency. At this time of the global pandemic, management of patients has significantly changed, even for non-COVID positive patients given high local and regional COVID volumes at this time requiring high healthcare system and resource utilization. The standard of care for management of both COVID suspected and non-COVID suspected patients continues to change rapidly at the local, regional, national, and global levels. This patient was worked up and treated to the best available but ever changing evidence and resources available at this current time.   Documentation was completed with the aid of voice recognition software. Transcription may contain typographical errors. Final Clinical Impressions(s) / UC  Diagnoses   Final diagnoses:  Acute pharyngitis, unspecified etiology     Discharge Instructions     . Take  medications as prescribed until they are finished . Warm salt water gargles at least 3 times a day . Change toothbrush in a couple of days . Follow-up with ENT if no improvement or recurrence of symptoms after finishing the medications     ED Prescriptions    Medication Sig Dispense Auth. Provider   amoxicillin-clavulanate (AUGMENTIN) 875-125 MG tablet Take 1 tablet by mouth every 12 (twelve) hours. 14 tablet Tamya Denardo, FNP   methyLurline IdollPREDNISolone (MEDROL DOSEPAK) 4 MG TBPK tablet Take as directed 21 tablet Lurline IdolMurrill, Quaneisha Hanisch, FNP     PDMP not reviewed this encounter.   Lurline IdolMurrill, Jousha Schwandt, [place] 07/26/20 1357

## 2020-07-26 NOTE — ED Triage Notes (Signed)
Patient c/o sore throat x 3 days, under the tongue feels like it's swollen.  Patient has taken Tylenol.  Just finished the antibiotics and steroids.

## 2020-07-28 LAB — CULTURE, GROUP A STREP (THRC)

## 2020-07-29 LAB — CULTURE, GROUP A STREP (THRC)

## 2020-11-04 ENCOUNTER — Ambulatory Visit: Payer: Medicaid Other | Admitting: Family Medicine

## 2020-11-04 ENCOUNTER — Other Ambulatory Visit: Payer: Self-pay

## 2020-11-04 VITALS — BP 128/76 | HR 66 | Temp 98.2°F | Resp 16 | Ht 67.0 in | Wt 169.0 lb

## 2020-11-04 DIAGNOSIS — R233 Spontaneous ecchymoses: Secondary | ICD-10-CM

## 2020-11-04 DIAGNOSIS — R1012 Left upper quadrant pain: Secondary | ICD-10-CM | POA: Diagnosis not present

## 2020-11-04 DIAGNOSIS — R0781 Pleurodynia: Secondary | ICD-10-CM | POA: Diagnosis not present

## 2020-11-04 DIAGNOSIS — R238 Other skin changes: Secondary | ICD-10-CM | POA: Diagnosis not present

## 2020-11-04 NOTE — Patient Instructions (Signed)
Small bump below the skin may be a very small lipoma, see information below.  This sometimes can become painful if repetitive pressing on area, so try to avoid pressing or pushing on that area if possible.  I will check an x-ray of the ribs, as well is ordered an ultrasound for the soreness in the upper abdomen.  Blood count to look at platelets and bruising.  Please have labs and x-ray done at the [facility] office.   If you notice worsening of easy bruising or bleeding, be seen right away.  If no source for the discomfort found on testing above, can look into other options.  Follow-up with me in the next few weeks depending on results of above studies.  Let me know if there are questions sooner and take care.  Lipoma A lipoma is a noncancerous (benign) tumor that is made up of fat cells. This is a very common type of soft-tissue growth. Lipomas are usually found under the skin (subcutaneous). They may occur in any tissue of the body that contains fat. Common areas for lipomas to appear include the back, arms, shoulders, buttocks, and thighs. Lipomas grow slowly, and they are usually painless. Most lipomas do not cause problems and do not require treatment. What are the causes? The cause of this condition is not known. What increases the risk? You are more likely to develop this condition if: You are 81-28 years old. You have a family history of lipomas. What are the signs or symptoms? A lipoma usually appears as a small, round bump under the skin. In most cases, the lump will: Feel soft or rubbery. Not cause pain or other symptoms. However, if a lipoma is located in an area where it pushes on nerves, it can become painful or cause other symptoms. How is this diagnosed? A lipoma can usually be diagnosed with a physical exam. You may also have tests to confirm the diagnosis and to rule out other conditions. Tests may include: Imaging tests, such as a CT scan or an MRI. Removal of a tissue  sample to be looked at under a microscope (biopsy). How is this treated? Treatment for this condition depends on the size of the lipoma and whether it is causing any symptoms. For small lipomas that are not causing problems, no treatment is needed. If a lipoma is bigger or it causes problems, surgery may be done to remove the lipoma. Lipomas can also be removed to improve appearance. Most often, the procedure is done after applying a medicine that numbs the area (local anesthetic). Liposuction may be done to reduce the size of the lipoma before it is removed through surgery, or it may be done to remove the lipoma. Lipomas are removed with this method in order to limit incision size and scarring. A liposuction tube is inserted through a small incision into the lipoma, and the contents of the lipoma are removed through the tube with suction. Follow these instructions at home: Watch your lipoma for any changes. Keep all follow-up visits as told by your health care provider. This is important. Contact a health care provider if: Your lipoma becomes larger or hard. Your lipoma becomes painful, red, or increasingly swollen. These could be signs of infection or a more serious condition. Get help right away if: You develop tingling or numbness in an area near the lipoma. This could indicate that the lipoma is causing nerve damage. Summary A lipoma is a noncancerous tumor that is made up of fat  cells. Most lipomas do not cause problems and do not require treatment. If a lipoma is bigger or it causes problems, surgery may be done to remove the lipoma. Contact a health care provider if your lipoma becomes larger or hard, or if it becomes painful, red, or increasingly swollen. Pain, redness, and swelling could be signs of infection or a more serious condition. This information is not intended to replace advice given to you by your health care provider. Make sure you discuss any questions you have with your  health care provider. Document Revised: 10/08/2018 Document Reviewed: 10/08/2018 Elsevier Patient Education  2022 [employer] Chest Wall Pain Chest wall pain is pain in or around the bones and muscles of your chest. Sometimes, an injury causes this pain. Excessive coughing or overuse of arm and chest muscles may also cause chest wall pain. Sometimes, the cause may not be known. This pain may take several weeks or longer to get better. Follow these instructions at home: Managing pain, stiffness, and swelling  If directed, put ice on the painful area: Put ice in a plastic bag. Place a towel between your skin and the bag. Leave the ice on for 20 minutes, 2-3 times per day. Activity Rest as told by your health care provider. Avoid activities that cause pain. These include any activities that use your chest muscles or your abdominal and side muscles to lift heavy items. Ask your health care provider what activities are safe for you. General instructions  Take over-the-counter and prescription medicines only as told by your health care provider. Do not use any products that contain nicotine or tobacco, such as cigarettes, e-cigarettes, and chewing tobacco. These can delay healing after injury. If you need help quitting, ask your health care provider. Keep all follow-up visits as told by your health care provider. This is important. Contact a health care provider if: You have a fever. Your chest pain becomes worse. You have new symptoms. Get help right away if: You have nausea or vomiting. You feel sweaty or light-headed. You have a cough with mucus from your lungs (sputum) or you cough up blood. You develop shortness of breath. These symptoms may represent a serious problem that is an emergency. Do not wait to see if the symptoms will go away. Get medical help right away. Call your local emergency services (911 in the [place] Do not drive yourself to the hospital. Summary Chest wall pain  is pain in or around the bones and muscles of your chest. Depending on the cause, it may be treated with ice, rest, medicines, and avoiding activities that cause pain. Contact a health care provider if you have a fever, worsening chest pain, or new symptoms. Get help right away if you feel light-headed or you develop shortness of breath. These symptoms may be an emergency. This information is not intended to replace advice given to you by your health care provider. Make sure you discuss any questions you have with your health care provider. Document Revised: 05/08/2020 Document Reviewed: 05/08/2020 Elsevier Patient Education  2022 [employer].

## 2020-11-04 NOTE — Progress Notes (Signed)
Subjective:  Patient ID: Crystal Newman, female    DOB: January 15, 1991  Age: 30 y.o. MRN: [id number]  CC:  Chief Complaint  Patient presents with   Mass    Pt notes several years has had lump on her Lt side on her ribs, pt notes it is getting larger over that last couple months, certain sitting positions are uncomfortable, notes some pressure in that area.     HPI Crystal Newman presents for   Lump on ribs: Front lower left ribs.  Present for years. No known injury, no known prior rib fracture.  Eval at ER in 2012 for similar issue - present for years, rib series negative.  Feels like getting bigger. Now sore at times when not bothering her before.  More sore past 2 weeks. Sore with deep breath. Worse with leaning forward slouch. 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 Prior stay at home. Less active now.  No fevers.  No unexplained wt loss or night sweats.  No cough.  notes some bruises at times - past week. Foot, lower leg, thigh - NKI.  No hematuria, no bleeding gums. No personal or FH of bleeding disorders.  No n/v/abd pain or blood in stool. (But sore in left side below ribs) Eating and drinking normally.  No aspirin.     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   Rubella non-immune status, antepartum 11/18/2014   Previous recurrent miscarriages affecting pregnancy, antepartum 11/18/2014   Hidradenitis 07/21/2011   Past Medical History:  Diagnosis Date   Anxiety    Asthma    sports induced   Gestational hypertension    Headache    Hidradenitis    HSV (herpes simplex virus) anogenital infection    Infection    UTI-frequent   Seizures (HCC)    unknown cause, last at age 50   Short PR-normal QRS complex syndrome    Syncope     neurally-mediated   Vitamin D deficiency    Past Surgical History:  Procedure Laterality Date   TONSILLECTOMY     WISDOM TOOTH EXTRACTION     No Known Allergies Prior to Admission medications   Medication Sig Start Date End Date Taking? Authorizing Provider  acetaminophen (TYLENOL) 500 MG tablet Take 1,000 mg by mouth every 6 (six) hours as needed for moderate pain or headache.   Yes [provider]  levonorgestrel (LILETTA) 19.5 MCG/DAY IUD IUD Liletta 19.5 mcg/24 hrs (5 yrs) 52 mg intrauterine device  Take 1 device by intrauterine route.   Yes [provider]  amoxicillin-clavulanate (AUGMENTIN) 875-125 MG tablet Take 1 tablet by mouth every 12 (twelve) hours. 07/26/20   Lurline Idol, FNP  cyclobenzaprine (FLEXERIL) 10 MG tablet Take 1 tablet (10 mg total) by mouth 3 (three) times daily as needed for muscle spasms. 08/22/16   Constant, Peggy, MD  methylPREDNISolone (MEDROL DOSEPAK) 4 MG TBPK tablet Take as directed 07/26/20   Lurline Idol, FNP  sertraline (ZOLOFT) 100 MG tablet Take 100 mg by mouth daily.    [provider]  spironolactone (ALDACTONE) 100 MG tablet Take 2 tablets by mouth daily. 03/20/19   [provider]   Social History   Socioeconomic History   Marital status: Married    Spouse name:  Not on file   Number of children: Not on file   Years of education: Not on file   Highest education level: Not on file  Occupational History   Not on file  Tobacco Use   Smoking status: Every Day    Packs/day: 0.50    Years: 8.00    Pack years: 4.00    Types: Cigarettes   Smokeless tobacco: Never  Vaping Use   Vaping Use: Never used  Substance and Sexual Activity   Alcohol use: No   Drug use: No   Sexual activity: Yes    Partners: Male    Birth control/protection: None  Other Topics Concern   Not on file  Social History Narrative   Not on file   Social Determinants of Health   Financial Resource Strain: Not on file  Food  Insecurity: Not on file  Transportation Needs: Not on file  Physical Activity: Not on file  Stress: Not on file  Social Connections: Not on file  Intimate Partner Violence: Not on file    Review of Systems Per HPI.   Objective:   Vitals:   11/04/20 1552  BP: 128/76  Pulse: 66  Resp: 16  Temp: 98.2 F (36.8 C)  TempSrc: Temporal  SpO2: 99%  Weight: 169 lb (76.7 kg)  Height:  (1.702 m)     Physical Exam Vitals reviewed. Exam conducted with a chaperone present.  Constitutional:      General: She is not in acute distress.    Appearance: Normal appearance. She is well-developed.  HENT:     Head: Normocephalic and atraumatic.  Cardiovascular:     Rate and Rhythm: Normal rate and regular rhythm.     Pulses: Normal pulses.  Pulmonary:     Effort: Pulmonary effort is normal. No respiratory distress.     Breath sounds: Normal breath sounds.     Comments: Lungs clear, small mobile area below the skin at the left lower rib margin, midclavicular line.  Slight fullness, soreness over the left lower rib margin in addition to this mobile area.  Skin intact, no induration or erythema.  No rash. Abdominal:     General: There is no distension.     Palpations: There is no mass.     Tenderness: There is abdominal tenderness (Tender palpation left upper quadrant without rebound or guarding, no apparent hepatosplenomegaly.).  Skin:    General: Skin is warm and dry.     Findings: No rash.     Comments: Small approximately 1 cm healing bruise behind the right thigh, and left lower leg.  No apparent petechiae or purpura.  Neurological:     Mental Status: She is alert and oriented to person, place, and time.  Psychiatric:        Mood and Affect: Mood normal.       Assessment & Plan:  Crystal Newman is a 30 y.o. female . Rib pain on left side - Plan: DG Ribs Unilateral W/Chest Left  LUQ abdominal pain - Plan: US Abdomen Complete  Easy bruising - Plan: CBC  Longstanding  symptoms on her left lower rib area, possible component of costochondritis based on location, previous imaging and evaluation years ago was reassuring.  Small mobile area may be a very small lipoma, which may be painful from repeated palpation.  However does have some left upper quadrant discomfort as well, soreness in multiple areas.  Will check imaging with ultrasound at, rib series for left chest wall symptoms,  then follow-up to be determined by results and how she is feeling.  We will also check CBC with history of easy bruising.  Ultrasound of abdomen to evaluate spleen size as well.  RTC precautions.  No orders of the defined types were placed in this encounter.  Patient Instructions  Small bump below the skin may be a very small lipoma, see information below.  This sometimes can become painful if repetitive pressing on area, so try to avoid pressing or pushing on that area if possible.  I will check an x-ray of the ribs, as well is ordered an ultrasound for the soreness in the upper abdomen.  Blood count to look at platelets and bruising.  [facility]   If you notice worsening of easy bruising or bleeding, be seen right away.  If no source for the discomfort found on testing above, can look into other options.  Follow-up with me in the next few weeks depending on results of above studies.  Let me know if there are questions sooner and take care.  Lipoma A lipoma is a noncancerous (benign) tumor that is made up of fat cells. This is a very common type of soft-tissue growth. Lipomas are usually found under the skin (subcutaneous). They may occur in any tissue of the body that contains fat. Common areas for lipomas to appear include the back, arms, shoulders, buttocks, and thighs. Lipomas grow slowly, and they are usually painless. Most lipomas do not cause problems and do not require treatment. What are the causes? The cause of this condition is not  known. What increases the risk? You are more likely to develop this condition if: You are 3840-494 years old. You have a family history of lipomas. What are the signs or symptoms? A lipoma usually appears as a small, round bump under the skin. In most cases, the lump will: Feel soft or rubbery. Not cause pain or other symptoms. However, if a lipoma is located in an area where it pushes on nerves, it can become painful or cause other symptoms. How is this diagnosed? A lipoma can usually be diagnosed with a physical exam. You may also have tests to confirm the diagnosis and to rule out other conditions. Tests may include: Imaging tests, such as a CT scan or an MRI. Removal of a tissue sample to be looked at under a microscope (biopsy). How is this treated? Treatment for this condition depends on the size of the lipoma and whether it is causing any symptoms. For small lipomas that are not causing problems, no treatment is needed. If a lipoma is bigger or it causes problems, surgery may be done to remove the lipoma. Lipomas can also be removed to improve appearance. Most often, the procedure is done after applying a medicine that numbs the area (local anesthetic). Liposuction may be done to reduce the size of the lipoma before it is removed through surgery, or it may be done to remove the lipoma. Lipomas are removed with this method in order to limit incision size and scarring. A liposuction tube is inserted through a small incision into the lipoma, and the contents of the lipoma are removed through the tube with suction. Follow these instructions at home: Watch your lipoma for any changes. Keep all follow-up visits as told by your health care provider. This is important. Contact a health care provider if: Your lipoma becomes larger or hard. Your lipoma becomes painful, red, or  increasingly swollen. These could be signs of infection or a more serious condition. Get help right away if: You develop  tingling or numbness in an area near the lipoma. This could indicate that the lipoma is causing nerve damage. Summary A lipoma is a noncancerous tumor that is made up of fat cells. Most lipomas do not cause problems and do not require treatment. If a lipoma is bigger or it causes problems, surgery may be done to remove the lipoma. Contact a health care provider if your lipoma becomes larger or hard, or if it becomes painful, red, or increasingly swollen. Pain, redness, and swelling could be signs of infection or a more serious condition. This information is not intended to replace advice given to you by your health care provider. Make sure you discuss any questions you have with your health care provider. Document Revised: 10/08/2018 Document Reviewed: 10/08/2018 Elsevier Patient Education  2022 Elsevier Inc. Chest Wall Pain Chest wall pain is pain in or around the bones and muscles of your chest. Sometimes, an injury causes this pain. Excessive coughing or overuse of arm and chest muscles may also cause chest wall pain. Sometimes, the cause may not be known. This pain may take several weeks or longer to get better. Follow these instructions at home: Managing pain, stiffness, and swelling  If directed, put ice on the painful area: Put ice in a plastic bag. Place a towel between your skin and the bag. Leave the ice on for 20 minutes, 2-3 times per day. Activity Rest as told by your health care provider. Avoid activities that cause pain. These include any activities that use your chest muscles or your abdominal and side muscles to lift heavy items. Ask your health care provider what activities are safe for you. General instructions  Take over-the-counter and prescription medicines only as told by your health care provider. Do not use any products that contain nicotine or tobacco, such as cigarettes, e-cigarettes, and chewing tobacco. These can delay healing after injury. If you need help  quitting, ask your health care provider. Keep all follow-up visits as told by your health care provider. This is important. Contact a health care provider if: You have a fever. Your chest pain becomes worse. You have new symptoms. Get help right away if: You have nausea or vomiting. You feel sweaty or light-headed. You have a cough with mucus from your lungs (sputum) or you cough up blood. You develop shortness of breath. These symptoms may represent a serious problem that is an emergency. Do not wait to see if the symptoms will go away. Get medical help right away. Call your local emergency services (911 in the [place] Do not drive yourself to the hospital. Summary Chest wall pain is pain in or around the bones and muscles of your chest. Depending on the cause, it may be treated with ice, rest, medicines, and avoiding activities that cause pain. Contact a health care provider if you have a fever, worsening chest pain, or new symptoms. Get help right away if you feel light-headed or you develop shortness of breath. These symptoms may be an emergency. This information is not intended to replace advice given to you by your health care provider. Make sure you discuss any questions you have with your health care provider. Document Revised: 05/08/2020 Document Reviewed: 05/08/2020 Elsevier Patient Education  2022 [employer].        Signed,   Meredith Staggers, MD [facility], [facility]  11/06/20 11:11 AM

## 2020-11-06 ENCOUNTER — Encounter: Payer: Self-pay | Admitting: Family Medicine

## 2020-11-13 ENCOUNTER — Other Ambulatory Visit: Payer: Self-pay

## 2020-11-13 ENCOUNTER — Ambulatory Visit (INDEPENDENT_AMBULATORY_CARE_PROVIDER_SITE_OTHER)
Admission: RE | Admit: 2020-11-13 | Discharge: 2020-11-13 | Disposition: A | Discharge status: Home / Self Care | Payer: Medicaid Other | Source: Ambulatory Visit | Attending: Family Medicine | Admitting: Family Medicine

## 2020-11-13 ENCOUNTER — Other Ambulatory Visit (INDEPENDENT_AMBULATORY_CARE_PROVIDER_SITE_OTHER): Payer: Medicaid Other

## 2020-11-13 DIAGNOSIS — R0781 Pleurodynia: Secondary | ICD-10-CM | POA: Diagnosis not present

## 2020-11-13 DIAGNOSIS — R233 Spontaneous ecchymoses: Secondary | ICD-10-CM

## 2020-11-13 DIAGNOSIS — R238 Other skin changes: Secondary | ICD-10-CM | POA: Diagnosis not present

## 2020-11-13 LAB — CBC
HCT: 40 % (ref 36.0–46.0)
Hemoglobin: 13.3 g/dL (ref 12.0–15.0)
MCHC: 33.2 g/dL (ref 30.0–36.0)
MCV: 92.6 fl (ref 78.0–100.0)
Platelets: 225 K/uL (ref 150.0–400.0)
RBC: 4.32 Mil/uL (ref 3.87–5.11)
RDW: 12.9 % (ref 11.5–15.5)
WBC: 10 K/uL (ref 4.0–10.5)

## 2020-11-16 ENCOUNTER — Ambulatory Visit
Admission: RE | Admit: 2020-11-16 | Discharge: 2020-11-16 | Disposition: A | Discharge status: Home / Self Care | Payer: Medicaid Other | Source: Ambulatory Visit | Attending: Family Medicine | Admitting: Family Medicine

## 2020-11-16 ENCOUNTER — Other Ambulatory Visit: Payer: Self-pay | Admitting: Family Medicine

## 2020-11-16 DIAGNOSIS — R1012 Left upper quadrant pain: Secondary | ICD-10-CM

## 2021-04-06 ENCOUNTER — Encounter: Payer: Self-pay | Admitting: Registered Nurse

## 2021-04-06 ENCOUNTER — Ambulatory Visit: Payer: BC Managed Care – PPO | Admitting: Registered Nurse

## 2021-04-06 ENCOUNTER — Other Ambulatory Visit: Payer: Self-pay

## 2021-04-06 VITALS — BP 114/78 | HR 75 | Temp 98.4°F | Resp 17 | Ht 67.0 in | Wt 174.6 lb

## 2021-04-06 DIAGNOSIS — R519 Headache, unspecified: Secondary | ICD-10-CM

## 2021-04-06 DIAGNOSIS — R531 Weakness: Secondary | ICD-10-CM

## 2021-04-06 DIAGNOSIS — H539 Unspecified visual disturbance: Secondary | ICD-10-CM

## 2021-04-06 NOTE — Patient Instructions (Addendum)
Ms Crystal Newman to meet you  Call with new or worsening symptoms  Ok to take tylenol  Take steps to reduce stress - use relaxation techniques like hydration, warm bath, good sleep hygiene, and other relaxing activities.  Ok to use tylenol.  I'll be in touch with results  Thanks,  Rich     If you have lab work done today you will be contacted with your lab results within the next 2 weeks.  If you have not heard from [place] then please contact us. The fastest way to get your results is to register for My Chart.   IF you received an x-ray today, you will receive an invoice from [facility]. Please contact [facility] at [phone] with questions or concerns regarding your invoice.   IF you received labwork today, you will receive an invoice from [facility]. Please contact [facility] at [phone] with questions or concerns regarding your invoice.   Our billing staff will not be able to assist you with questions regarding bills from these companies.  You will be contacted with the lab results as soon as they are available. The fastest way to get your results is to activate your My Chart account. Instructions are located on the last page of this paperwork. If you have not heard from [place] regarding the results in 2 weeks, please contact this office.

## 2021-04-06 NOTE — Progress Notes (Signed)
Established Patient Office Visit  Subjective:  Patient ID: Crystal Newman, female    DOB: [birth year]  Age: 31 y.o. MRN: [id number]  CC:  Chief Complaint  Patient presents with   Eye Twitching    Patient states since yesterday her left eye was twitching , left eye was numb/tingling, headaches , and mouth watering and feel in a fog. Today no headache but she left side still feels weak    HPI Crystal Newman presents for L side weakness  Onset yesterday evening around 9pm with headache - sudden onset. Took tylenol, went to bed.  L eye had been twitching since around 5pm yesterday. Did not think anything of it originally, would come and go over the following few hours.  Noted "pins and needles" sensation in arm Noted mouth watering - no nausea or vomiting.  Episodes of feeling "dazed out" "in a fog".  Notes during episodes of symptoms, did have blurry vision in L eye. However, this would resolve between episodes and was too severe.   Into today, feeling like she has to concentrate more on speaking. Like she "has to think about talking"  Today feels left sided weakness, more tired, out of balance. L arm>L leg.  No incontinence, nvd, fevers, chills, sweats  Hx of short pr-normal qrs complex syndrome. Overdue for follow up with cardiology Dr. Graciela Husbands.   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, running a church, fundraising, full time job  Past Medical History:  Diagnosis Date   Anxiety    Asthma    sports induced   Gestational hypertension    Headache    Hidradenitis    HSV (herpes simplex virus) anogenital infection    Infection    UTI-frequent   Seizures (HCC)    unknown cause, last at age 47   Short PR-normal QRS complex syndrome    Syncope    neurally-mediated   Vitamin D deficiency     Past Surgical History:  Procedure Laterality Date   TONSILLECTOMY     WISDOM TOOTH EXTRACTION      Family History  Problem Relation Age of Onset   Hypertension Mother     Heart disease Mother        2 heart attack   Hyperlipidemia Mother    Hypertension Father    Diabetes Maternal Grandmother    Heart disease Maternal Grandmother    Hypertension Maternal Grandmother    Heart disease Maternal Grandfather    Heart attack Maternal Grandfather        multiple before age 36.   Diabetes Paternal Grandmother    Heart disease Paternal Grandmother    Cancer Paternal Grandfather        lung   Cancer Maternal Aunt        ovarian    Social History   Socioeconomic History   Marital status: Married    Spouse name: Not on file   Number of children: Not on file   Years of education: Not on file   Highest education level: Not on file  Occupational History   Not on file  Tobacco Use   Smoking status: Every Day    Packs/day: 0.50    Years: 8.00    Pack years: 4.00    Types: Cigarettes   Smokeless tobacco: Never  Vaping Use   Vaping Use: Never used  Substance and Sexual Activity   Alcohol use: No   Drug use: No   Sexual activity: Yes  Partners: Male    Birth control/protection: None  Other Topics Concern   Not on file  Social History Narrative   Not on file   Social Determinants of Health   Financial Resource Strain: Not on file  Food Insecurity: Not on file  Transportation Needs: Not on file  Physical Activity: Not on file  Stress: Not on file  Social Connections: Not on file  Intimate Partner Violence: Not on file    Outpatient Medications Prior to Visit  Medication Sig Dispense Refill   acetaminophen (TYLENOL) 500 MG tablet Take 1,000 mg by mouth every 6 (six) hours as needed for moderate pain or headache.     levonorgestrel (LILETTA) 19.5 MCG/DAY IUD IUD Liletta 19.5 mcg/24 hrs (5 yrs) 52 mg intrauterine device  Take 1 device by intrauterine route.     amoxicillin-clavulanate (AUGMENTIN) 875-125 MG tablet Take 1 tablet by mouth every 12 (twelve) hours. 14 tablet 0   cyclobenzaprine (FLEXERIL) 10 MG tablet Take 1 tablet (10 mg total)  by mouth 3 (three) times daily as needed for muscle spasms. 30 tablet 0   methylPREDNISolone (MEDROL DOSEPAK) 4 MG TBPK tablet Take as directed 21 tablet 0   sertraline (ZOLOFT) 100 MG tablet Take 100 mg by mouth daily.     spironolactone (ALDACTONE) 100 MG tablet Take 2 tablets by mouth daily.     No facility-administered medications prior to visit.    Not on File  ROS Review of Systems Per hpi     Objective:    Physical Exam Vitals and nursing note reviewed.  Constitutional:      General: She is not in acute distress.    Appearance: Normal appearance. She is normal weight. She is not ill-appearing, toxic-appearing or diaphoretic.  Cardiovascular:     Rate and Rhythm: Normal rate and regular rhythm.     Heart sounds: Normal heart sounds. No murmur heard.   No friction rub. No gallop.  Pulmonary:     Effort: Pulmonary effort is normal. No respiratory distress.     Breath sounds: Normal breath sounds. No stridor. No wheezing, rhonchi or rales.  Chest:     Chest wall: No tenderness.  Skin:    General: Skin is warm and dry.  Neurological:     General: No focal deficit present.     Mental Status: She is alert and oriented to person, place, and time. Mental status is at baseline.     Cranial Nerves: No cranial nerve deficit.     Sensory: No sensory deficit.     Motor: No weakness.     Coordination: Coordination normal.     Gait: Gait normal.     Deep Tendon Reflexes: Reflexes normal.  Psychiatric:        Mood and Affect: Mood normal.        Behavior: Behavior normal.        Thought Content: Thought content normal.        Judgment: Judgment normal.    BP 114/78    Pulse 75    Temp 98.4 F (36.9 C) (Temporal)    Resp 17    Ht 5\' 7"  (1.702 m)    Wt 174 lb 9.6 oz (79.2 kg)    SpO2 100%    BMI 27.35 kg/m  Wt Readings from Last 3 Encounters:  04/06/21 174 lb 9.6 oz (79.2 kg)  11/04/20 169 lb (76.7 kg)  03/22/18 187 lb 9.6 oz (85.1 kg)     Health Maintenance Due  Topic  Date Due   COVID-19 Vaccine (1) Never done   PAP SMEAR-Modifier  07/22/2019    There are no preventive care reminders to display for this patient.  Lab Results  Component Value Date   TSH 1.110 01/16/2018   Lab Results  Component Value Date   WBC 10.0 11/13/2020   HGB 13.3 11/13/2020   HCT 40.0 11/13/2020   MCV 92.6 11/13/2020   PLT 225.0 11/13/2020   Lab Results  Component Value Date   NA 143 01/16/2018   K 4.5 01/16/2018   CO2 24 01/16/2018   GLUCOSE 84 01/16/2018   BUN 7 01/16/2018   CREATININE 0.68 01/16/2018   BILITOT 0.9 11/17/2016   ALKPHOS 197 (H) 11/17/2016   AST 17 11/17/2016   ALT 11 (L) 11/17/2016   PROT 6.2 (L) 11/17/2016   ALBUMIN 2.5 (L) 11/17/2016   CALCIUM 9.3 01/16/2018   ANIONGAP 8 11/17/2016   No results found for: CHOL No results found for: HDL No results found for: LDLCALC No results found for: TRIG No results found for: CHOLHDL Lab Results  Component Value Date   HGBA1C 5.0 07/21/2016      Assessment & Plan:   Problem List Items Addressed This Visit   None Visit Diagnoses     Sudden onset of severe headache    -  Primary   Relevant Orders   EKG 12-Lead (Completed)   POCT Pregnancy, Urine   Urinalysis, Routine w reflex microscopic   Visual changes       Relevant Orders   EKG 12-Lead (Completed)   POCT Pregnancy, Urine   Urinalysis, Routine w reflex microscopic   Left-sided weakness       Relevant Orders   CBC with Differential/Platelet   Comprehensive metabolic panel   Hemoglobin A1c   TSH   Lipid panel   CT HEAD WO CONTRAST (5MM)   POCT Pregnancy, Urine   Urinalysis, Routine w reflex microscopic       No orders of the defined types were placed in this encounter.   Follow-up: Return if symptoms worsen or fail to improve.   PLAN Unremarkable exam EKG collected, compared to 03/22/18. Short PR, evidence of RA enlargement, however no acute changes noted. Labs collected. Will follow up with the patient as  warranted. Suggest stressors as a major contributor. Suggest relaxation techniques Hydrate, tylenol, rest. Follow up pending labs and imaging CT head ordered Patient encouraged to call clinic with any questions, comments, or concerns.  Janeece Ageeichard Pearly Bartosik, NP

## 2021-04-07 ENCOUNTER — Ambulatory Visit
Admission: RE | Admit: 2021-04-07 | Discharge: 2021-04-07 | Disposition: A | Discharge status: Home / Self Care | Payer: BC Managed Care – PPO | Source: Ambulatory Visit | Attending: Registered Nurse | Admitting: Registered Nurse

## 2021-04-07 DIAGNOSIS — R531 Weakness: Secondary | ICD-10-CM

## 2021-04-07 DIAGNOSIS — R519 Headache, unspecified: Secondary | ICD-10-CM | POA: Diagnosis not present

## 2021-04-07 LAB — URINALYSIS, ROUTINE W REFLEX MICROSCOPIC
Bilirubin Urine: NEGATIVE
Hgb urine dipstick: NEGATIVE
Ketones, ur: NEGATIVE
Leukocytes,Ua: NEGATIVE
Nitrite: NEGATIVE
RBC / HPF: NONE SEEN (ref 0–?)
Specific Gravity, Urine: 1.015 (ref 1.000–1.030)
Total Protein, Urine: NEGATIVE
Urine Glucose: NEGATIVE
Urobilinogen, UA: 1 (ref 0.0–1.0)
pH: 7.5 (ref 5.0–8.0)

## 2021-04-07 LAB — COMPREHENSIVE METABOLIC PANEL
ALT: 7 U/L (ref 0–35)
AST: 12 U/L (ref 0–37)
Albumin: 4.3 g/dL (ref 3.5–5.2)
Alkaline Phosphatase: 58 U/L (ref 39–117)
BUN: 10 mg/dL (ref 6–23)
CO2: 30 mEq/L (ref 19–32)
Calcium: 9 mg/dL (ref 8.4–10.5)
Chloride: 103 mEq/L (ref 96–112)
Creatinine, Ser: 0.78 mg/dL (ref 0.40–1.20)
GFR: 100.04 mL/min (ref >60.00)
Glucose, Bld: 69 mg/dL — ABNORMAL LOW (ref 70–99)
Potassium: 3.8 mEq/L (ref 3.5–5.1)
Sodium: 139 mEq/L (ref 135–145)
Total Bilirubin: 1.2 mg/dL (ref 0.2–1.2)
Total Protein: 6.9 g/dL (ref 6.0–8.3)

## 2021-04-07 LAB — CBC WITH DIFFERENTIAL/PLATELET
Basophils Absolute: 0.1 10*3/uL (ref 0.0–0.1)
Basophils Relative: 0.8 % (ref 0.0–3.0)
Eosinophils Absolute: 0.1 10*3/uL (ref 0.0–0.7)
Eosinophils Relative: 0.7 % (ref 0.0–5.0)
HCT: 38.8 % (ref 36.0–46.0)
Hemoglobin: 12.9 g/dL (ref 12.0–15.0)
Lymphocytes Relative: 19.8 % (ref 12.0–46.0)
Lymphs Abs: 1.9 10*3/uL (ref 0.7–4.0)
MCHC: 33.3 g/dL (ref 30.0–36.0)
MCV: 91.1 fl (ref 78.0–100.0)
Monocytes Absolute: 0.5 10*3/uL (ref 0.1–1.0)
Monocytes Relative: 5.1 % (ref 3.0–12.0)
Neutro Abs: 7 10*3/uL (ref 1.4–7.7)
Neutrophils Relative %: 73.6 % (ref 43.0–77.0)
Platelets: 241 10*3/uL (ref 150.0–400.0)
RBC: 4.26 Mil/uL (ref 3.87–5.11)
RDW: 13.4 % (ref 11.5–15.5)
WBC: 9.6 10*3/uL (ref 4.0–10.5)

## 2021-04-07 LAB — LIPID PANEL
Cholesterol: 133 mg/dL (ref 0–200)
HDL: 45.8 mg/dL (ref >39.00)
LDL Cholesterol: 68 mg/dL (ref 0–99)
NonHDL: 86.9
Total CHOL/HDL Ratio: 3
Triglycerides: 93 mg/dL (ref 0.0–149.0)
VLDL: 18.6 mg/dL (ref 0.0–40.0)

## 2021-04-07 LAB — TSH: TSH: 1.02 u[IU]/mL (ref 0.35–5.50)

## 2021-04-07 LAB — HEMOGLOBIN A1C: Hgb A1c MFr Bld: 5.2 % (ref 4.6–6.5)

## 2021-04-08 ENCOUNTER — Other Ambulatory Visit: Payer: Medicaid Other

## 2021-04-08 ENCOUNTER — Telehealth: Payer: Self-pay

## 2021-04-08 ENCOUNTER — Other Ambulatory Visit: Payer: Self-pay | Admitting: Registered Nurse

## 2021-04-08 DIAGNOSIS — H539 Unspecified visual disturbance: Secondary | ICD-10-CM

## 2021-04-08 DIAGNOSIS — R519 Headache, unspecified: Secondary | ICD-10-CM

## 2021-04-08 DIAGNOSIS — R531 Weakness: Secondary | ICD-10-CM

## 2021-04-08 NOTE — Telephone Encounter (Signed)
Called pt and let them know and she was grateful.

## 2021-04-08 NOTE — Telephone Encounter (Signed)
Pt is asking to proceed with referral to neurology.

## 2021-04-08 NOTE — Telephone Encounter (Signed)
Caller name:Crystal Newman   On DPR? :Yes  Call back [phone]  Provider they see: Richard  Reason for call:Pt is calling not improving having tingle in her left arm still and wants to be sent to neurology

## 2021-04-08 NOTE — Progress Notes (Signed)
Pt with persistent symptoms - see last visit with me Will refer to neuro  Kathrin Ruddy, NP

## 2021-04-08 NOTE — Telephone Encounter (Signed)
Referral placed  Thanks  Rich

## 2021-04-15 ENCOUNTER — Encounter: Payer: Self-pay | Admitting: Neurology

## 2021-04-15 ENCOUNTER — Ambulatory Visit (INDEPENDENT_AMBULATORY_CARE_PROVIDER_SITE_OTHER): Payer: BC Managed Care – PPO | Admitting: Neurology

## 2021-04-15 VITALS — BP 113/78 | HR 60 | Ht 67.0 in | Wt 174.0 lb

## 2021-04-15 DIAGNOSIS — R404 Transient alteration of awareness: Secondary | ICD-10-CM | POA: Diagnosis not present

## 2021-04-15 DIAGNOSIS — G43709 Chronic migraine without aura, not intractable, without status migrainosus: Secondary | ICD-10-CM

## 2021-04-15 DIAGNOSIS — R202 Paresthesia of skin: Secondary | ICD-10-CM

## 2021-04-15 NOTE — Progress Notes (Signed)
Chief Complaint  Patient presents with   New Patient (Initial Visit)    Room 14 with husband, Reuel Boom. Referred for the following event: left eye twitching, left arm tingling, felt "zoned out". Symptoms occurred several times that night followed by a severe headache. Took Tylenol and went to bed. For the next few days, she continued feeling like she was in a fog with left-sided weakness (leg worse than arm).       ASSESSMENT AND PLAN  ELEN ACERO is a 31 y.o. female   Long history of migraine headaches Previous passing out spells,  I am not convinced that it is truly seizure, most suggestive of syncope by history 1 episode of left-sided numbness subjective weakness with headache on April 05, 2021  Differentiation diagnoses include complicated migraine headaches  MRI of the brain with without contrast to rule out structural abnormality  EEG   DIAGNOSTIC DATA (LABS, IMAGING, TESTING) - I reviewed patient records, labs, notes, testing and imaging myself where available.   MEDICAL HISTORY:  BETTE BRIENZA is a 31 year old female, seen in request by her primary care nurse practitioner Janeece Agee, NP And Dr. Neva Seat, [facility], for evaluation of left-sided numbness, headache, zoning out, initial evaluation was on April 15, 2021 with her husband  I reviewed and summarized the referring note.  She had long history of chronic migraine headaches since elementary school, typical migraine lateralized severe pounding headache with light noise sensitivity, nauseous, alleviated by sleep  Previously she also had passing out spells, has only happened in standing position, often preceded by prodrome of fainting sensation, narrowing down of her vision, heart palpitation, transient loss of consciousness, not necessarily associated with seizure activity, she was diagnosed with possible seizure in her teens, was treated with Keppra and other seizure medicine without helping her symptoms, she  also had a cardiac work-up including 1 months Holter monitoring there was no significant abnormality showed, she has not had a seizure activity since 2014  Over the years, for migraine headache has much improved, only has it occasionally.  On April 05, 2021, evening time, she developed left eye twitching, while cooking, she had a sudden onset of left hand and foot numbness, subjective weakness, each episode lasted about few minutes, there was one transient zoning out, she was able to continue cooking, no passing out, then she developed headache, with mild nausea, went to sleep, next morning, she continued to have head pressure sensation  She had subjective weakness on the left side lasting for about a week, now back to baseline,  PHYSICAL EXAM:   Vitals:   04/15/21 1522  BP: 113/78  Pulse: 60  Weight: 174 lb (78.9 kg)  Height: 5\' 7"  (1.702 m)   Not recorded     Body mass index is 27.25 kg/m.  PHYSICAL EXAMNIATION:  Gen: NAD, conversant, well nourised, well groomed                     Cardiovascular: Regular rate rhythm, no peripheral edema, warm, nontender. Eyes: Conjunctivae clear without exudates or hemorrhage Neck: Supple, no carotid bruits. Pulmonary: Clear to auscultation bilaterally   NEUROLOGICAL EXAM:  MENTAL STATUS: Speech:    Speech is normal; fluent and spontaneous with normal comprehension.  Cognition:     Orientation to time, place and person     Normal recent and remote memory     Normal Attention span and concentration     Normal Language, naming, repeating,spontaneous speech  Fund of knowledge   CRANIAL NERVES: CN II: Visual fields are full to confrontation. Pupils are round equal and briskly reactive to light. CN III, IV, VI: extraocular movement are normal. No ptosis. CN V: Facial sensation is intact to light touch CN VII: Face is symmetric with normal eye closure  CN VIII: Hearing is normal to causal conversation. CN IX, X: Phonation is  normal. CN XI: Head turning and shoulder shrug are intact  MOTOR: There is no pronator drift of out-stretched arms. Muscle bulk and tone are normal. Muscle strength is normal.  REFLEXES: Reflexes are 2+ and symmetric at the biceps, triceps, knees, and ankles. Plantar responses are flexor.  SENSORY: Intact to light touch, pinprick and vibratory sensation are intact in fingers and toes.  COORDINATION: There is no trunk or limb dysmetria noted.  GAIT/STANCE: Posture is normal. Gait is steady with normal steps, base, arm swing, and turning. Heel and toe walking are normal. Tandem gait is normal.  Romberg is absent.  REVIEW OF SYSTEMS:  Full 14 system review of systems performed and notable only for as above All other review of systems were negative.   ALLERGIES: Not on File  HOME MEDICATIONS: Current Outpatient Medications  Medication Sig Dispense Refill   acetaminophen (TYLENOL) 500 MG tablet Take 1,000 mg by mouth every 6 (six) hours as needed for moderate pain or headache.     levonorgestrel (LILETTA) 19.5 MCG/DAY IUD IUD Liletta 19.5 mcg/24 hrs (5 yrs) 52 mg intrauterine device  Take 1 device by intrauterine route.     No current facility-administered medications for this visit.    PAST MEDICAL HISTORY: Past Medical History:  Diagnosis Date   Anxiety    Asthma    sports induced   Gestational hypertension    Headache    Hidradenitis    HSV (herpes simplex virus) anogenital infection    Infection    UTI-frequent   Seizures (HCC)    unknown cause, last at age 17   Short PR-normal QRS complex syndrome    Syncope    neurally-mediated   Vitamin D deficiency     PAST SURGICAL HISTORY: Past Surgical History:  Procedure Laterality Date   TONSILLECTOMY     WISDOM TOOTH EXTRACTION      FAMILY HISTORY: Family History  Problem Relation Age of Onset   Hypertension Mother    Heart disease Mother        2 heart attack   Hyperlipidemia Mother    Hypertension  Father    Diabetes Maternal Grandmother    Heart disease Maternal Grandmother    Hypertension Maternal Grandmother    Heart disease Maternal Grandfather    Heart attack Maternal Grandfather        multiple before age 33.   Diabetes Paternal Grandmother    Heart disease Paternal Grandmother    Cancer Paternal Grandfather        lung   Cancer Maternal Aunt        ovarian    SOCIAL HISTORY: Social History   Socioeconomic History   Marital status: Married    Spouse name: Not on file   Number of children: 2   Years of education: some college   Highest education level: High school graduate  Occupational History   Occupation: front desk at [employer]  Tobacco Use   Smoking status: Every Day    Packs/day: 0.50    Years: 8.00    Pack years: 4.00    Types: Cigarettes  Smokeless tobacco: Never  Vaping Use   Vaping Use: Never used  Substance and Sexual Activity   Alcohol use: No   Drug use: No   Sexual activity: Yes    Partners: Male    Birth control/protection: None  Other Topics Concern   Not on file  Social History Narrative   Lives at home with family.   Right-handed.   Caffeine use: 6-7 cups per day.   Social Determinants of Health   Financial Resource Strain: Not on file  Food Insecurity: Not on file  Transportation Needs: Not on file  Physical Activity: Not on file  Stress: Not on file  Social Connections: Not on file  Intimate Partner Violence: Not on file      Levert Feinstein, M.D. Ph.D.  [facility] [address], [address] [place], [place] [address] Ph: [phone] Fax: [phone]  CC:  Janeece Agee, NP [address] [place],  [place] [address]  Shade Flood, MD

## 2021-04-19 ENCOUNTER — Ambulatory Visit (INDEPENDENT_AMBULATORY_CARE_PROVIDER_SITE_OTHER): Payer: BC Managed Care – PPO | Admitting: Neurology

## 2021-04-19 DIAGNOSIS — R55 Syncope and collapse: Secondary | ICD-10-CM | POA: Diagnosis not present

## 2021-04-19 DIAGNOSIS — R202 Paresthesia of skin: Secondary | ICD-10-CM

## 2021-04-19 DIAGNOSIS — R404 Transient alteration of awareness: Secondary | ICD-10-CM

## 2021-04-19 DIAGNOSIS — G43709 Chronic migraine without aura, not intractable, without status migrainosus: Secondary | ICD-10-CM

## 2021-04-20 ENCOUNTER — Telehealth: Payer: Self-pay | Admitting: Neurology

## 2021-04-20 NOTE — Telephone Encounter (Signed)
[employer] Josem Kaufmann: [id number] (exp. 04/20/21 to 05/19/21) [facility] healthy blue Josem Kaufmann: [id number] (exp. 04/20/21 to 06/18/21) order sent to GI. They will reach out to the patient to schedule.

## 2021-04-29 NOTE — Procedures (Signed)
° °  HISTORY: 31 year-old female, presented with episode of passing out spells, frequent headaches,   TECHNIQUE:  This is a routine 16 channel EEG recording with one channel devoted to a limited EKG recording.  It was performed during wakefulness, drowsiness and asleep.  Hyperventilation and photic stimulation were performed as activating procedures.  There are minimum muscle and movement artifact noted.  Upon maximum arousal, posterior dominant waking rhythm consistent of rhythmic alpha range activity, with frequency of 9 Hz. Activities are symmetric over the bilateral posterior derivations and attenuated with eye opening.  At the end of hyperventilation and post hyperventilation, there was generalized high amplitude slow activities, no epileptiform discharge.  Photic stimulation did not alter the tracing.  During EEG recording, patient developed drowsiness and no deeper stage of sleep was achieved  EKG demonstrate sinus rhythm, with heart rate of 56 bpm  CONCLUSION: This is a slight abnormal EEG, there was concern of unusual high amplitude and slower activity following hyperventilation.  But there was no evidence of epileptiform discharge.  Levert Feinstein, M.D. Ph.D.  [facility] [address] [place], [place] [address] Phone: [phone] Fax:      [phone]

## 2021-05-03 ENCOUNTER — Ambulatory Visit
Admission: RE | Admit: 2021-05-03 | Discharge: 2021-05-03 | Disposition: A | Discharge status: Home / Self Care | Payer: BC Managed Care – PPO | Source: Ambulatory Visit | Attending: Neurology | Admitting: Neurology

## 2021-05-03 ENCOUNTER — Telehealth: Payer: Self-pay | Admitting: Neurology

## 2021-05-03 DIAGNOSIS — G43709 Chronic migraine without aura, not intractable, without status migrainosus: Secondary | ICD-10-CM | POA: Diagnosis not present

## 2021-05-03 DIAGNOSIS — R404 Transient alteration of awareness: Secondary | ICD-10-CM

## 2021-05-03 DIAGNOSIS — R202 Paresthesia of skin: Secondary | ICD-10-CM

## 2021-05-03 MED ORDER — GADOBENATE DIMEGLUMINE 529 MG/ML IV SOLN
16.0000 mL | Freq: Once | INTRAVENOUS | Status: AC | PRN
  Administered 2021-05-03: 16 mL via INTRAVENOUS

## 2021-05-03 NOTE — Telephone Encounter (Addendum)
Please call patient, there is evidence of higher amplitude, slow brain activity following hyperventilation  But there is no clear evidence of epileptiform discharge  We will order outpatient 24 eeg monitoring  Patient agreed on proceed  Orders Placed This Encounter  Procedures   AMBULATORY EEG

## 2021-05-04 NOTE — Telephone Encounter (Signed)
Faxed 24-hour EEG order to [facility]. Received a receipt of confirmation.  I called patient. I advised her of this. I asked her to call us if she has not gotten a call to schedule her EEG within 1-2 weeks. Pt verbalized understanding.

## 2021-05-04 NOTE — Addendum Note (Signed)
Addended by: Marcial Pacas on: 05/04/2021 01:18 PM   Modules accepted: Orders

## 2021-05-06 NOTE — Telephone Encounter (Signed)
Patient is scheduled for In-Home EEG 3/13-3/14/23. ?

## 2021-05-17 DIAGNOSIS — R404 Transient alteration of awareness: Secondary | ICD-10-CM | POA: Diagnosis not present

## 2021-05-18 DIAGNOSIS — R404 Transient alteration of awareness: Secondary | ICD-10-CM

## 2021-06-02 NOTE — Telephone Encounter (Signed)
Pt inquired about In-Home EEG results ?

## 2021-06-03 NOTE — Telephone Encounter (Signed)
I will follow up on them and complete it asap  ?

## 2021-06-03 NOTE — Telephone Encounter (Signed)
I reached out to [facility] with Ashir Oath ( # [phone]) and checked on this request for the pt. She sts the study has been completed but she could not see if documents have been uploaded. She is to call me back with an up date.  ?

## 2021-06-03 NOTE — Telephone Encounter (Signed)
I have attempted to call  neurodiagnostic to see about results, but was advised their office was currently closed.. I will try again at a later time.  ?

## 2021-06-03 NOTE — Telephone Encounter (Signed)
Crystal Newman called back and she confirmed the results of EEG monitoring should be viewable to provider at this time.  ?I advised I would send message on this.  ?

## 2021-06-04 ENCOUNTER — Encounter: Payer: Self-pay | Admitting: Neurology

## 2021-06-04 DIAGNOSIS — R404 Transient alteration of awareness: Secondary | ICD-10-CM

## 2021-06-04 NOTE — Procedures (Signed)
? ?  Clinical History : ?This is a 31 year old Female who presents with migraines, passing out spells, and one episode of left sided weakness. ? ?Technical Description: ? ?INTERMITTENT MONITORING with VIDEO TECHNICAL SUMMARY: ?Long-Term EEG with Video was monitored intermittently by a qualified EEG technologist for the entirety of the recording; quality check-ins were performed at a minimum of every two hours, checking and documenting real-time data and video to assure the integrity and quality of the recording (e.g., camera position, electrode integrity and impedance), and identify the need for maintenance. For intermittent monitoring, an EEG Technologist monitored no more than 12 patients concurrently. Diagnostic video was captured at least 80% of the time during the recording. ? ?PATIENT EVENTS: ?There were no patient events noted or captured during this recording. ? ?TECHNOLOGIST EVENTS: ?No clear epileptiform activity was detected by the reviewing neurodiagnostic technologist during the recording for further evaluation. ? ?TIME SAMPLES: ?10-minutes of every 2 hours recorded are reviewed as random time samples. ?SLEEP SAMPLES: ? ?5-minutes of every 24 hour recorded sleep cycle are reviewed as random sleep samples. ? ?AWAKE: ?At maximal level of alertness, the posterior dominant background activity was continuous, reactive, low voltage rhythm of 10 Hz. This was symmetric, well-modulated, and attenuated with eye opening. Diffuse, symmetric, frontocentral beta range activity was present. ? ?SLEEP: ?N1 Sleep (Stage 1) was observed and characterized by the disappearance of alpha rhythm and the appearance of vertex activity. ?N2 Sleep (Stage 2) was observed and characterized by vertex waves, K-complexes, and sleep spindles. ?N3 (Stage 3) sleep was observed and characterized by high amplitude Delta activity of 20%. ?REM sleep was observed. ? ?EKG: ?There were no arrhythmias or abnormalities noted during this  recording. ? ? ?Physician Interpretation: ?This is a normal ambulatory video EEG. However, does not rule out epilepsy. ? ? ?Alric Ran, MD ?[facility] ? ? ?

## 2021-07-14 ENCOUNTER — Encounter: Payer: Self-pay | Admitting: Neurology

## 2021-07-15 NOTE — Telephone Encounter (Signed)
Please call patient, check with her ? ?1.  Migraine frequency ?2.  Severity ?3.  How long the last ?4.  What medicine she has tried, ?5.  Does she have aura ? ? ?If she has frequent migraine headaches, may consider daily preventive medications ? ?The other option is just on abortive treatment, ?

## 2021-07-19 ENCOUNTER — Telehealth: Payer: Self-pay | Admitting: *Deleted

## 2021-07-19 DIAGNOSIS — G43709 Chronic migraine without aura, not intractable, without status migrainosus: Secondary | ICD-10-CM

## 2021-07-19 DIAGNOSIS — R404 Transient alteration of awareness: Secondary | ICD-10-CM

## 2021-07-19 MED ORDER — SUMATRIPTAN SUCCINATE 50 MG PO TABS: ORAL_TABLET | ORAL | 6 refills | Status: DC

## 2021-07-19 MED ORDER — TOPIRAMATE 100 MG PO TABS: 100.0000 mg | ORAL_TABLET | Freq: Two times a day (BID) | ORAL | 6 refills | Status: DC

## 2021-07-19 NOTE — Telephone Encounter (Signed)
Phone note in Epic. Called patient. ?

## 2021-07-19 NOTE — Telephone Encounter (Signed)
I spoke to the patient. Estimates one migraine weekly that can last 2-3 hours up to 1.5 days. She will sometimes feel "spaced out" prior to the headache coming on. Never any loss of consciousness. She has been using mostly Tylenol as needed, occasionally Advil. She was previously given a prescription for Fioricet that worked for her but it has been a while (none at home). She has never tried any preventive medications in the past. No allergies to any medications. Open to starting treatment.  ?

## 2021-07-19 NOTE — Telephone Encounter (Signed)
I spoke to the patient. She is agreeable to the recommended treatment plan. She scheduled a two month follow up with Maralyn Sago in July. Concerned about medication side effects and will slowly titrate up to the goal dose of topiramate. She will call us back with any further concerns.  ?

## 2021-07-19 NOTE — Addendum Note (Signed)
Addended by: Levert Feinstein on: 07/19/2021 05:49 PM ? ? Modules accepted: Orders ? ?

## 2021-07-19 NOTE — Telephone Encounter (Signed)
Meds ordered this encounter  ?Medications  ? topiramate (TOPAMAX) 100 MG tablet  ?  Sig: Take 1 tablet (100 mg total) by mouth 2 (two) times daily.  ?  Dispense:  60 tablet  ?  Refill:  6  ? SUMAtriptan (IMITREX) 50 MG tablet  ?  Sig: May repeat in 2 hours if headache persists or recurs.  ?  Dispense:  10 tablet  ?  Refill:  6  ?Please call patient, Topamax 100 twice a day as preventive medications, Imitrex as needed for abortive treatment, give her follow-up visit with nurse practitioner in 1 to 44-month ?

## 2021-07-19 NOTE — Telephone Encounter (Addendum)
Left message for a return call. ? ?Mychart message from patient: ? ?Hi Dr. Terrace Arabia. At the last visit you had mentioned potentially trying some medication for migraines. I?ve noticed that since the initial visit, I?ve noticed more of the migraine episodes happening. Not the the extent of the first one, but still occurring. Is this something we could possibly try some medication as needed for?  ?___________________________________ ?Attempted to reach patient by phone and left message for a return call. ? ?Need the following information: ?  ?1.  Migraine frequency ?2.  Severity ?3.  How long the last ?4.  What medicine she has tried, ?5.  Does she have aura ?__________________________________ ?Per Dr. Terrace Arabia: ? ?If she has frequent migraine headaches, may consider daily preventive medications ?  ?The other option is just on abortive treatment, ?

## 2021-09-15 ENCOUNTER — Encounter: Payer: Self-pay | Admitting: Neurology

## 2021-09-15 ENCOUNTER — Ambulatory Visit (INDEPENDENT_AMBULATORY_CARE_PROVIDER_SITE_OTHER): Payer: BC Managed Care – PPO | Admitting: Neurology

## 2021-09-15 VITALS — BP 113/76 | HR 71 | Ht 67.0 in | Wt 172.5 lb

## 2021-09-15 DIAGNOSIS — G43709 Chronic migraine without aura, not intractable, without status migrainosus: Secondary | ICD-10-CM | POA: Diagnosis not present

## 2021-09-15 MED ORDER — TOPIRAMATE 100 MG PO TABS: 100.0000 mg | ORAL_TABLET | Freq: Two times a day (BID) | ORAL | 3 refills | Status: DC

## 2021-09-15 NOTE — Patient Instructions (Signed)
You look great today  Continue the Topamax  Call for any spells, headaches

## 2021-09-15 NOTE — Progress Notes (Signed)
Patient: Crystal Newman Date of Birth: Apr 25, 1990  Reason for Visit: Follow up History from: Patient Primary Neurologist: Dr. Terrace Arabia   ASSESSMENT AND PLAN 31 y.o. year old female   1.  Migraine headaches 2.  Passing out spells 3.  Episode of left-sided numbness subjective weakness with headache on April 05, 2021  -Doing well on Topamax 100 mg twice daily, will continue this, no recurrent passing out spells, continue Imitrex as needed for acute headache -EEG showed evidence of higher amplitude, slow brain activity following hyperventilation, no clear epileptiform discharge -Ambulatory 24-hour EEG was normal -MRI of the brain with and without contrast was normal -Follow over time, differential diagnosis includes complicated migraine versus syncope, less likely true seizure, I will see her back in 6 months  HISTORY  Crystal Newman is a 31 year old female, seen in request by her primary care nurse practitioner Janeece Agee, NP And Dr. Neva Seat, [facility], for evaluation of left-sided numbness, headache, zoning out, initial evaluation was on April 15, 2021 with her husband   I reviewed and summarized the referring note.   She had long history of chronic migraine headaches since elementary school, typical migraine lateralized severe pounding headache with light noise sensitivity, nauseous, alleviated by sleep   Previously she also had passing out spells, has only happened in standing position, often preceded by prodrome of fainting sensation, narrowing down of her vision, heart palpitation, transient loss of consciousness, not necessarily associated with seizure activity, she was diagnosed with possible seizure in her teens, was treated with Keppra and other seizure medicine without helping her symptoms, she also had a cardiac work-up including 1 months Holter monitoring there was no significant abnormality showed, she has not had a seizure activity since 2014  Over the years, for  migraine headache has much improved, only has it occasionally.   On April 05, 2021, evening time, she developed left eye twitching, while cooking, she had a sudden onset of left hand and foot numbness, subjective weakness, each episode lasted about few minutes, there was one transient zoning out, she was able to continue cooking, no passing out, then she developed headache, with mild nausea, went to sleep, next morning, she continued to have head pressure sensation  She had subjective weakness on the left side lasting for about a week, now back to baseline  Update September 15, 2021 SS: Remains on Topamax 100 mg twice daily. 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 None since then. Took her several weeks to get to full dose.   -EEG showed evidence of higher amplitude, slow brain activity following hyperventilation, no clear epileptiform discharge -Ambulatory 24-hour EEG was normal -MRI of the brain with and without contrast was normal  REVIEW OF SYSTEMS: Out of a complete 14 system review of symptoms, the patient complains only of the following symptoms, and all other reviewed systems are negative.  See HPI  ALLERGIES: No Known Allergies  HOME MEDICATIONS: Outpatient Medications Prior to Visit  Medication Sig Dispense Refill   acetaminophen (TYLENOL) 500 MG tablet Take 1,000 mg by mouth every 6 (six) hours as needed for moderate pain or headache.     levonorgestrel (LILETTA) 19.5 MCG/DAY IUD IUD Liletta 19.5 mcg/24 hrs (5 yrs) 52 mg intrauterine device  Take 1 device by intrauterine route.  SUMAtriptan (IMITREX) 50 MG tablet May repeat in 2 hours if headache persists or recurs. 10 tablet 6   topiramate (TOPAMAX) 100 MG tablet Take 1 tablet (100 mg total) by mouth 2 (two)  times daily. 60 tablet 6   No facility-administered medications prior to visit.    PAST MEDICAL HISTORY: Past Medical History:  Diagnosis Date   Anxiety    Asthma    sports induced   Gestational hypertension    Headache    Hidradenitis    HSV (herpes simplex virus) anogenital infection    Infection    UTI-frequent   Seizures (HCC)    unknown cause, last at age 48   Short PR-normal QRS complex syndrome    Syncope    neurally-mediated   Vitamin D deficiency     PAST SURGICAL HISTORY: Past Surgical History:  Procedure Laterality Date   TONSILLECTOMY     WISDOM TOOTH EXTRACTION      FAMILY HISTORY: Family History  Problem Relation Age of Onset   Hypertension Mother    Heart disease Mother        2 heart attack   Hyperlipidemia Mother    Hypertension Father    Diabetes Maternal Grandmother    Heart disease Maternal Grandmother    Hypertension Maternal Grandmother    Heart disease Maternal Grandfather    Heart attack Maternal Grandfather        multiple before age 52.   Diabetes Paternal Grandmother    Heart disease Paternal Grandmother    Cancer Paternal Grandfather        lung   Cancer Maternal Aunt        ovarian    SOCIAL HISTORY: Social History   Socioeconomic History   Marital status: Married    Spouse name: Not on file   Number of children: 2   Years of education: some college   Highest education level: High school graduate  Occupational History   Occupation: front desk at [employer]  Tobacco Use   Smoking status: Every Day    Packs/day: 0.50    Years: 8.00    Total pack years: 4.00    Types: Cigarettes   Smokeless tobacco: Never  Vaping Use   Vaping Use: Never used  Substance and Sexual Activity   Alcohol use: No   Drug use: No   Sexual activity: Yes    Partners: Male    Birth control/protection: None  Other Topics Concern   Not on file  Social History Narrative   Lives at home with family.   Right-handed.   Caffeine use: 6-7 cups  per day.   Social Determinants of Health   Financial Resource Strain: Not on file  Food Insecurity: Not on file  Transportation Needs: Not on file  Physical Activity: Not on file  Stress: Not on file  Social Connections: Not on file  Intimate Partner Violence: Not on file    PHYSICAL EXAM  Vitals:   09/15/21 1523  BP: 113/76  Pulse: 71  Weight: 172 lb 8 oz (78.2 kg)  Height: 5\' 7"  (1.702 m)   Body mass index is 27.02 kg/m.  Generalized: Well developed, in no acute distress  Neurological examination  Mentation: Alert oriented to time, place, history taking. Follows all commands speech and language fluent Cranial nerve II-XII: Pupils were equal round reactive to light. Extraocular movements were full, visual field were full on confrontational test. Facial sensation and strength were normal.  Head turning and shoulder shrug  were normal and symmetric. Motor: The motor testing reveals 5 over 5 strength of all 4 extremities. Good symmetric motor tone is noted throughout.  Sensory: Sensory testing is intact to soft touch on all 4 extremities. No evidence of extinction is noted.  Coordination: Cerebellar testing reveals good finger-nose-finger and heel-to-shin bilaterally.  Gait and station: Gait is normal. Tandem gait is normal.  Reflexes: Deep tendon reflexes are symmetric and normal bilaterally.   DIAGNOSTIC DATA (LABS, IMAGING, TESTING) - I reviewed patient records, labs, notes, testing and imaging myself where available.  Lab Results  Component Value Date   WBC 9.6 04/06/2021   HGB 12.9 04/06/2021   HCT 38.8 04/06/2021   MCV 91.1 04/06/2021   PLT 241.0 04/06/2021      Component Value Date/Time   NA 139 04/06/2021 1503   NA 143 01/16/2018 1640   K 3.8 04/06/2021 1503   CL 103 04/06/2021 1503   CO2 30 04/06/2021 1503   GLUCOSE 69 (L) 04/06/2021 1503   BUN 10 04/06/2021 1503   BUN 7 01/16/2018 1640   CREATININE 0.78 04/06/2021 1503   CALCIUM 9.0 04/06/2021 1503    PROT 6.9 04/06/2021 1503   ALBUMIN 4.3 04/06/2021 1503   AST 12 04/06/2021 1503   ALT 7 04/06/2021 1503   ALKPHOS 58 04/06/2021 1503   BILITOT 1.2 04/06/2021 1503   GFRNONAA 119 01/16/2018 1640   GFRAA 137 01/16/2018 1640   Lab Results  Component Value Date   CHOL 133 04/06/2021   HDL 45.80 04/06/2021   LDLCALC 68 04/06/2021   TRIG 93.0 04/06/2021   CHOLHDL 3 04/06/2021   Lab Results  Component Value Date   HGBA1C 5.2 04/06/2021   No results found for: "VITAMINB12" Lab Results  Component Value Date   TSH 1.02 04/06/2021    Margie Ege, AGNP-C, DNP 09/15/2021, 3:52 PM [facility] [address], [address] [place], [place] [address] [phone]

## 2021-11-02 IMAGING — US US ABDOMEN LIMITED
1 series · 14 of 15 positions shown · non-contrast
Comparison: Rib series from 11/13/2020

CLINICAL DATA: Left upper quadrant pain with palpable abnormality
and difficulty breathing

EXAM:
ULTRASOUND ABDOMEN LIMITED

[Series 1: us abdomen limited · 0.20mm/px · 15 acquisitions, 14 frames shown]
[im 1/15]
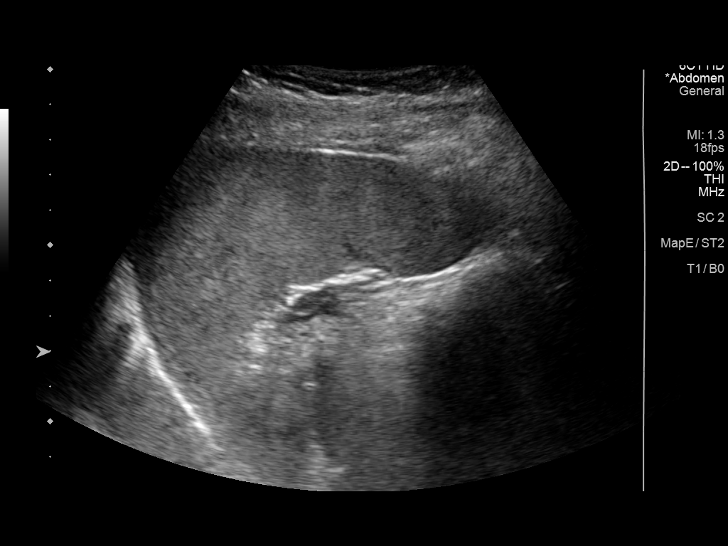
[im 2/15]
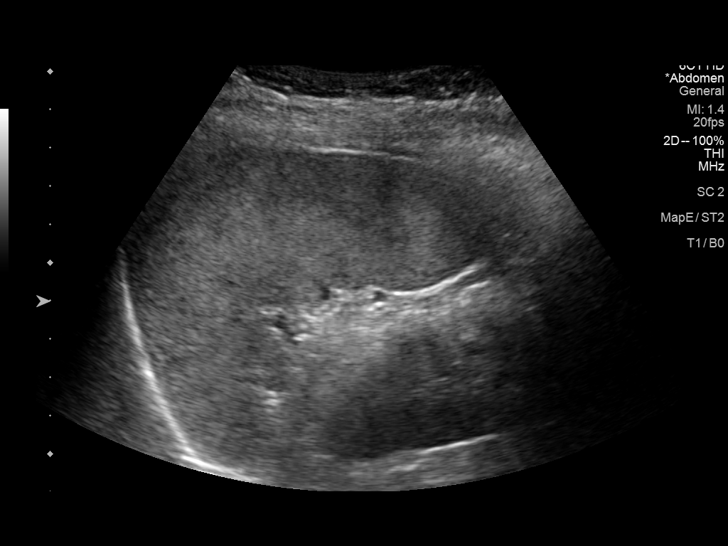
[im 3/15]
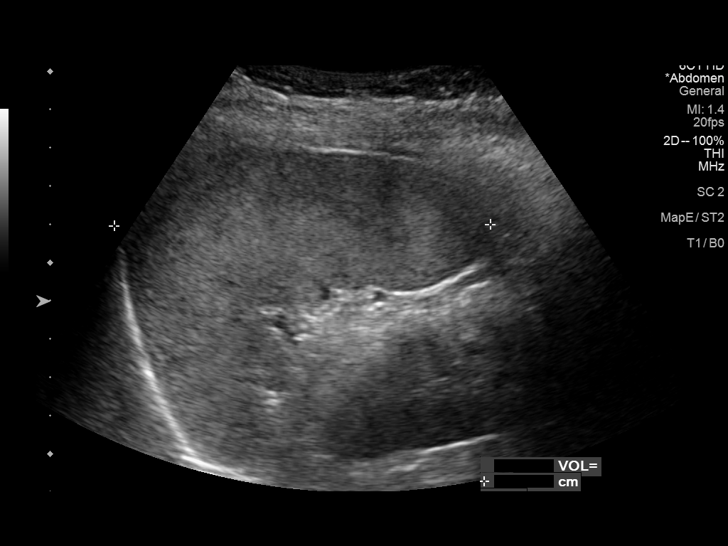
[im 4/15]
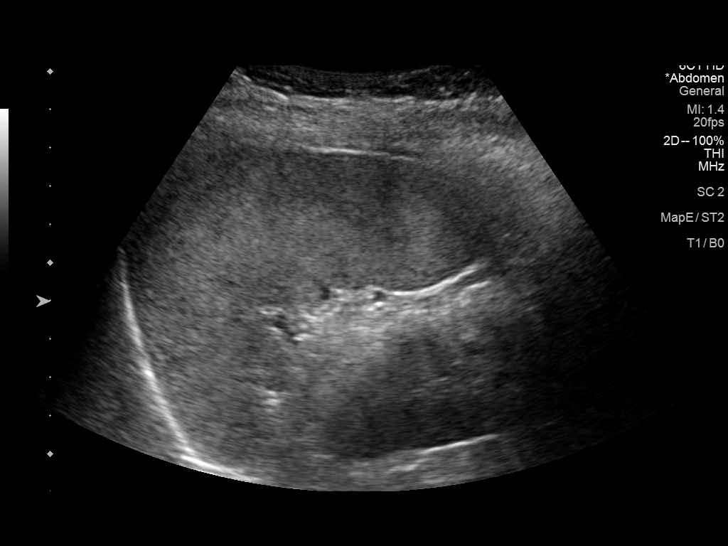
[im 5/15]
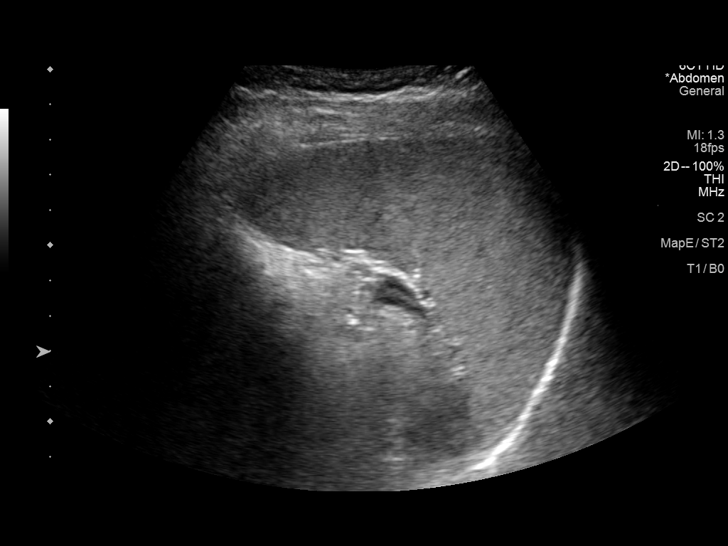
[im 6/15]
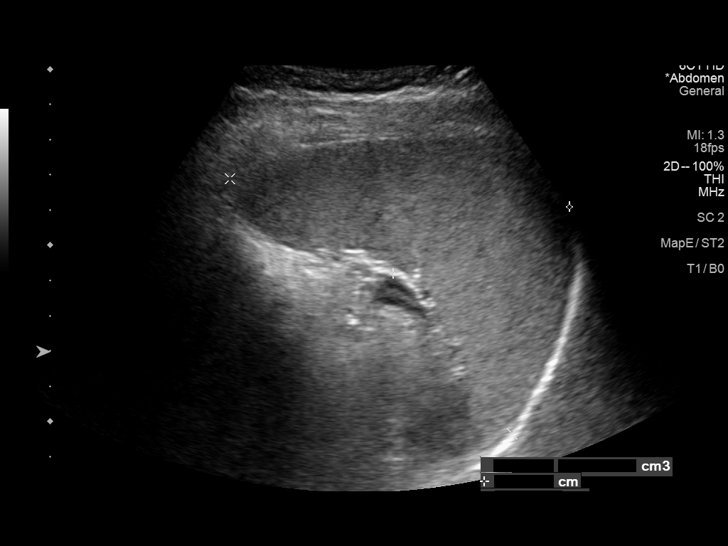
[im 7/15]
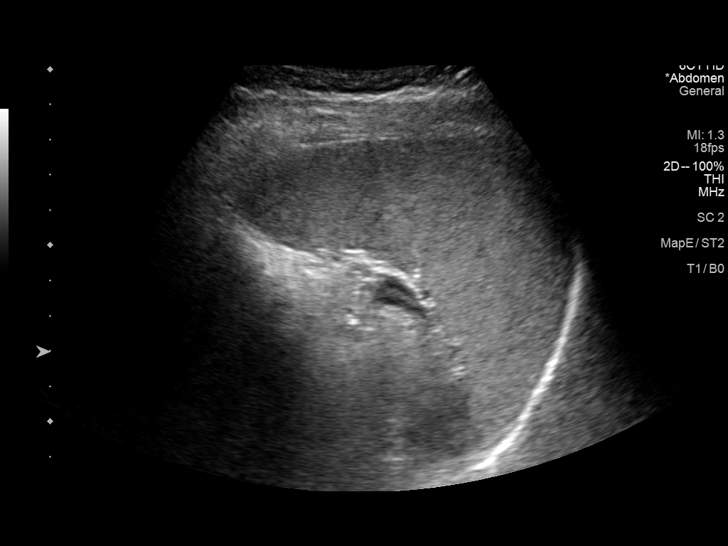
[im 9/15]
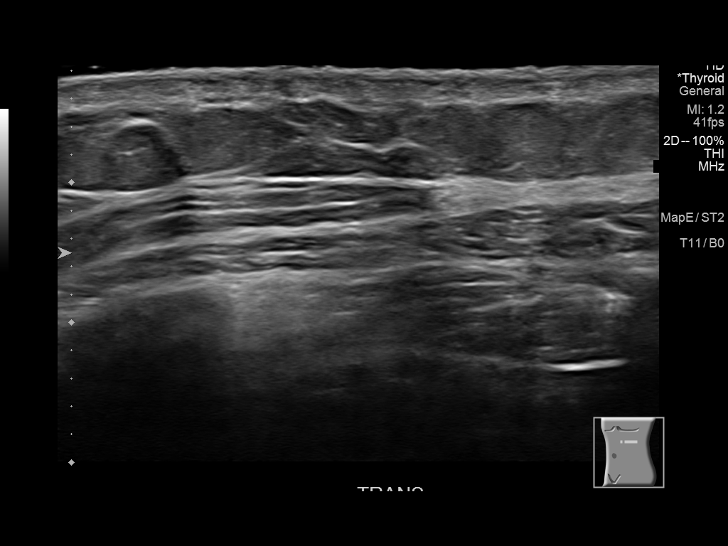
[im 10/15]
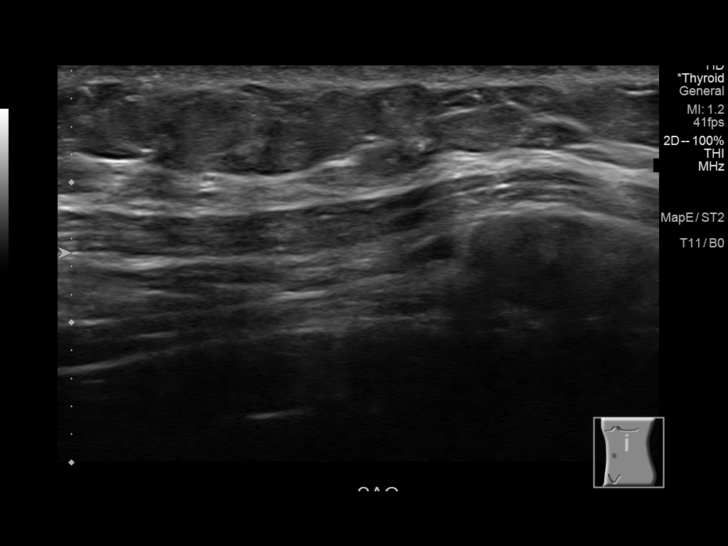
[im 11/15]
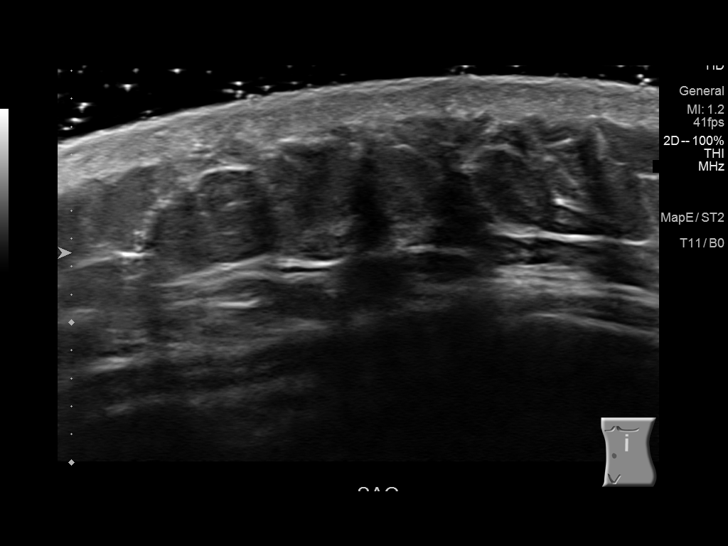
[im 12/15]
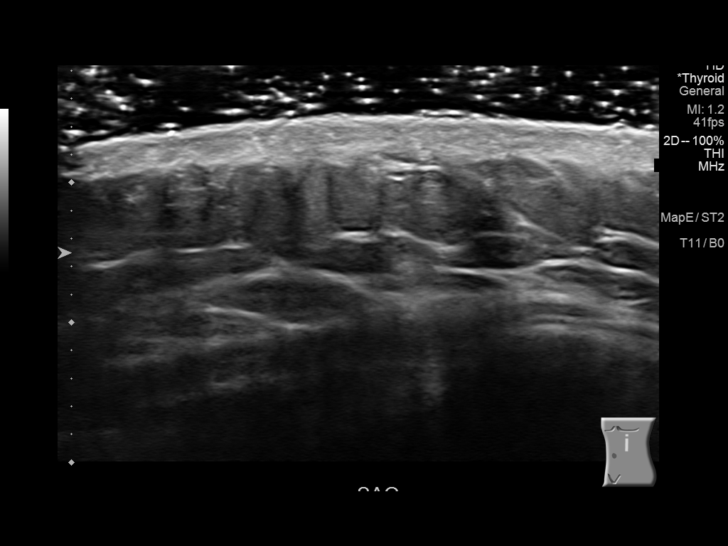
[im 13/15]
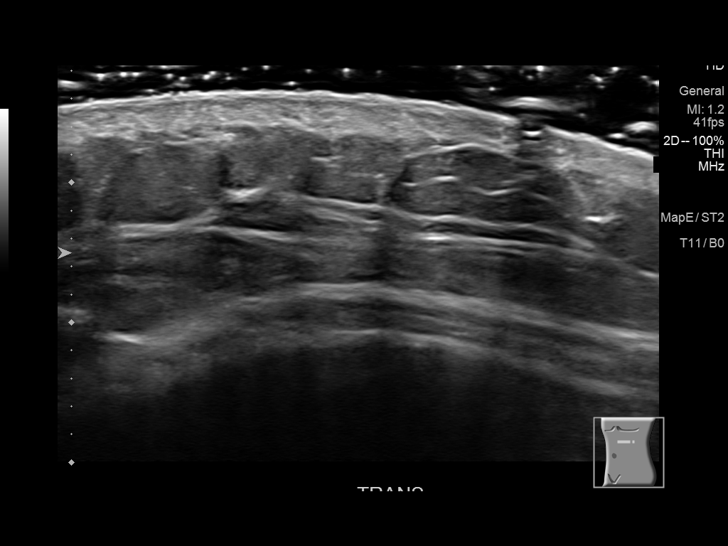
[im 14/15]
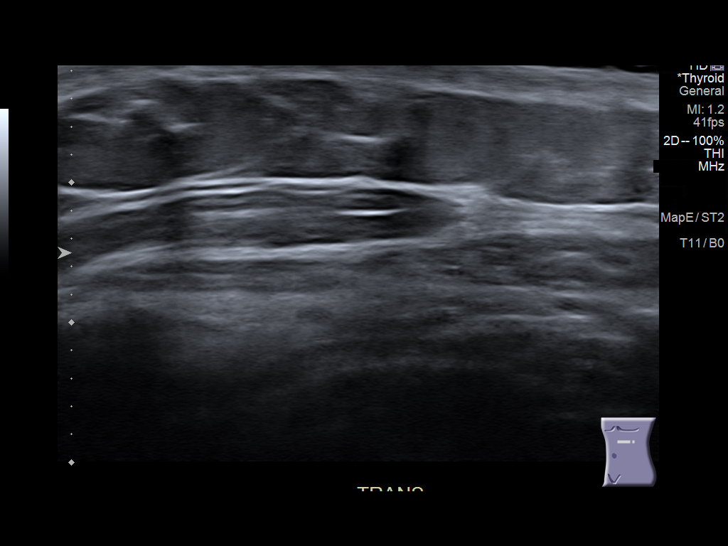
[im 15/15]
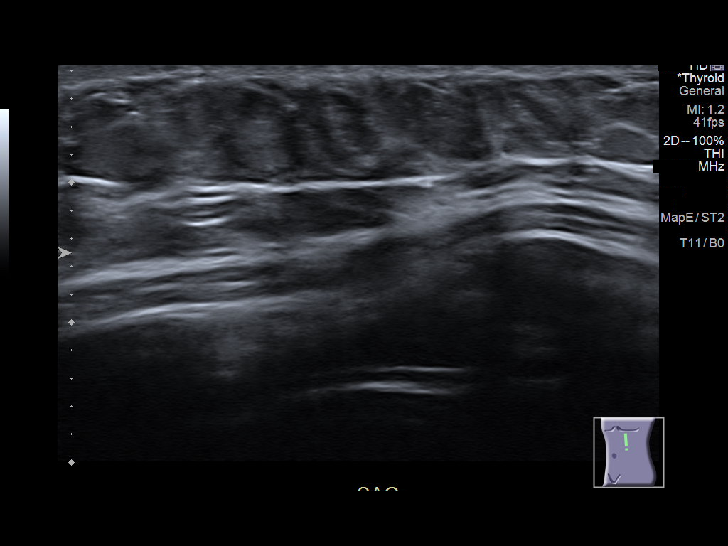

[14 of 15 positions shown; findings below may reference images not displayed]

FINDINGS: Spleen is well visualize with calculated volume of 297 mL. This is
within normal limits. No focal mass is noted. Scanning in the area
of clinical concern on the left chest wall shows no focal
abnormality.
IMPRESSION: Normal-appearing spleen.

No subcutaneous abnormality to correspond with the given clinical
history is noted.

## 2022-01-17 ENCOUNTER — Telehealth (INDEPENDENT_AMBULATORY_CARE_PROVIDER_SITE_OTHER): Payer: BC Managed Care – PPO | Admitting: Family Medicine

## 2022-01-17 ENCOUNTER — Encounter: Payer: Self-pay | Admitting: Family Medicine

## 2022-01-17 VITALS — Temp 100.8°F | Ht 67.0 in | Wt 170.0 lb

## 2022-01-17 DIAGNOSIS — J111 Influenza due to unidentified influenza virus with other respiratory manifestations: Secondary | ICD-10-CM

## 2022-01-17 DIAGNOSIS — R051 Acute cough: Secondary | ICD-10-CM | POA: Diagnosis not present

## 2022-01-17 MED ORDER — BENZONATATE 100 MG PO CAPS: 100.0000 mg | ORAL_CAPSULE | Freq: Three times a day (TID) | ORAL | 0 refills | Status: DC | PRN

## 2022-01-17 MED ORDER — OSELTAMIVIR PHOSPHATE 75 MG PO CAPS: 75.0000 mg | ORAL_CAPSULE | Freq: Two times a day (BID) | ORAL | 0 refills | Status: DC

## 2022-01-17 NOTE — Progress Notes (Signed)
Virtual Visit via Video Note  I connected with Crystal Newman on 01/17/22 at 3:22 PM by a video enabled telemedicine application and verified that I am speaking with the correct person using two identifiers.  Patient location: car, outside of work - by self My location: office - [place].    I discussed the limitations, risks, security and privacy concerns of performing an evaluation and management service by telephone and the availability of in person appointments. I also discussed with the patient that there may be a patient responsible charge related to this service. The patient expressed understanding and agreed to proceed, consent obtained  Chief complaint:  Chief Complaint  Patient presents with   Nasal Congestion    Kids were diagnosed with the flu on Wednesday then her husband started feeling bad Thursday then she started feeling bad Saturday evening, nasal congestion, cough, fever, body aches, headaches, sneezing, and just feels horrible     History of Present Illness: Crystal Newman is a 31 y.o. female  Nasal congestion, exposure to influenza.  As above, kids diagnosed with flu 5 days ago with positive flu testing, she started to have symptoms 2 days ago with nasal congestion cough, fever - 101, 100.8 today. body aches, headache, sneezing, malaise.  No dyspnea or chest pain.  Slight dizziness this am, after feeling flushed.  Drinking fluids.  Feels worse today - mor bodyache and chills, more sinus congestion. Covid test negative 2 nights ago and yesterday.   Tx: theraflu, mucinex, dayquil. Able to sleep ok.  Did not receive flu vaccine this year.    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   Rubella non-immune status, antepartum 11/18/2014   Previous recurrent miscarriages affecting pregnancy, antepartum 11/18/2014   Hidradenitis 07/21/2011   Past Medical History:  Diagnosis Date   Anxiety    Asthma    sports induced   Gestational hypertension    Headache    Hidradenitis    HSV (herpes simplex virus) anogenital infection    Infection    UTI-frequent   Seizures (HCC)    unknown cause, last at age 43   Short PR-normal QRS complex syndrome    Syncope    neurally-mediated   Vitamin D deficiency    Past Surgical History:  Procedure Laterality Date   TONSILLECTOMY     WISDOM TOOTH EXTRACTION     No Known Allergies Prior to Admission medications   Medication Sig Start Date End Date Taking? Authorizing Provider  acetaminophen (TYLENOL) 500 MG tablet Take 1,000 mg by mouth every 6 (six) hours as needed for moderate pain or headache.   Yes [provider]  levonorgestrel (LILETTA) 19.5 MCG/DAY IUD IUD Liletta 19.5 mcg/24 hrs (5 yrs) 52 mg intrauterine device  Take 1 device by intrauterine route.   Yes [provider]  SUMAtriptan (IMITREX) 50 MG tablet May repeat in 2 hours if headache persists or recurs. 07/19/21  Yes Levert Feinstein, MD  topiramate (TOPAMAX) 100 MG tablet Take 1 tablet (100 mg total) by mouth 2 (two) times daily. 09/15/21  Yes Glean Salvo, NP  levonorgestrel (LILETTA, 52 MG,) 20.1 MCG/DAY IUD IUD Liletta 20.4 mcg/24 hrs (  8 yrs) 52 mg intrauterine device  Take 1 device by intrauterine route.    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   Right-handed.   Caffeine use: 6-7 cups per day.   Social Determinants of Health   Financial Resource Strain: Not on file  Food Insecurity: Not on file  Transportation Needs: Not on file  Physical Activity: Not on file  Stress: Not on file  Social Connections: Not on file  Intimate Partner Violence: Not on file    Observations/Objective: Vitals:   01/17/22 1420  Temp: (!) 100.8 F (38.2 C)  Weight: 170 lb (77.1 kg)  Height: 5\' 7"  (1.702 m)   Nontoxic appearance on video, speaking in full sentences without respiratory distress.  Euthymic mood.  No audible wheeze or stridor.  Few episodes of cough during visit.  All questions were answered with understanding of plan expressed.  Assessment and Plan: Influenza - Plan: oseltamivir (TAMIFLU) 75 MG capsule  Acute cough - Plan: benzonatate (TESSALON) 100 MG capsule  Likely influenza with sick contact at home with positive flu testing last week.  Home treatment at this time.  Tamiflu discussed including potential risks, side effects, would like to start, will start today as within 48 hours.  Symptomatic care discussed with Mucinex, Mucinex DM, Tylenol, fluids, rest and Tessalon Perles if needed.  Recommended out of work until fever free off antipyretics, ER/RTC precautions given. Follow Up Instructions:  As needed I discussed the assessment and treatment plan with the patient. The patient was provided an opportunity to ask questions and all were answered. The patient agreed with the plan and demonstrated an understanding of the instructions.   The patient was advised to call back or seek an  in-person evaluation if the symptoms worsen or if the condition fails to improve as anticipated.   , MD

## 2022-01-17 NOTE — Patient Instructions (Signed)
Sorry to hear that you are sick.  Mucinex or Mucinex DM can be helpful for cough, Tylenol if needed for body aches or fever.  Make sure to drink plenty of fluids and rest.  Let me know if you need a note for work.  Tamiflu was sent to your pharmacy, start that today as that needs to be started within 48 hours to be effective.  See other information below.  Hope you feel better soon, please let me know if I can help further.  Influenza, Adult Influenza, also called "the flu," is a viral infection that mainly affects the respiratory tract. This includes the lungs, nose, and throat. The flu spreads easily from person to person (is contagious). It causes common cold symptoms, along with high fever and body aches. What are the causes? This condition is caused by the influenza virus. You can get the virus by: Breathing in droplets that are in the air from an infected person's cough or sneeze. Touching something that has the virus on it (has been contaminated) and then touching your mouth, nose, or eyes. What increases the risk? The following factors may make you more likely to get the flu: Not washing or sanitizing your hands often. Having close contact with many people during cold and flu season. Touching your mouth, eyes, or nose without first washing or sanitizing your hands. Not getting an annual flu shot. You may have a higher risk for the flu, including serious problems, such as a lung infection (pneumonia), if you: Are older than 65. Are pregnant. Have a weakened disease-fighting system (immune system). This includes people who have HIV or AIDS, are on chemotherapy, or are taking medicines that reduce (suppress) the immune system. Have a long-term (chronic) illness, such as heart disease, kidney disease, diabetes, or lung disease. Have a liver disorder. Are severely overweight (morbidly obese). Have anemia. Have asthma. What are the signs or symptoms? Symptoms of this condition usually begin  suddenly and last 4-14 days. These may include: Fever and chills. Headaches, body aches, or muscle aches. Sore throat. Cough. Runny or stuffy (congested) nose. Chest discomfort. Poor appetite. Weakness or fatigue. Dizziness. Nausea or vomiting. How is this diagnosed? This condition may be diagnosed based on: Your symptoms and medical history. A physical exam. Swabbing your nose or throat and testing the fluid for the influenza virus. How is this treated? If the flu is diagnosed early, you can be treated with antiviral medicine that is given by mouth (orally) or through an IV. This can help reduce how severe the illness is and how long it lasts. Taking care of yourself at home can help relieve symptoms. Your health care provider may recommend: Taking over-the-counter medicines. Drinking plenty of fluids. In many cases, the flu goes away on its own. If you have severe symptoms or complications, you may be treated in a hospital. Follow these instructions at home: Activity Rest as needed and get plenty of sleep. Stay home from work or school as told by your health care provider. Unless you are visiting your health care provider, avoid leaving home until your fever has been gone for 24 hours without taking medicine. Eating and drinking Take an oral rehydration solution (ORS). This is a drink that is sold at pharmacies and retail stores. Drink enough fluid to keep your urine pale yellow. Drink clear fluids in small amounts as you are able. Clear fluids include water, ice chips, fruit juice mixed with water, and low-calorie sports drinks. Eat bland, easy-to-digest foods  in small amounts as you are able. These foods include bananas, applesauce, Jardin, lean meats, toast, and crackers. Avoid drinking fluids that contain a lot of sugar or caffeine, such as energy drinks, regular sports drinks, and soda. Avoid alcohol. Avoid spicy or fatty foods. General instructions     Take  over-the-counter and prescription medicines only as told by your health care provider. Use a cool mist humidifier to add humidity to the air in your home. This can make it easier to breathe. When using a cool mist humidifier, clean it daily. Empty the water and replace it with clean water. Cover your mouth and nose when you cough or sneeze. Wash your hands with soap and water often and for at least 20 seconds, especially after you cough or sneeze. If soap and water are not available, use alcohol-based hand sanitizer. Keep all follow-up visits. This is important. How is this prevented?  Get an annual flu shot. This is usually available in late summer, fall, or winter. Ask your health care provider when you should get your flu shot. Avoid contact with people who are sick during cold and flu season. This is generally fall and winter. Contact a health care provider if: You develop new symptoms. You have: Chest pain. Diarrhea. A fever. Your cough gets worse. You produce more mucus. You feel nauseous or you vomit. Get help right away if you: Develop shortness of breath or have difficulty breathing. Have skin or nails that turn a bluish color. Have severe pain or stiffness in your neck. Develop a sudden headache or sudden pain in your face or ear. Cannot eat or drink without vomiting. These symptoms may represent a serious problem that is an emergency. Do not wait to see if the symptoms will go away. Get medical help right away. Call your local emergency services (911 in the [place] Do not drive yourself to the hospital. Summary Influenza, also called "the flu," is a viral infection that primarily affects your respiratory tract. Symptoms of the flu usually begin suddenly and last 4-14 days. Getting an annual flu shot is the best way to prevent getting the flu. Stay home from work or school as told by your health care provider. Unless you are visiting your health care provider, avoid leaving  home until your fever has been gone for 24 hours without taking medicine. Keep all follow-up visits. This is important. This information is not intended to replace advice given to you by your health care provider. Make sure you discuss any questions you have with your health care provider. Document Revised: 10/11/2019 Document Reviewed: 10/11/2019 Elsevier Patient Education  2023 [employer].

## 2022-03-31 ENCOUNTER — Telehealth (INDEPENDENT_AMBULATORY_CARE_PROVIDER_SITE_OTHER): Payer: BC Managed Care – PPO | Admitting: Neurology

## 2022-03-31 DIAGNOSIS — G43909 Migraine, unspecified, not intractable, without status migrainosus: Secondary | ICD-10-CM

## 2022-03-31 DIAGNOSIS — R404 Transient alteration of awareness: Secondary | ICD-10-CM

## 2022-03-31 DIAGNOSIS — G43709 Chronic migraine without aura, not intractable, without status migrainosus: Secondary | ICD-10-CM

## 2022-03-31 MED ORDER — TOPIRAMATE 100 MG PO TABS: 100.0000 mg | ORAL_TABLET | Freq: Two times a day (BID) | ORAL | 3 refills | Status: AC

## 2022-03-31 MED ORDER — SUMATRIPTAN SUCCINATE 50 MG PO TABS: ORAL_TABLET | ORAL | 6 refills | Status: AC

## 2022-03-31 NOTE — Progress Notes (Signed)
Virtual Visit via Video Note  I connected with Crystal Newman on 03/31/22 at  3:15 PM EST by a video enabled telemedicine application and verified that I am speaking with the correct person using two identifiers.  Location: Patient: at work Provider: in the office    I discussed the limitations of evaluation and management by telemedicine and the availability of in person appointments. The patient expressed understanding and agreed to proceed.  History of Present Illness: Crystal Newman is a 32 year old female, seen in request by her primary care nurse practitioner Maximiano Coss, NP And Dr. Wendie Agreste, for evaluation of left-sided numbness, headache, zoning out, initial evaluation was on April 15, 2021 with her husband   I reviewed and summarized the referring note.   She had long history of chronic migraine headaches since elementary school, typical migraine lateralized severe pounding headache with light noise sensitivity, nauseous, alleviated by sleep   Previously she also had passing out spells, has only happened in standing position, often preceded by prodrome of fainting sensation, narrowing down of her vision, heart palpitation, transient loss of consciousness, not necessarily associated with seizure activity, she was diagnosed with possible seizure in her teens, was treated with Keppra and other seizure medicine without helping her symptoms, she also had a cardiac work-up including 1 months Holter monitoring there was no significant abnormality showed, she has not had a seizure activity since 2014  Over the years, for migraine headache has much improved, only has it occasionally.   On April 05, 2021, evening time, she developed left eye twitching, while cooking, she had a sudden onset of left hand and foot numbness, subjective weakness, each episode lasted about few minutes, there was one transient zoning out, she was able to continue cooking, no passing out, then she  developed headache, with mild nausea, went to sleep, next morning, she continued to have head pressure sensation  She had subjective weakness on the left side lasting for about a week, now back to baseline   Update September 15, 2021 SS: Remains on Topamax 100 mg twice daily. 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 None since then. Took her several weeks to get to full dose.    -EEG showed evidence of higher amplitude, slow brain activity following hyperventilation, no clear epileptiform discharge -Ambulatory 24-hour EEG was normal -MRI of the brain with and without contrast was normal  Update March 31, 2022 SS: Doing well on topamax 100 mg twice daily. Only few headaches, took Imitrex 3 times since, it relieves the headaches. No side effects.  No syncope spells.  Overall doing very well.   Observations/Objective: Via virtual visit, is alert and oriented, speech is clear and concise, facial symmetry noted, moves about freely  Assessment and Plan: 1.  Migraine headaches 2.  Passing out spells 3.  Episode of left-sided numbness subjective weakness with headache on April 05, 2021 -Doing very well, no further spells noted on Topamax -Continue Topamax 100 mg twice a day -Continue Imitrex 50 mg as needed for acute headache  Follow Up Instructions: 1 year   I discussed the assessment and treatment plan with the patient. The patient was provided an opportunity to ask questions and all were answered. The patient agreed with the plan and demonstrated an understanding of the  instructions.   The patient was advised to call back or seek an in-person evaluation if the symptoms worsen or if the condition fails to improve as anticipated.   Evangeline Dakin, DNP  [facility] [address] [place], [place] [address] [phone]

## 2022-03-31 NOTE — Patient Instructions (Signed)
Great to see you today, we will continue the Topamax Please let me know if any concerning spells

## 2022-05-02 ENCOUNTER — Encounter: Payer: Self-pay | Admitting: Family Medicine

## 2022-05-02 ENCOUNTER — Ambulatory Visit (INDEPENDENT_AMBULATORY_CARE_PROVIDER_SITE_OTHER): Payer: BC Managed Care – PPO | Admitting: Family Medicine

## 2022-05-02 VITALS — BP 124/70 | HR 81 | Temp 98.3°F | Ht 67.0 in | Wt 183.4 lb

## 2022-05-02 DIAGNOSIS — Z0000 Encounter for general adult medical examination without abnormal findings: Secondary | ICD-10-CM

## 2022-05-02 DIAGNOSIS — Z131 Encounter for screening for diabetes mellitus: Secondary | ICD-10-CM

## 2022-05-02 DIAGNOSIS — Z13220 Encounter for screening for lipoid disorders: Secondary | ICD-10-CM

## 2022-05-02 DIAGNOSIS — Z23 Encounter for immunization: Secondary | ICD-10-CM | POA: Diagnosis not present

## 2022-05-02 DIAGNOSIS — Z130 Encounter for screening for diseases of the blood and blood-forming organs and certain disorders involving the immune mechanism: Secondary | ICD-10-CM

## 2022-05-02 DIAGNOSIS — G43709 Chronic migraine without aura, not intractable, without status migrainosus: Secondary | ICD-10-CM

## 2022-05-02 NOTE — Patient Instructions (Addendum)
Schedule pap/visit with GYN.  If any concerns on labs I will let you know.  Goal exercise 150 minutes per week. Steps at work count.   Here is an option for dentist if needed:  [facility], Dr. Oren Binet Address: [address], [place], [place] [address]  [email or web address]  Phone: [phone]  Thanks for coming in today and take care!  Preventive Care 30-34 Years Old, Female Preventive care refers to lifestyle choices and visits with your health care provider that can promote health and wellness. Preventive care visits are also called wellness exams. What can I expect for my preventive care visit? Counseling During your preventive care visit, your health care provider may ask about your: Medical history, including: Past medical problems. Family medical history. Pregnancy history. Current health, including: Menstrual cycle. Method of birth control. Emotional well-being. Home life and relationship well-being. Sexual activity and sexual health. Lifestyle, including: Alcohol, nicotine or tobacco, and drug use. Access to firearms. Diet, exercise, and sleep habits. Work and work Statistician. Sunscreen use. Safety issues such as seatbelt and bike helmet use. Physical exam Your health care provider may check your: Height and weight. These may be used to calculate your BMI (body mass index). BMI is a measurement that tells if you are at a healthy weight. Waist circumference. This measures the distance around your waistline. This measurement also tells if you are at a healthy weight and may help predict your risk of certain diseases, such as type 2 diabetes and high blood pressure. Heart rate and blood pressure. Body temperature. Skin for abnormal spots. What immunizations do I need?  Vaccines are usually given at various ages, according to a schedule. Your health care provider will recommend vaccines for you based on your age, medical history, and lifestyle or other  factors, such as travel or where you work. What tests do I need? Screening Your health care provider may recommend screening tests for certain conditions. This may include: Pelvic exam and Pap test. Lipid and cholesterol levels. Diabetes screening. This is done by checking your blood sugar (glucose) after you have not eaten for a while (fasting). Hepatitis B test. Hepatitis C test. HIV (human immunodeficiency virus) test. STI (sexually transmitted infection) testing, if you are at risk. BRCA-related cancer screening. This may be done if you have a family history of breast, ovarian, tubal, or peritoneal cancers. Talk with your health care provider about your test results, treatment options, and if necessary, the need for more tests. Follow these instructions at home: Eating and drinking  Eat a healthy diet that includes fresh fruits and vegetables, whole grains, lean protein, and low-fat dairy products. Take vitamin and mineral supplements as recommended by your health care provider. Do not drink alcohol if: Your health care provider tells you not to drink. You are pregnant, may be pregnant, or are planning to become pregnant. If you drink alcohol: Limit how much you have to 0-1 drink a day. Know how much alcohol is in your drink. In the [place] one drink equals one 12 oz bottle of beer (355 mL), one 5 oz glass of wine (148 mL), or one 1 oz glass of hard liquor (44 mL). Lifestyle Brush your teeth every morning and night with fluoride toothpaste. Floss one time each day. Exercise for at least 30 minutes 5 or more days each week. Do not use any products that contain nicotine or tobacco. These products include cigarettes, chewing tobacco, and vaping devices, such as e-cigarettes. If you need help quitting, ask  your health care provider. Do not use drugs. If you are sexually active, practice safe sex. Use a condom or other form of protection to prevent STIs. If you do not wish to become  pregnant, use a form of birth control. If you plan to become pregnant, see your health care provider for a prepregnancy visit. Find healthy ways to manage stress, such as: Meditation, yoga, or listening to music. Journaling. Talking to a trusted person. Spending time with friends and family. Minimize exposure to UV radiation to reduce your risk of skin cancer. Safety Always wear your seat belt while driving or riding in a vehicle. Do not drive: If you have been drinking alcohol. Do not ride with someone who has been drinking. If you have been using any mind-altering substances or drugs. While texting. When you are tired or distracted. Wear a helmet and other protective equipment during sports activities. If you have firearms in your house, make sure you follow all gun safety procedures. Seek help if you have been physically or sexually abused. What's next? Go to your health care provider once a year for an annual wellness visit. Ask your health care provider how often you should have your eyes and teeth checked. Stay up to date on all vaccines. This information is not intended to replace advice given to you by your health care provider. Make sure you discuss any questions you have with your health care provider. Document Revised: 08/19/2020 Document Reviewed: 08/19/2020 Elsevier Patient Education  [place].

## 2022-05-02 NOTE — Progress Notes (Signed)
Subjective:  Patient ID: Crystal Newman, female    DOB: Jul 28, 1990  Age: 32 y.o. MRN: [id number]  CC:  Chief Complaint  Patient presents with   Annual Exam    Pt has had food     HPI Crystal Newman presents for Annual Exam No health changes since last visit.  5 and 32yo at home - doing well.  Slight left knee pain squatting at times.  History of chronic migraine, treated by neurology with Topamax, Imitrex.  Last visit January 25.  Prior history of syncopal spells, migraine headaches and left-sided numbness with subjective weakness last year, no further spells noted on Topamax.  Continue 100 mg twice daily and Imitrex 50 mg as needed for acute headache.  1 year follow-up.  Contraceptive counseling Liletta IUD. Doing well. No plans on pregnancy in next year.  No need for STI screening. GYN - prior Dr. Raphael Gibney, [facility] [place] [facility], due for repeat pap.   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      05/02/2022    4:06 PM 01/17/2022    2:17 PM 11/04/2020    3:55 PM 01/16/2018    3:21 PM  Depression screen PHQ 2/9  Decreased Interest 0 0 0 0  Down, Depressed, Hopeless 0 0 0 0  PHQ - 2 Score 0 0 0 0  Altered sleeping  0 1   Tired, decreased energy  0 2   Change in appetite  0 1   Feeling bad or failure about yourself   0 1   Trouble concentrating  0 1   Moving slowly or fidgety/restless  0 0   Suicidal thoughts  0 0   PHQ-9 Score  0 6     Health Maintenance  Topic Date Due   DTaP/Tdap/Td (1 - Tdap) Never done   PAP SMEAR-Modifier  05/02/2022 (Originally 01/18/2020)   COVID-19 Vaccine (1) 05/18/2022 (Originally 09/08/1990)   INFLUENZA VACCINE  06/05/2022 (Originally 10/05/2021)   Hepatitis C Screening  01/18/2023 (Originally 03/11/2006)   HIV Screening  Completed   HPV VACCINES  Aged Out  Pap - will follow up with gyn.   Immunization History  Administered Date(s) Administered   Influenza,inj,Quad PF,6+ Mos 12/06/2016   MMR 12/06/2016    Pneumococcal Polysaccharide-23 12/06/2016  Flu vaccine - today.  Covid infection 2 weeks ago. Mild. Minimal congestion. Doing well.  No results found.  Optho - wears glasses - exam in past year.   Dental: overdue. Not sure about new insurance coverage.   Alcohol: none  Tobacco: quit smoking 2 years ago. Occasional vaping.   Exercise: minimal.    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   Rubella non-immune status, antepartum 11/18/2014   Previous recurrent miscarriages affecting pregnancy, antepartum 11/18/2014   Hidradenitis 07/21/2011   Past Medical History:  Diagnosis Date   Anxiety    Asthma    sports induced   Gestational hypertension    Headache    Hidradenitis    HSV (herpes simplex virus) anogenital infection    Infection    UTI-frequent   Seizures ([place])    unknown cause, last at age 49  Short PR-normal QRS complex syndrome    Syncope    neurally-mediated   Vitamin D deficiency    Past Surgical History:  Procedure Laterality Date   TONSILLECTOMY     WISDOM TOOTH EXTRACTION     No Known Allergies Prior to Admission medications   Medication Sig Start Date End Date Taking? Authorizing Provider  acetaminophen (TYLENOL) 500 MG tablet Take 1,000 mg by mouth every 6 (six) hours as needed for moderate pain or headache.   Yes [provider]  levonorgestrel (LILETTA, 52 MG,) 20.1 MCG/DAY IUD IUD Liletta 20.4 mcg/24 hrs (8 yrs) 52 mg intrauterine device  Take 1 device by intrauterine route.   Yes [provider]  SUMAtriptan (IMITREX) 50 MG tablet May repeat in 2 hours if headache persists  or recurs. 03/31/22  Yes Suzzanne Cloud, NP  topiramate (TOPAMAX) 100 MG tablet Take 1 tablet (100 mg total) by mouth 2 (two) times daily. 03/31/22  Yes Suzzanne Cloud, NP   Social History   Socioeconomic History   Marital status: Married    Spouse name: Not on file   Number of children: 2   Years of education: some college   Highest education level: High school graduate  Occupational History   Occupation: front desk at [employer]   Smoking status: Former    Packs/day: 0.50    Years: 8.00    Total pack years: 4.00    Types: Cigarettes    Quit date: 10/2020    Years since quitting: 1.5   Smokeless tobacco: Never  Vaping Use   Vaping Use: Never used  Substance and Sexual Activity   Alcohol use: No   Drug use: No   Sexual activity: Yes    Partners: Male    Birth control/protection: None  Other Topics Concern   Not on file  Social History Narrative   Lives at home with family.   Right-handed.   Caffeine use: 6-7 cups per day.   Social Determinants of Health   Financial Resource Strain: Not on file  Food Insecurity: Not on file  Transportation Needs: Not on file  Physical Activity: Not on file  Stress: Not on file  Social Connections: Not on file  Intimate Partner Violence: Not on file    Review of Systems 13 point review of systems per patient health survey noted.  Negative other than as indicated above or in HPI.    Objective:   Vitals:   05/02/22 1609  BP: 124/70  Pulse: 81  Temp: 98.3 F (36.8 C)  TempSrc: Temporal  SpO2: 98%  Weight: 183 lb 6.4 oz (83.2 kg)  Height: '5\' 7"'$  (1.702 m)     Physical Exam Vitals reviewed.  Constitutional:      Appearance: She is well-developed.  HENT:     Head: Normocephalic and atraumatic.     Right Ear: External ear normal.     Left Ear: External ear normal.  Eyes:     Conjunctiva/sclera: Conjunctivae normal.     Pupils: Pupils are equal, round, and reactive to light.  Neck:     Thyroid: No  thyromegaly.  Cardiovascular:     Rate and Rhythm: Normal rate and regular rhythm.     Heart sounds: Normal heart sounds. No murmur heard. Pulmonary:     Effort: Pulmonary effort is normal. No respiratory distress.     Breath sounds: Normal breath sounds. No wheezing.  Abdominal:     General: Bowel sounds are normal.  Palpations: Abdomen is soft.     Tenderness: There is no abdominal tenderness.  Musculoskeletal:        General: No tenderness. Normal range of motion.     Cervical back: Normal range of motion and neck supple.  Lymphadenopathy:     Cervical: No cervical adenopathy.  Skin:    General: Skin is warm and dry.     Findings: No rash.  Neurological:     Mental Status: She is alert and oriented to person, place, and time.  Psychiatric:        Behavior: Behavior normal.        Thought Content: Thought content normal.        Assessment & Plan:  Crystal Newman is a 32 y.o. female . Annual physical exam - Plan: CBC with Differential/Platelet, Comprehensive metabolic panel, Lipid panel, Hemoglobin A1c  - -anticipatory guidance as below in AVS, screening labs above. Health maintenance items as above in HPI discussed/recommended as applicable.   - follow up with gyn, dentist recommended. Flu vaccine given.   Screening for deficiency anemia - Plan: CBC with Differential/Platelet  Screening for diabetes mellitus - Plan: Comprehensive metabolic panel, Hemoglobin A1c  Screening for hyperlipidemia - Plan: Comprehensive metabolic panel, Lipid panel  Needs flu shot - Plan: Flu Vaccine QUAD 72moIM (Fluarix, Fluzone & Alfiuria Quad PF)  Chronic migraine w/o aura w/o status migrainosus, not intractable  - stable control with topamax, imitrex as needed. Follow up with neuro as planned.   No orders of the defined types were placed in this encounter.  Patient Instructions  Schedule pap/visit with GYN.  If any concerns on labs I will let you know.  Goal exercise 150 minutes  per week. Steps at work count.   Here is an option for dentist if needed:  [facility], Dr. BOren BinetAddress: [address] [place] [address] [email or web address]  Phone: [phone] Thanks for coming in today and take care!  Preventive Care 220345Years Old, Female Preventive care refers to lifestyle choices and visits with your health care provider that can promote health and wellness. Preventive care visits are also called wellness exams. What can I expect for my preventive care visit? Counseling During your preventive care visit, your health care provider may ask about your: Medical history, including: Past medical problems. Family medical history. Pregnancy history. Current health, including: Menstrual cycle. Method of birth control. Emotional well-being. Home life and relationship well-being. Sexual activity and sexual health. Lifestyle, including: Alcohol, nicotine or tobacco, and drug use. Access to firearms. Diet, exercise, and sleep habits. Work and work eStatistician Sunscreen use. Safety issues such as seatbelt and bike helmet use. Physical exam Your health care provider may check your: Height and weight. These may be used to calculate your BMI (body mass index). BMI is a measurement that tells if you are at a healthy weight. Waist circumference. This measures the distance around your waistline. This measurement also tells if you are at a healthy weight and may help predict your risk of certain diseases, such as type 2 diabetes and high blood pressure. Heart rate and blood pressure. Body temperature. Skin for abnormal spots. What immunizations do I need?  Vaccines are usually given at various ages, according to a schedule. Your health care provider will recommend vaccines for you based on your age, medical history, and lifestyle or other factors, such as travel or where you work. What tests do I need? Screening Your health care provider may  recommend screening tests for certain conditions. This may include: Pelvic exam and Pap test. Lipid and cholesterol levels. Diabetes screening. This is done by checking your blood sugar (glucose) after you have not eaten for a while (fasting). Hepatitis B test. Hepatitis C test. HIV (human immunodeficiency virus) test. STI (sexually transmitted infection) testing, if you are at risk. BRCA-related cancer screening. This may be done if you have a family history of breast, ovarian, tubal, or peritoneal cancers. Talk with your health care provider about your test results, treatment options, and if necessary, the need for more tests. Follow these instructions at home: Eating and drinking  Eat a healthy diet that includes fresh fruits and vegetables, whole grains, lean protein, and low-fat dairy products. Take vitamin and mineral supplements as recommended by your health care provider. Do not drink alcohol if: Your health care provider tells you not to drink. You are pregnant, may be pregnant, or are planning to become pregnant. If you drink alcohol: Limit how much you have to 0-1 drink a day. Know how much alcohol is in your drink. In the [place] one drink equals one 12 oz bottle of beer (355 mL), one 5 oz glass of wine (148 mL), or one 1 oz glass of hard liquor (44 mL). Lifestyle Brush your teeth every morning and night with fluoride toothpaste. Floss one time each day. Exercise for at least 30 minutes 5 or more days each week. Do not use any products that contain nicotine or tobacco. These products include cigarettes, chewing tobacco, and vaping devices, such as e-cigarettes. If you need help quitting, ask your health care provider. Do not use drugs. If you are sexually active, practice safe sex. Use a condom or other form of protection to prevent STIs. If you do not wish to become pregnant, use a form of birth control. If you plan to become pregnant, see your health care provider for a  prepregnancy visit. Find healthy ways to manage stress, such as: Meditation, yoga, or listening to music. Journaling. Talking to a trusted person. Spending time with friends and family. Minimize exposure to UV radiation to reduce your risk of skin cancer. Safety Always wear your seat belt while driving or riding in a vehicle. Do not drive: If you have been drinking alcohol. Do not ride with someone who has been drinking. If you have been using any mind-altering substances or drugs. While texting. When you are tired or distracted. Wear a helmet and other protective equipment during sports activities. If you have firearms in your house, make sure you follow all gun safety procedures. Seek help if you have been physically or sexually abused. What's next? Go to your health care provider once a year for an annual wellness visit. Ask your health care provider how often you should have your eyes and teeth checked. Stay up to date on all vaccines. This information is not intended to replace advice given to you by your health care provider. Make sure you discuss any questions you have with your health care provider. Document Revised: 08/19/2020 Document Reviewed: 08/19/2020 Elsevier Patient Education  [place],   Merri Ray, MD [place] 05/02/22 5:02 PM

## 2022-05-03 LAB — LIPID PANEL
Cholesterol: 140 mg/dL (ref 0–200)
HDL: 43.4 mg/dL (ref >39.00)
LDL Cholesterol: 79 mg/dL (ref 0–99)
NonHDL: 96.81
Total CHOL/HDL Ratio: 3
Triglycerides: 87 mg/dL (ref 0.0–149.0)
VLDL: 17.4 mg/dL (ref 0.0–40.0)

## 2022-05-03 LAB — CBC WITH DIFFERENTIAL/PLATELET
Basophils Absolute: 0.1 10*3/uL (ref 0.0–0.1)
Basophils Relative: 0.8 % (ref 0.0–3.0)
Eosinophils Absolute: 0.1 10*3/uL (ref 0.0–0.7)
Eosinophils Relative: 0.7 % (ref 0.0–5.0)
HCT: 39.2 % (ref 36.0–46.0)
Hemoglobin: 13.1 g/dL (ref 12.0–15.0)
Lymphocytes Relative: 24.9 % (ref 12.0–46.0)
Lymphs Abs: 2.2 10*3/uL (ref 0.7–4.0)
MCHC: 33.3 g/dL (ref 30.0–36.0)
MCV: 91.1 fl (ref 78.0–100.0)
Monocytes Absolute: 0.5 10*3/uL (ref 0.1–1.0)
Monocytes Relative: 6 % (ref 3.0–12.0)
Neutro Abs: 6 10*3/uL (ref 1.4–7.7)
Neutrophils Relative %: 67.6 % (ref 43.0–77.0)
Platelets: 280 10*3/uL (ref 150.0–400.0)
RBC: 4.31 Mil/uL (ref 3.87–5.11)
RDW: 12.9 % (ref 11.5–15.5)
WBC: 8.9 10*3/uL (ref 4.0–10.5)

## 2022-05-03 LAB — COMPREHENSIVE METABOLIC PANEL
ALT: 9 U/L (ref 0–35)
AST: 16 U/L (ref 0–37)
Albumin: 4 g/dL (ref 3.5–5.2)
Alkaline Phosphatase: 60 U/L (ref 39–117)
BUN: 14 mg/dL (ref 6–23)
CO2: 28 mEq/L (ref 19–32)
Calcium: 9.3 mg/dL (ref 8.4–10.5)
Chloride: 102 mEq/L (ref 96–112)
Creatinine, Ser: 0.78 mg/dL (ref 0.40–1.20)
GFR: 99.29 mL/min (ref >60.00)
Glucose, Bld: 77 mg/dL (ref 70–99)
Potassium: 4.2 mEq/L (ref 3.5–5.1)
Sodium: 136 mEq/L (ref 135–145)
Total Bilirubin: 0.8 mg/dL (ref 0.2–1.2)
Total Protein: 6.7 g/dL (ref 6.0–8.3)

## 2022-05-03 LAB — HEMOGLOBIN A1C: Hgb A1c MFr Bld: 5.3 % (ref 4.6–6.5)

## 2022-06-08 ENCOUNTER — Ambulatory Visit (INDEPENDENT_AMBULATORY_CARE_PROVIDER_SITE_OTHER): Payer: BC Managed Care – PPO | Admitting: Family Medicine

## 2022-06-08 VITALS — BP 110/82 | HR 90 | Temp 98.4°F | Ht 67.0 in | Wt 183.4 lb

## 2022-06-08 DIAGNOSIS — N611 Abscess of the breast and nipple: Secondary | ICD-10-CM | POA: Diagnosis not present

## 2022-06-08 DIAGNOSIS — N644 Mastodynia: Secondary | ICD-10-CM

## 2022-06-08 MED ORDER — DOXYCYCLINE HYCLATE 100 MG PO TABS: 100.0000 mg | ORAL_TABLET | Freq: Two times a day (BID) | ORAL | 0 refills | Status: DC

## 2022-06-08 MED ORDER — HYDROCODONE-ACETAMINOPHEN 5-325 MG PO TABS: 1.0000 | ORAL_TABLET | Freq: Four times a day (QID) | ORAL | 0 refills | Status: DC | PRN

## 2022-06-08 MED ORDER — CEFTRIAXONE SODIUM 1 G IJ SOLR
1.0000 g | Freq: Once | INTRAMUSCULAR | Status: AC
  Administered 2022-06-08: 1 g via INTRAMUSCULAR

## 2022-06-08 NOTE — Patient Instructions (Addendum)
Okay to continue warm compresses.  Start antibiotic pill either tonight or tomorrow morning, antibiotic injection given today.  Hydrocodone if needed for pain.  I will try to get you an appointment with one of the surgeons tomorrow.  You should hear from them either later today or tomorrow morning.   Return to the clinic or go to the nearest emergency room if any of your symptoms worsen or new symptoms occur.  Skin Abscess  A skin abscess is an infected area on or under your skin. It contains pus and other material. An abscess may also be called a furuncle, carbuncle, or boil. It is often the result of an infection caused by bacteria. An abscess can occur in or on almost any part of your body. Sometimes, an abscess may break open (rupture) on its own. In most cases, it will keep getting worse unless it is treated. An abscess can cause pain and make you feel ill. An untreated abscess can cause infection to spread to other parts of your body or your bloodstream. The abscess may need to be drained. You may also need to take antibiotics. What are the causes? An abscess occurs when germs, like bacteria, pass through your skin and cause an infection. This may be caused by: A scrape or cut on your skin. A puncture wound through your skin, such as a needle injection or insect bite. Blocked oil or sweat glands. Blocked and infected hair follicles. A fluid-filled sac that forms beneath your skin (sebaceous cyst) and becomes infected. What increases the risk? You may be more likely to develop an abscess if: You have problems with blood circulation, or you have a weak body defense system (immune system). You have diabetes. You have dry and irritated skin. You get injections often or use IV drugs. You have a foreign body in a wound, such as a splinter. You smoke or use tobacco products. What are the signs or symptoms? Symptoms of this condition include: A painful, firm bump under the skin. A bump with  pus at the top. This may break through the skin and drain. Other symptoms include: Redness and swelling around the abscess. Warmth or tenderness. Swelling of the lymph nodes (glands) near the abscess. A sore on the skin. How is this diagnosed? This condition may be diagnosed based on a physical exam and your medical history. You may also have tests done, such as: A test of a sample of pus. This may be done to find what is causing the infection. Blood tests. Imaging tests, such as an ultrasound, CT scan, or MRI. How is this treated? A small abscess that drains on its own may not need to be treated. Treatment for larger abscesses may include: Moist heat or a heat pack applied to the area a few times a day. Incision and drainage. This is a procedure to drain the abscess. Antibiotics. For a severe abscess, you may first get antibiotics through an IV and then change to antibiotics by mouth. Follow these instructions at home: Medicines Take over-the-counter and prescription medicines only as told by your provider. If you were prescribed antibiotics, take them as told by your provider. Do not stop using the antibiotic even if you start to feel better. Abscess care  If you have an abscess that has not drained, apply heat to the affected area. Use the heat source that your provider recommends, such as a moist heat pack or a heating pad. Place a towel between your skin and the heat  source. Leave the heat on for 20-30 minutes at a time. If your skin turns bright red, remove the heat right away to prevent burns. The risk of burns is higher if you cannot feel pain, heat, or cold. Follow instructions from your provider about how to take care of your abscess. Make sure you: Cover the abscess with a bandage (dressing). Wash your hands with soap and water for at least 20 seconds before and after you change the dressing or gauze. If soap and water are not available, use hand sanitizer. Change your  dressing or gauze as told by your provider. Check your abscess every day for signs of an infection that is getting worse. Check for: More redness, swelling, pain, or tenderness. More fluid or blood. Warmth. More pus or a worse smell. General instructions To avoid spreading the infection: Do not share personal care items, towels, or hot tubs with others. Avoid making skin contact with other people. Be careful when getting rid of used dressings, wound packing, or any drainage from the abscess. Do not use any products that contain nicotine or tobacco. These products include cigarettes, chewing tobacco, and vaping devices, such as e-cigarettes. If you need help quitting, ask your provider. Do not use any creams, ointments, or liquids unless you have been told to by your provider. Contact a health care provider if: You see redness that spreads quickly or red streaks on your skin spreading away from the abscess. You have any signs of worse infection at the abscess. You vomit every time you eat or drink. You have a fever, chills, or muscle aches. The cyst or abscess returns. Get help right away if: You have severe pain. You make less pee (urine) than normal. This information is not intended to replace advice given to you by your health care provider. Make sure you discuss any questions you have with your health care provider. Document Revised: 10/06/2021 Document Reviewed: 10/06/2021 Elsevier Patient Education  [place].

## 2022-06-08 NOTE — Progress Notes (Signed)
Subjective:  Patient ID: Crystal Newman, female    DOB: May 03, 1990  Age: 32 y.o. MRN: [id number]  CC:  Chief Complaint  Patient presents with   Breast Mass    Pt states that she has a lump with redness and swelling on left breast. Pt states she has Hidradenitis but have never had an issue with it in her breast. Pain and swelling stated last Thursday.     HPI LOEVA MEANOR presents for    L breast mass: Started about 2-3 weeks ago. Small pimple/bump, some drainage. Has increased in size over past 6 days.  No measured fever but some chills at times past few days. Stopped draining pus since about a week -10 days ago.  Hx of hidradenitis in groin, buttocks, not in breast. Dermatologist in [place].  No nipple discharge.  Tx: hot compress.   No known true seizures, workup for in past negative.  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   Rubella non-immune status, antepartum 11/18/2014   Previous recurrent miscarriages affecting pregnancy, antepartum 11/18/2014   Hidradenitis 07/21/2011   Past Medical History:  Diagnosis Date   Anxiety    Asthma    sports induced   Gestational hypertension    Headache    Hidradenitis    HSV (herpes simplex virus) anogenital infection    Infection    UTI-frequent   Seizures    unknown cause, last at age 30   Short PR-normal QRS complex syndrome    Syncope    neurally-mediated   Vitamin D deficiency    Past Surgical History:  Procedure Laterality Date   TONSILLECTOMY     WISDOM TOOTH EXTRACTION     No Known Allergies Prior to Admission medications   Medication Sig Start Date  End Date Taking? Authorizing Provider  acetaminophen (TYLENOL) 500 MG tablet Take 1,000 mg by mouth every 6 (six) hours as needed for moderate pain or headache.   Yes [provider]  levonorgestrel (LILETTA, 52 MG,) 20.1 MCG/DAY IUD IUD Liletta 20.4 mcg/24 hrs (8 yrs) 52 mg intrauterine device  Take 1 device by intrauterine route.   Yes [provider]  SUMAtriptan (IMITREX) 50 MG tablet May repeat in 2 hours if headache persists or recurs. 03/31/22  Yes Suzzanne Cloud, NP  topiramate (TOPAMAX) 100 MG tablet Take 1 tablet (100 mg total) by mouth 2 (two) times daily. 03/31/22  Yes Suzzanne Cloud, NP   Social History   Socioeconomic History   Marital status: Married    Spouse name: Not on file   Number of children: 2   Years of education: some college   Highest education level: Some college, no degree  Occupational History   Occupation: front desk at [employer]   Smoking status: Former    Packs/day: 0.50    Years: 8.00    Additional pack years: 0.00    Total pack years: 4.00    Types: Cigarettes    Quit date: 10/2020    Years since quitting: 1.6   Smokeless tobacco: Never  Vaping Use   Vaping Use: Never used  Substance and Sexual Activity  Alcohol use: No   Drug use: No   Sexual activity: Yes    Partners: Male    Birth control/protection: None  Other Topics Concern   Not on file  Social History Narrative   Lives at home with family.   Right-handed.   Caffeine use: 6-7 cups per day.   Social Determinants of Health   Financial Resource Strain: Medium Risk (06/08/2022)   Overall Financial Resource Strain (CARDIA)    Difficulty of Paying Living Expenses: Somewhat hard  Food Insecurity: Patient Declined (06/08/2022)   Hunger Vital Sign    Worried About Running Out of Food in the Last Year: Patient declined    [place] in the Last Year: Patient declined  Transportation Needs: No Transportation Needs (06/08/2022)   PRAPARE - Armed forces logistics/support/administrative officer (Medical): No    Lack of Transportation (Non-Medical): No  Physical Activity: Unknown (06/08/2022)   Exercise Vital Sign    Days of Exercise per Week: 0 days    Minutes of Exercise per Session: Not on file  Stress: No Stress Concern Present (06/08/2022)   [place]    Feeling of Stress : Not at all  Social Connections: [place] (06/08/2022)   Social Connection and Isolation Panel [NHANES]    Frequency of Communication with Friends and Family: More than three times a week    Frequency of Social Gatherings with Friends and Family: More than three times a week    Attends Religious Services: More than 4 times per year    Active Member of [employer] or Organizations: Yes    Attends Music therapist: More than 4 times per year    Marital Status: Married  Human resources officer Violence: Not on file    Review of Systems Per HPI   Objective:   Vitals:   06/08/22 1458  BP: 110/82  Pulse: 90  Temp: 98.4 F (36.9 C)  TempSrc: Temporal  SpO2: 98%  Weight: 183 lb 6.4 oz (83.2 kg)  Height: 5\' 7"  (1.702 m)     Physical Exam Vitals reviewed. Exam conducted with a chaperone present [facility]).  Constitutional:      General: She is not in acute distress.    Appearance: Normal appearance. She is well-developed. She is not ill-appearing, toxic-appearing or diaphoretic.  HENT:     Head: Normocephalic and atraumatic.  Cardiovascular:     Rate and Rhythm: Normal rate.  Pulmonary:     Effort: Pulmonary effort is normal.  Chest:    Neurological:     Mental Status: She is alert and oriented to person, place, and time.  Psychiatric:        Mood and Affect: Mood normal.      Assessment & Plan:  Crystal Newman is a 32 y.o. female . Left breast abscess - Plan: HYDROcodone-acetaminophen (NORCO/VICODIN) 5-325 MG tablet, Ambulatory referral to General Surgery, doxycycline (VIBRA-TABS) 100  MG tablet, cefTRIAXone (ROCEPHIN) injection 1 g  Breast pain - Plan: HYDROcodone-acetaminophen (NORCO/VICODIN) 5-325 MG tablet  Left breast abscess with central cellulitis.  Afebrile, subjective chills.  Indurated area but no exudate expressed.  Rocephin 1 g given, start doxycycline, urgent referral to general surgery tomorrow.  Overnight ER precautions given.  Hydrocodone if needed for pain.  Potential side effects of medications discussed.  Meds ordered this encounter  Medications   HYDROcodone-acetaminophen (NORCO/VICODIN) 5-325 MG tablet    Sig: Take 1 tablet by mouth every 6 (  six) hours as needed for moderate pain.    Dispense:  20 tablet    Refill:  0   doxycycline (VIBRA-TABS) 100 MG tablet    Sig: Take 1 tablet (100 mg total) by mouth 2 (two) times daily.    Dispense:  20 tablet    Refill:  0   cefTRIAXone (ROCEPHIN) injection 1 g   Patient Instructions  Okay to continue warm compresses.  Start antibiotic pill either tonight or tomorrow morning, antibiotic injection given today.  Hydrocodone if needed for pain.  I will try to get you an appointment with one of the surgeons tomorrow.  You should hear from them either later today or tomorrow morning.   Return to the clinic or go to the nearest emergency room if any of your symptoms worsen or new symptoms occur.  Skin Abscess  A skin abscess is an infected area on or under your skin. It contains pus and other material. An abscess may also be called a furuncle, carbuncle, or boil. It is often the result of an infection caused by bacteria. An abscess can occur in or on almost any part of your body. Sometimes, an abscess may break open (rupture) on its own. In most cases, it will keep getting worse unless it is treated. An abscess can cause pain and make you feel ill. An untreated abscess can cause infection to spread to other parts of your body or your bloodstream. The abscess may need to be drained. You may also need to take  antibiotics. What are the causes? An abscess occurs when germs, like bacteria, pass through your skin and cause an infection. This may be caused by: A scrape or cut on your skin. A puncture wound through your skin, such as a needle injection or insect bite. Blocked oil or sweat glands. Blocked and infected hair follicles. A fluid-filled sac that forms beneath your skin (sebaceous cyst) and becomes infected. What increases the risk? You may be more likely to develop an abscess if: You have problems with blood circulation, or you have a weak body defense system (immune system). You have diabetes. You have dry and irritated skin. You get injections often or use IV drugs. You have a foreign body in a wound, such as a splinter. You smoke or use tobacco products. What are the signs or symptoms? Symptoms of this condition include: A painful, firm bump under the skin. A bump with pus at the top. This may break through the skin and drain. Other symptoms include: Redness and swelling around the abscess. Warmth or tenderness. Swelling of the lymph nodes (glands) near the abscess. A sore on the skin. How is this diagnosed? This condition may be diagnosed based on a physical exam and your medical history. You may also have tests done, such as: A test of a sample of pus. This may be done to find what is causing the infection. Blood tests. Imaging tests, such as an ultrasound, CT scan, or MRI. How is this treated? A small abscess that drains on its own may not need to be treated. Treatment for larger abscesses may include: Moist heat or a heat pack applied to the area a few times a day. Incision and drainage. This is a procedure to drain the abscess. Antibiotics. For a severe abscess, you may first get antibiotics through an IV and then change to antibiotics by mouth. Follow these instructions at home: Medicines Take over-the-counter and prescription medicines only as told by your provider. If  you were prescribed antibiotics, take them as told by your provider. Do not stop using the antibiotic even if you start to feel better. Abscess care  If you have an abscess that has not drained, apply heat to the affected area. Use the heat source that your provider recommends, such as a moist heat pack or a heating pad. Place a towel between your skin and the heat source. Leave the heat on for 20-30 minutes at a time. If your skin turns bright red, remove the heat right away to prevent burns. The risk of burns is higher if you cannot feel pain, heat, or cold. Follow instructions from your provider about how to take care of your abscess. Make sure you: Cover the abscess with a bandage (dressing). Wash your hands with soap and water for at least 20 seconds before and after you change the dressing or gauze. If soap and water are not available, use hand sanitizer. Change your dressing or gauze as told by your provider. Check your abscess every day for signs of an infection that is getting worse. Check for: More redness, swelling, pain, or tenderness. More fluid or blood. Warmth. More pus or a worse smell. General instructions To avoid spreading the infection: Do not share personal care items, towels, or hot tubs with others. Avoid making skin contact with other people. Be careful when getting rid of used dressings, wound packing, or any drainage from the abscess. Do not use any products that contain nicotine or tobacco. These products include cigarettes, chewing tobacco, and vaping devices, such as e-cigarettes. If you need help quitting, ask your provider. Do not use any creams, ointments, or liquids unless you have been told to by your provider. Contact a health care provider if: You see redness that spreads quickly or red streaks on your skin spreading away from the abscess. You have any signs of worse infection at the abscess. You vomit every time you eat or drink. You have a fever,  chills, or muscle aches. The cyst or abscess returns. Get help right away if: You have severe pain. You make less pee (urine) than normal. This information is not intended to replace advice given to you by your health care provider. Make sure you discuss any questions you have with your health care provider. Document Revised: 10/06/2021 Document Reviewed: 10/06/2021 Elsevier Patient Education  [place],   Merri Ray, MD [place] 06/08/22 3:52 PM

## 2022-06-09 DIAGNOSIS — N611 Abscess of the breast and nipple: Secondary | ICD-10-CM | POA: Diagnosis not present

## 2022-06-15 ENCOUNTER — Other Ambulatory Visit: Payer: Self-pay | Admitting: Student

## 2022-06-15 DIAGNOSIS — N611 Abscess of the breast and nipple: Secondary | ICD-10-CM

## 2022-06-28 ENCOUNTER — Ambulatory Visit
Admission: RE | Admit: 2022-06-28 | Discharge: 2022-06-28 | Disposition: A | Discharge status: Home / Self Care | Payer: BC Managed Care – PPO | Source: Ambulatory Visit | Attending: Student | Admitting: Student

## 2022-06-28 DIAGNOSIS — N611 Abscess of the breast and nipple: Secondary | ICD-10-CM

## 2022-06-28 DIAGNOSIS — R92323 Mammographic fibroglandular density, bilateral breasts: Secondary | ICD-10-CM | POA: Diagnosis not present

## 2022-06-28 DIAGNOSIS — N6489 Other specified disorders of breast: Secondary | ICD-10-CM | POA: Diagnosis not present

## 2022-07-20 DIAGNOSIS — R109 Unspecified abdominal pain: Secondary | ICD-10-CM | POA: Insufficient documentation

## 2022-07-20 DIAGNOSIS — N898 Other specified noninflammatory disorders of vagina: Secondary | ICD-10-CM | POA: Insufficient documentation

## 2022-07-20 DIAGNOSIS — N926 Irregular menstruation, unspecified: Secondary | ICD-10-CM | POA: Insufficient documentation

## 2022-07-20 DIAGNOSIS — M545 Low back pain, unspecified: Secondary | ICD-10-CM | POA: Insufficient documentation

## 2022-07-20 DIAGNOSIS — N762 Acute vulvitis: Secondary | ICD-10-CM | POA: Insufficient documentation

## 2022-07-20 DIAGNOSIS — M5430 Sciatica, unspecified side: Secondary | ICD-10-CM | POA: Insufficient documentation

## 2022-08-22 ENCOUNTER — Ambulatory Visit (INDEPENDENT_AMBULATORY_CARE_PROVIDER_SITE_OTHER): Payer: BC Managed Care – PPO | Admitting: Family Medicine

## 2022-08-22 VITALS — BP 128/74 | HR 77 | Temp 97.8°F | Ht 67.0 in | Wt 189.6 lb

## 2022-08-22 DIAGNOSIS — M79604 Pain in right leg: Secondary | ICD-10-CM | POA: Diagnosis not present

## 2022-08-22 DIAGNOSIS — S76311A Strain of muscle, fascia and tendon of the posterior muscle group at thigh level, right thigh, initial encounter: Secondary | ICD-10-CM

## 2022-08-22 NOTE — Patient Instructions (Addendum)
Unfortunately I do think you tore one of the hamstring muscles.  There are different levels of tears and that is difficult to tell that just on exam sometimes.  Ultrasound or MRI can be helpful on determining to what degree that muscle may be torn and then can also help determine treatment.  I will refer you to [facility] for further evaluation and likely ultrasound imaging.  They can also discuss if MRI is needed but likely will start with ultrasound.   For now okay to use Ace bandage for compression or an over-the-counter sleeve can also be helpful. Short-term over-the-counter ibuprofen or Aleve up to 1 week is okay for now if needed for pain.  Initially can restrict the amount of motion at the knee for the first week, then likely will start some stretches and range of motion.   Hang in there!  Hamstring Strain  A hamstring strain happens when the muscles in the back of the thighs (hamstring muscles) are overstretched or torn. The hamstring muscles are used in straightening the hips, bending the knees, and pulling back the legs. This injury is often called a pulled hamstring muscle. The tissue that connects the muscle to a bone (tendon) may also be affected. The severity of a hamstring strain may be rated in degrees or grades. First-degree (or grade 1) strains have the least amount of muscle tearing and pain. Second-degree and third-degree (grade 2 and 3) strains have increasingly more tearing and pain. What are the causes? This condition is caused by a sudden, violent force being placed on the hamstring muscles, stretching them too far. This often happens during activities that involve sprinting, jumping, kicking, or weight lifting. What increases the risk? Hamstring strains are especially common in athletes. The following factors may also make you more likely to develop this condition: Having low strength, endurance, or flexibility of the hamstring muscles. Doing high-impact physical  activity or sports. Having poor physical fitness. Having a previous leg injury. Having tired (fatigued) muscles. Having a previous hamstring strain. What are the signs or symptoms? Symptoms of this condition include: Pain in the back of the thigh or buttocks. Swelling. Bruising. Muscle spasms. Trouble moving the affected muscle because of pain. For severe strains, you may feel popping or snapping in the back of your thigh when the injury occurs. How is this diagnosed? This condition is diagnosed based on your symptoms, your medical history, and a physical exam. You may also have imaging tests, such as an MRI or X-rays. Your strain may be rated based on how severe it is. The ratings are: Grade 1 strain (mild). Muscles are overstretched. There may be very small muscle tears. Grade 2 strain (moderate). Muscles are partially torn. Grade 3 strain (severe). Muscles are completely torn. How is this treated? Treatment for this condition usually involves: Protecting, resting, icing, applying compression, and elevating the injured area (PRICE therapy). Medicines. Your health care provider may recommend medicines to help reduce pain or inflammation. Doing exercises to regain strength and flexibility in the muscles. Your health care provider will tell you when it is okay to begin exercising. Hamstring strains may take a long time to heal. This type of strain may happen again in athletes. Follow your health care provider's advice about when to return to sports-related activities. Follow these instructions at home: PRICE therapy Use PRICE therapy to promote muscle healing during the first 2-3 days after your injury, or as told by your health care provider. Protect the muscle from  being injured again. Rest your injury. This usually involves limiting your normal activities and not using the injured hamstring muscle. Talk with your health care provider about how you should limit your activities. Apply  ice to the injured area: Put ice in a plastic bag. Place a towel between your skin and the bag. Leave the ice on for 20 minutes, 2-3 times a day. After the third day, switch to applying heat as told. Put pressure (compression) on your injured hamstring by wrapping it with an elastic bandage. Be careful not to wrap it too tightly. That may interfere with blood circulation or may increase swelling. Raise (elevate) your injured hamstring above the level of your heart as often as possible. When you are lying down, you can do this by putting a pillow under your thigh.  Activity Begin exercising or stretching only as told by your health care provider. Do not return to full activity level until your health care provider approves. To help prevent muscle strains in the future, always warm up before exercising and stretch afterward. General instructions Take over-the-counter and prescription medicines only as told by your health care provider. If directed, apply heat to the affected area as often as told by your health care provider. Use the heat source that your health care provider recommends, such as a moist heat pack or a heating pad. Place a towel between your skin and the heat source. Leave the heat on for 20-30 minutes. Remove the heat if your skin turns bright red. This is especially important if you are unable to feel pain, heat, or cold. You may have a greater risk of getting burned. Keep all follow-up visits. This is important. Contact a health care provider if: You have increasing pain or swelling in the injured area. You have numbness, tingling, or a significant loss of strength in the injured area. Get help right away if: Your foot or your toes become cold or turn blue. Summary A hamstring strain happens when the muscles in the back of the thighs (hamstring muscles) are overstretched or torn. This injury can be caused by a sudden, violent force being placed on the hamstring muscles,  causing them to stretch too far. Symptoms include pain, swelling, and muscle spasms in the injured area. Treatment includes PRICE therapy: protecting, resting, icing, applying compression, and elevating the injured area. This information is not intended to replace advice given to you by your health care provider. Make sure you discuss any questions you have with your health care provider. Document Revised: 07/23/2020 Document Reviewed: 07/23/2020 Elsevier Patient Education  2024 [employer].

## 2022-08-22 NOTE — Progress Notes (Unsigned)
   Rubin Payor, PhD, LAT, ATC acting as a scribe for Crystal Graham, MD.  Crystal Newman is a 32 y.o. female who presents to [facility] at [facility] today for R thigh pain ongoing since Saturday. MOI: She did a splint (R leg forward) and felt a "pop" in the posterior aspect of her R thigh. Pt locates pain to ***   R leg swelling: Aggravates: Treatments tried:  Pertinent review of systems: ***  Relevant historical information: ***   Exam:  There were no vitals taken for this visit. General: Well Developed, well nourished, and in no acute distress.   MSK: ***    Lab and Radiology Results No results found for this or any previous visit (from the past 72 hour(s)). No results found.     Assessment and Plan: 32 y.o. female with ***   PDMP not reviewed this encounter. No orders of the defined types were placed in this encounter.  No orders of the defined types were placed in this encounter.    Discussed warning signs or symptoms. Please see discharge instructions. Patient expresses understanding.   ***

## 2022-08-22 NOTE — Progress Notes (Unsigned)
Subjective:  Patient ID: Crystal Newman, female    DOB: 03-Jun-1990  Age: 32 y.o. MRN: [id number]  CC:  Chief Complaint  Patient presents with   Leg Pain    Pt recently did the splits on Saturday notes a popping feeling and sound on Rt knee notes pain has worsened painful with movements     HPI Crystal Newman presents for   R knee pain: 2 days ago. Performed a split - R leg forward, left leg back. Felt pop on posterior thigh and inside portion, just above knee, noted pain in affected area immediately.  Able to WB, but guarded. Swelling on inside lower thigh, no bruising. Some soreness in knee and calf with change in activity/limping past day. Not initially.  No distal ankle/foot symptoms.  Ambulating without assistive device.  Tx: Few ibuprofen.  Moderate relief.  Activity modification.  Works at ophthalmology.    History Patient Active Problem List   Diagnosis Date Noted   Abdominal pain 07/20/2022   Acute sciatica 07/20/2022   Irregular periods 07/20/2022   Low back pain 07/20/2022   Missed period 07/20/2022   Pruritus of vagina 07/20/2022   Acute vulvitis 07/20/2022   Paresthesia 04/15/2021   Chronic migraine w/o aura w/o status migrainosus, not intractable 04/15/2021   Alteration consciousness 04/15/2021   Pregnancy induced hypertension, third trimester 12/05/2016   Proteinuria 11/21/2016   Smoker 08/28/2016   Vitamin D deficiency 07/25/2016   History of 5 spontaneous abortions 07/22/2016   Herpes simplex 01/08/2015   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   Rubella non-immune status, antepartum 11/18/2014   Previous recurrent miscarriages affecting pregnancy, antepartum 11/18/2014   Threatened miscarriage 06/05/2014   Hidradenitis 07/21/2011   Past Medical History:  Diagnosis Date   Anxiety    Asthma    sports induced   Gestational hypertension    Headache    Hidradenitis    HSV (herpes simplex virus) anogenital infection    Infection    UTI-frequent   Seizures (HCC)     unknown cause, last at age 82   Short PR-normal QRS complex syndrome    Syncope    neurally-mediated   Vitamin D deficiency    Past Surgical History:  Procedure Laterality Date   TONSILLECTOMY     WISDOM TOOTH EXTRACTION     No Known Allergies Prior to Admission medications   Medication Sig Start Date End Date Taking? Authorizing Provider  acetaminophen (TYLENOL) 500 MG tablet Take 1,000 mg by mouth every 6 (six) hours as needed for moderate pain or headache.   Yes [provider]  doxycycline (VIBRA-TABS) 100 MG tablet Take 1 tablet (100 mg total) by mouth 2 (two) times daily. 06/08/22  Yes Shade Flood, MD  HYDROcodone-acetaminophen (NORCO/VICODIN) 5-325 MG tablet Take 1 tablet by mouth every 6 (six) hours as needed for moderate pain. 06/08/22  Yes Shade Flood, MD  levonorgestrel (LILETTA, 52 MG,) 20.1 MCG/DAY IUD IUD Liletta 20.4 mcg/24 hrs (8 yrs) 52 mg intrauterine device  Take 1 device by intrauterine route.   Yes [provider]  SUMAtriptan (IMITREX) 50 MG tablet May repeat in 2 hours if headache persists or recurs. 03/31/22  Yes Glean Salvo, NP  topiramate (TOPAMAX) 100 MG tablet Take 1 tablet (100 mg total) by mouth 2 (two) times daily. 03/31/22  Yes Glean Salvo, NP   Social History   Socioeconomic History   Marital status: Married    Spouse name: Not on file   Number  of children: 2   Years of education: some college   Highest education level: Some college, no degree  Occupational History   Occupation: Psychologist, sport and exercise at [employer]  Tobacco Use   Smoking status: Former    Packs/day: 0.50    Years: 8.00    Additional pack years: 0.00    Total pack years: 4.00    Types: Cigarettes    Quit date: 10/2020    Years since quitting: 1.8   Smokeless tobacco: Never  Vaping Use   Vaping Use: Never used  Substance and Sexual Activity   Alcohol use: No   Drug use: No   Sexual activity: Yes    Partners: Male    Birth control/protection: None   Other Topics Concern   Not on file  Social History Narrative   Lives at home with family.   Right-handed.   Caffeine use: 6-7 cups per day.   Social Determinants of Health   Financial Resource Strain: Medium Risk (06/08/2022)   Overall Financial Resource Strain (CARDIA)    Difficulty of Paying Living Expenses: Somewhat hard  Food Insecurity: Patient Declined (06/08/2022)   Hunger Vital Sign    Worried About Running Out of Food in the Last Year: Patient declined    Ran Out of Food in the Last Year: Patient declined  Transportation Needs: No Transportation Needs (06/08/2022)   PRAPARE - Administrator, Civil Service (Medical): No    Lack of Transportation (Non-Medical): No  Physical Activity: Unknown (06/08/2022)   Exercise Vital Sign    Days of Exercise per Week: 0 days    Minutes of Exercise per Session: Not on file  Stress: No Stress Concern Present (06/08/2022)   [employer] of Occupational Health - Occupational Stress Questionnaire    Feeling of Stress : Not at all  Social Connections: Socially Integrated (06/08/2022)   Social Connection and Isolation Panel [NHANES]    Frequency of Communication with Friends and Family: More than three times a week    Frequency of Social Gatherings with Friends and Family: More than three times a week    Attends Religious Services: More than 4 times per year    Active Member of [employer] or Organizations: Yes    Attends Engineer, structural: More than 4 times per year    Marital Status: Married  Catering manager Violence: Not on file    Review of Systems   Objective:   Vitals:   08/22/22 1500  BP: 128/74  Pulse: 77  Temp: 97.8 F (36.6 C)  TempSrc: Temporal  SpO2: 99%  Weight: 189 lb 9.6 oz (86 kg)  Height: 5\' 7"  (1.702 m)     Physical Exam Constitutional:      General: She is not in acute distress.    Appearance: Normal appearance. She is well-developed.  HENT:     Head: Normocephalic and atraumatic.   Cardiovascular:     Rate and Rhythm: Normal rate.  Pulmonary:     Effort: Pulmonary effort is normal.  Musculoskeletal:     Comments: Right thigh, tender to palpation over distal medial thigh - over semimembranosus, semitendinosus muscle belly distally.  No appreciable defect, but guarded exam.   Slight soft tissue swelling without ecchymosis.  Min ttp over distal biceps femoris. Pain with resisted knee flexion, guarded with prone exam, and guarded with terminal extension of knee due to discomfort in affected area.  No focal bony tenderness of thigh.  Right knee, skin intact, no erythema,  no appreciable effusion, no joint line tenderness, patella nontender, patellar tendon intact and nontender.  Lower leg, gastroc without defect or focal tenderness, slight discomfort over gastrocnemius diffusely.  No ecchymosis or appreciable swelling.  Intact plantar strength, negative Thompson's.  Neurological:     Mental Status: She is alert and oriented to person, place, and time.  Psychiatric:        Mood and Affect: Mood normal.        Assessment & Plan:  Crystal Newman is a 32 y.o. female . Tear of right hamstring - Plan: Ambulatory referral to Sports Medicine, CANCELED: Ambulatory referral to Sports Medicine  Right leg pain Acute right thigh pain after performing a split with date of injury 08/20/2022.  Audible pop and location of pain suspicious for semitendinosis, semimembranosus, or less likely combination of tear of one of the structures along with biceps femoris.  No palpable defect but somewhat guarded exam.  Soft tissue swelling without appreciable ecchymosis at this time.  -Will refer to the [facility] for likely ultrasound versus consideration of MRI to determine degree of tear and affected areas, treatment/rehab plan from there.  Initially placed Ace bandage for compression, option of crutches but deferred initially.  Activity modification discussed.  -Short-term NSAIDs for 1  to 2 weeks if possible to lessen potential risk of impact on healing.   -Decreased motion for the initial 1 week may lessen scar formation, then likely rehab/stretching moving forward.  -Handout given with RTC precautions.  -  No orders of the defined types were placed in this encounter.  Patient Instructions  Unfortunately I do think you tore one of the hamstring muscles.  There are different levels of tears and that is difficult to tell that just on exam sometimes.  Ultrasound or MRI can be helpful on determining to what degree that muscle may be torn and then can also help determine treatment.  I will refer you to  sports medicine for further evaluation and likely ultrasound imaging.  They can also discuss if MRI is needed but likely will start with ultrasound.   For now okay to use Ace bandage for compression or an over-the-counter sleeve can also be helpful. Short-term over-the-counter ibuprofen or Aleve up to 1 week is okay for now if needed for pain.  Initially can restrict the amount of motion at the knee for the first week, then likely will start some stretches and range of motion.   Hang in there!  Hamstring Strain  A hamstring strain happens when the muscles in the back of the thighs (hamstring muscles) are overstretched or torn. The hamstring muscles are used in straightening the hips, bending the knees, and pulling back the legs. This injury is often called a pulled hamstring muscle. The tissue that connects the muscle to a bone (tendon) may also be affected. The severity of a hamstring strain may be rated in degrees or grades. First-degree (or grade 1) strains have the least amount of muscle tearing and pain. Second-degree and third-degree (grade 2 and 3) strains have increasingly more tearing and pain. What are the causes? This condition is caused by a sudden, violent force being placed on the hamstring muscles, stretching them too far. This often happens during activities  that involve sprinting, jumping, kicking, or weight lifting. What increases the risk? Hamstring strains are especially common in athletes. The following factors may also make you more likely to develop this condition: Having low strength, endurance, or flexibility of the hamstring muscles.  Doing high-impact physical activity or sports. Having poor physical fitness. Having a previous leg injury. Having tired (fatigued) muscles. Having a previous hamstring strain. What are the signs or symptoms? Symptoms of this condition include: Pain in the back of the thigh or buttocks. Swelling. Bruising. Muscle spasms. Trouble moving the affected muscle because of pain. For severe strains, you may feel popping or snapping in the back of your thigh when the injury occurs. How is this diagnosed? This condition is diagnosed based on your symptoms, your medical history, and a physical exam. You may also have imaging tests, such as an MRI or X-rays. Your strain may be rated based on how severe it is. The ratings are: Grade 1 strain (mild). Muscles are overstretched. There may be very small muscle tears. Grade 2 strain (moderate). Muscles are partially torn. Grade 3 strain (severe). Muscles are completely torn. How is this treated? Treatment for this condition usually involves: Protecting, resting, icing, applying compression, and elevating the injured area (PRICE therapy). Medicines. Your health care provider may recommend medicines to help reduce pain or inflammation. Doing exercises to regain strength and flexibility in the muscles. Your health care provider will tell you when it is okay to begin exercising. Hamstring strains may take a long time to heal. This type of strain may happen again in athletes. Follow your health care provider's advice about when to return to sports-related activities. Follow these instructions at home: PRICE therapy Use PRICE therapy to promote muscle healing during the  first 2-3 days after your injury, or as told by your health care provider. Protect the muscle from being injured again. Rest your injury. This usually involves limiting your normal activities and not using the injured hamstring muscle. Talk with your health care provider about how you should limit your activities. Apply ice to the injured area: Put ice in a plastic bag. Place a towel between your skin and the bag. Leave the ice on for 20 minutes, 2-3 times a day. After the third day, switch to applying heat as told. Put pressure (compression) on your injured hamstring by wrapping it with an elastic bandage. Be careful not to wrap it too tightly. That may interfere with blood circulation or may increase swelling. Raise (elevate) your injured hamstring above the level of your heart as often as possible. When you are lying down, you can do this by putting a pillow under your thigh.  Activity Begin exercising or stretching only as told by your health care provider. Do not return to full activity level until your health care provider approves. To help prevent muscle strains in the future, always warm up before exercising and stretch afterward. General instructions Take over-the-counter and prescription medicines only as told by your health care provider. If directed, apply heat to the affected area as often as told by your health care provider. Use the heat source that your health care provider recommends, such as a moist heat pack or a heating pad. Place a towel between your skin and the heat source. Leave the heat on for 20-30 minutes. Remove the heat if your skin turns bright red. This is especially important if you are unable to feel pain, heat, or cold. You may have a greater risk of getting burned. Keep all follow-up visits. This is important. Contact a health care provider if: You have increasing pain or swelling in the injured area. You have numbness, tingling, or a significant loss of  strength in the injured area. Get help right away  if: Your foot or your toes become cold or turn blue. Summary A hamstring strain happens when the muscles in the back of the thighs (hamstring muscles) are overstretched or torn. This injury can be caused by a sudden, violent force being placed on the hamstring muscles, causing them to stretch too far. Symptoms include pain, swelling, and muscle spasms in the injured area. Treatment includes PRICE therapy: protecting, resting, icing, applying compression, and elevating the injured area. This information is not intended to replace advice given to you by your health care provider. Make sure you discuss any questions you have with your health care provider. Document Revised: 07/23/2020 Document Reviewed: 07/23/2020 Elsevier Patient Education  2024 Elsevier Inc.     Signed,   Meredith Staggers, MD [facility], [facility] 08/23/22 9:41 AM

## 2022-08-23 ENCOUNTER — Encounter: Payer: Self-pay | Admitting: Family Medicine

## 2022-08-23 ENCOUNTER — Other Ambulatory Visit: Payer: Self-pay

## 2022-08-23 ENCOUNTER — Ambulatory Visit: Payer: BC Managed Care – PPO | Admitting: Family Medicine

## 2022-08-23 VITALS — BP 100/72 | HR 87 | Ht 67.0 in | Wt 187.4 lb

## 2022-08-23 DIAGNOSIS — M79604 Pain in right leg: Secondary | ICD-10-CM

## 2022-08-23 DIAGNOSIS — S76311A Strain of muscle, fascia and tendon of the posterior muscle group at thigh level, right thigh, initial encounter: Secondary | ICD-10-CM

## 2022-08-23 MED ORDER — MELOXICAM 15 MG PO TABS: ORAL_TABLET | ORAL | 3 refills | Status: DC

## 2022-08-23 NOTE — Patient Instructions (Addendum)
Thank you for coming in today.   The [place] L Protocol for Hamstring Strains  Leeroy Cha PT  I've referred you to Physical Therapy.  Let us know if you don't hear from them in one week.   I recommend you obtained a compression sleeve to help with your joint problems. There are many options on the market however I recommend obtaining a fill thigh Body Helix compression sleeve.  You can find information (including how to appropriate measure yourself for sizing) can be found at www.Body [employer].ch.  Many of these products are health savings account (HSA) eligible.   You can use the compression sleeve at any time throughout the day but is most important to use while being active as well as for 2 hours post-activity.   It is appropriate to ice following activity with the compression sleeve in place.   Let me know if this is not working.   Ok to use meoxicam with tylenol for pain as needed.

## 2022-09-13 DIAGNOSIS — R058 Other specified cough: Secondary | ICD-10-CM | POA: Diagnosis not present

## 2022-11-23 ENCOUNTER — Ambulatory Visit
Admission: EM | Admit: 2022-11-23 | Discharge: 2022-11-23 | Disposition: A | Discharge status: Home / Self Care | Payer: BC Managed Care – PPO | Attending: Physician Assistant | Admitting: Physician Assistant

## 2022-11-23 DIAGNOSIS — J069 Acute upper respiratory infection, unspecified: Secondary | ICD-10-CM | POA: Insufficient documentation

## 2022-11-23 DIAGNOSIS — Z1152 Encounter for screening for COVID-19: Secondary | ICD-10-CM | POA: Insufficient documentation

## 2022-11-23 NOTE — ED Provider Notes (Signed)
[facility]    CSN: [id number] Arrival date & time: 11/23/22  9518      History   Chief Complaint No chief complaint on file.   HPI BAYLEIGH LASPISA is a 32 y.o. female.   Patient here today for evaluation of cough, congestion, body aches that started 4 days ago.  DayQuil with some relief.  Home COVID test was negative.  She has not had any vomiting or diarrhea.  The history is provided by the patient.    Past Medical History:  Diagnosis Date   Anxiety    Asthma    sports induced   Gestational hypertension    Headache    Hidradenitis    HSV (herpes simplex virus) anogenital infection    Infection    UTI-frequent   Seizures (HCC)    unknown cause, last at age 64   Short PR-normal QRS complex syndrome    Syncope    neurally-mediated   Vitamin D deficiency     Patient Active Problem List   Diagnosis Date Noted   Abdominal pain 07/20/2022   Acute sciatica 07/20/2022   Irregular periods 07/20/2022   Low back pain 07/20/2022   Missed period 07/20/2022   Pruritus of vagina 07/20/2022   Acute vulvitis 07/20/2022   Paresthesia 04/15/2021   Chronic migraine w/o aura w/o status migrainosus, not intractable 04/15/2021   Alteration consciousness 04/15/2021   Pregnancy induced hypertension, third trimester 12/05/2016   Proteinuria 11/21/2016   Smoker 08/28/2016   Vitamin D deficiency 07/25/2016   History of 5 spontaneous abortions 07/22/2016   Herpes simplex 01/08/2015   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 Authorizing Provider  acetaminophen (TYLENOL) 500 MG tablet Take 1,000 mg by mouth every 6 (six) hours as needed for moderate pain or headache.    [provider]  levonorgestrel (LILETTA, 52 MG,) 20.1 MCG/DAY IUD IUD Liletta 20.4 mcg/24 hrs (8 yrs) 52 mg intrauterine device  Take 1 device by intrauterine route.    [provider]  meloxicam (MOBIC) 15 MG tablet One tab PO qAM with breakfast for 2 weeks, then daily prn pain. 08/23/22   Rodolph Bong, MD  SUMAtriptan (IMITREX) 50 MG tablet May repeat in 2 hours if headache persists or recurs. 03/31/22   Glean Salvo, NP  topiramate (TOPAMAX) 100 MG tablet Take 1 tablet (100 mg total) by mouth 2 (two) times daily. 03/31/22   Glean Salvo, NP    Family History Family History  Problem Relation Age of Onset   Hypertension Mother    Heart disease Mother        2 heart attack   Hyperlipidemia Mother  Hypertension Father    Diabetes Maternal Grandmother    Heart disease Maternal Grandmother    Hypertension Maternal Grandmother    Heart disease Maternal Grandfather    Heart attack Maternal Grandfather        multiple before age 39.   Diabetes Paternal Grandmother    Heart disease Paternal Grandmother    Cancer Paternal Grandfather        lung   Cancer Maternal Aunt        ovarian    Social History Social History   Tobacco Use   Smoking status: Former    Current packs/day: 0.00    Average packs/day: 0.5 packs/day for 8.0 years (4.0 ttl pk-yrs)    Types: Cigarettes    Start date: 10/2012    Quit date: 10/2020    Years since quitting: 2.1   Smokeless tobacco: Never  Vaping Use   Vaping status: Never Used  Substance Use Topics   Alcohol use: No   Drug use: No     Allergies   Patient has no known allergies.   Review of Systems Review of Systems  Constitutional:  Negative for chills and  fever.  HENT:  Positive for congestion and sore throat. Negative for ear pain.   Eyes:  Negative for discharge and redness.  Respiratory:  Positive for cough. Negative for shortness of breath and wheezing.   Gastrointestinal:  Negative for abdominal pain, diarrhea, nausea and vomiting.     Physical Exam Triage Vital Signs ED Triage Vitals  Encounter Vitals Group     BP      Systolic BP Percentile      Diastolic BP Percentile      Pulse      Resp      Temp      Temp src      SpO2      Weight      Height      Head Circumference      Peak Flow      Pain Score      Pain Loc      Pain Education      Exclude from Growth Chart    No data found.  Updated Vital Signs BP 123/80 (BP Location: Right Arm)   Pulse 90   Temp 98.3 F (36.8 C) (Oral)   Resp 16   SpO2 98%     Physical Exam Vitals and nursing note reviewed.  Constitutional:      General: She is not in acute distress.    Appearance: Normal appearance. She is not ill-appearing.  HENT:     Head: Normocephalic and atraumatic.     Nose: Congestion present.     Mouth/Throat:     Mouth: Mucous membranes are moist.     Pharynx: No oropharyngeal exudate or posterior oropharyngeal erythema.  Eyes:     Conjunctiva/sclera: Conjunctivae normal.  Cardiovascular:     Rate and Rhythm: Normal rate and regular rhythm.     Heart sounds: Normal heart sounds. No murmur heard. Pulmonary:     Effort: Pulmonary effort is normal. No respiratory distress.     Breath sounds: Normal breath sounds. No wheezing, rhonchi or rales.  Skin:    General: Skin is warm and dry.  Neurological:     Mental Status: She is alert.  Psychiatric:        Mood and Affect: Mood normal.        Thought Content: Thought content normal.  UC Treatments / Results  Labs (all labs ordered are listed, but only abnormal results are displayed) Labs Reviewed  SARS CORONAVIRUS 2 (TAT 6-24 HRS)    EKG   Radiology No results  found.  Procedures Procedures (including critical care time)  Medications Ordered in UC Medications - No data to display  Initial Impression / Assessment and Plan / UC Course  I have reviewed the triage vital signs and the nursing notes.  Pertinent labs & imaging results that were available during my care of the patient were reviewed by me and considered in my medical decision making (see chart for details).    Suspect viral etiology of symptoms.  Will order repeat COVID screening.  Encouraged symptomatic treatment, increase fluids and rest with follow-up if no gradual improvement or with any worsening symptoms.  Final Clinical Impressions(s) / UC Diagnoses   Final diagnoses:  Acute upper respiratory infection   Discharge Instructions   None    ED Prescriptions   None    PDMP not reviewed this encounter.   Tomi Bamberger, PA-C 11/24/22 [id number]

## 2022-11-23 NOTE — ED Triage Notes (Signed)
Pt presents to UC w/ c/o cough, chest congestion, body aches x4 days. Dayquil gives some relief. Home covid test negative

## 2022-11-24 ENCOUNTER — Encounter: Payer: Self-pay | Admitting: Physician Assistant

## 2022-11-24 LAB — SARS CORONAVIRUS 2 (TAT 6-24 HRS): SARS Coronavirus 2: NEGATIVE

## 2022-12-16 ENCOUNTER — Emergency Department (HOSPITAL_BASED_OUTPATIENT_CLINIC_OR_DEPARTMENT_OTHER): Payer: BC Managed Care – PPO

## 2022-12-16 ENCOUNTER — Encounter (HOSPITAL_BASED_OUTPATIENT_CLINIC_OR_DEPARTMENT_OTHER): Payer: Self-pay | Admitting: Pediatrics

## 2022-12-16 ENCOUNTER — Emergency Department (HOSPITAL_BASED_OUTPATIENT_CLINIC_OR_DEPARTMENT_OTHER)
Admission: EM | Admit: 2022-12-16 | Discharge: 2022-12-16 | Disposition: A | Discharge status: Home / Self Care | Payer: BC Managed Care – PPO | Attending: Emergency Medicine | Admitting: Emergency Medicine

## 2022-12-16 ENCOUNTER — Other Ambulatory Visit: Payer: Self-pay

## 2022-12-16 DIAGNOSIS — M549 Dorsalgia, unspecified: Secondary | ICD-10-CM | POA: Diagnosis not present

## 2022-12-16 DIAGNOSIS — J45909 Unspecified asthma, uncomplicated: Secondary | ICD-10-CM | POA: Diagnosis not present

## 2022-12-16 DIAGNOSIS — R519 Headache, unspecified: Secondary | ICD-10-CM | POA: Diagnosis not present

## 2022-12-16 DIAGNOSIS — M546 Pain in thoracic spine: Secondary | ICD-10-CM | POA: Diagnosis not present

## 2022-12-16 DIAGNOSIS — R9431 Abnormal electrocardiogram [ECG] [EKG]: Secondary | ICD-10-CM | POA: Diagnosis not present

## 2022-12-16 DIAGNOSIS — M545 Low back pain, unspecified: Secondary | ICD-10-CM | POA: Diagnosis not present

## 2022-12-16 DIAGNOSIS — S7012XA Contusion of left thigh, initial encounter: Secondary | ICD-10-CM | POA: Diagnosis not present

## 2022-12-16 DIAGNOSIS — M79605 Pain in left leg: Secondary | ICD-10-CM | POA: Insufficient documentation

## 2022-12-16 DIAGNOSIS — X58XXXA Exposure to other specified factors, initial encounter: Secondary | ICD-10-CM | POA: Diagnosis not present

## 2022-12-16 DIAGNOSIS — R0689 Other abnormalities of breathing: Secondary | ICD-10-CM | POA: Diagnosis not present

## 2022-12-16 DIAGNOSIS — M79602 Pain in left arm: Secondary | ICD-10-CM | POA: Diagnosis not present

## 2022-12-16 LAB — BASIC METABOLIC PANEL
Anion gap: 10 (ref 5–15)
BUN: 10 mg/dL (ref 6–20)
CO2: 27 mmol/L (ref 22–32)
Calcium: 8.7 mg/dL — ABNORMAL LOW (ref 8.9–10.3)
Chloride: 100 mmol/L (ref 98–111)
Creatinine, Ser: 0.84 mg/dL (ref 0.44–1.00)
GFR, Estimated: >60 mL/min (ref >60)
Glucose, Bld: 114 mg/dL — ABNORMAL HIGH (ref 70–99)
Potassium: 3.6 mmol/L (ref 3.5–5.1)
Sodium: 137 mmol/L (ref 135–145)

## 2022-12-16 LAB — HCG, SERUM, QUALITATIVE: Preg, Serum: NEGATIVE

## 2022-12-16 LAB — CBC WITH DIFFERENTIAL/PLATELET
Abs Immature Granulocytes: 0.02 10*3/uL (ref 0.00–0.07)
Basophils Absolute: 0 10*3/uL (ref 0.0–0.1)
Basophils Relative: 0 %
Eosinophils Absolute: 0.1 10*3/uL (ref 0.0–0.5)
Eosinophils Relative: 1 %
HCT: 38 % (ref 36.0–46.0)
Hemoglobin: 12.5 g/dL (ref 12.0–15.0)
Immature Granulocytes: 0 %
Lymphocytes Relative: 20 %
Lymphs Abs: 1.7 10*3/uL (ref 0.7–4.0)
MCH: 30.8 pg (ref 26.0–34.0)
MCHC: 32.9 g/dL (ref 30.0–36.0)
MCV: 93.6 fL (ref 80.0–100.0)
Monocytes Absolute: 0.5 10*3/uL (ref 0.1–1.0)
Monocytes Relative: 6 %
Neutro Abs: 6.1 10*3/uL (ref 1.7–7.7)
Neutrophils Relative %: 73 %
Platelets: 219 10*3/uL (ref 150–400)
RBC: 4.06 MIL/uL (ref 3.87–5.11)
RDW: 12.4 % (ref 11.5–15.5)
WBC: 8.4 10*3/uL (ref 4.0–10.5)
nRBC: 0 % (ref 0.0–0.2)

## 2022-12-16 LAB — TROPONIN I (HIGH SENSITIVITY): Troponin I (High Sensitivity): <2 ng/L (ref <18)

## 2022-12-16 LAB — D-DIMER, QUANTITATIVE: D-Dimer, Quant: 0.37 ug{FEU}/mL (ref 0.00–0.50)

## 2022-12-16 MED ORDER — DIPHENHYDRAMINE HCL 50 MG/ML IJ SOLN
12.5000 mg | Freq: Once | INTRAMUSCULAR | Status: AC
  Administered 2022-12-16: 12.5 mg via INTRAVENOUS
  Filled 2022-12-16: qty 1

## 2022-12-16 MED ORDER — PROCHLORPERAZINE EDISYLATE 10 MG/2ML IJ SOLN
10.0000 mg | Freq: Once | INTRAMUSCULAR | Status: AC
  Administered 2022-12-16: 10 mg via INTRAVENOUS
  Filled 2022-12-16: qty 2

## 2022-12-16 NOTE — ED Triage Notes (Signed)
C/O leg and back pain, started with the leg pain yesterday, worst with walking other activity.  Patient reported she also has difficulty with deep breathes. C/O headache on left temporal side. Denies any recent injury.

## 2022-12-16 NOTE — ED Provider Notes (Signed)
[facility] Provider Note   CSN: [id number] Arrival date & time: 12/16/22  1307     History  Chief Complaint  Patient presents with   Back Pain   Leg Pain    Crystal Newman is a 32 y.o. female with a past medical history significant for seizures, vitamin D deficiency, asthma, anxiety, and history of migraines who presents to the ED due to multiple complaints.  Patient states yesterday she developed LLE pain around her quadricepts.  She admits to 1 area that she could push and create pain.  She then notes she developed ecchymosis to area.  No known injury.  She then woke up this morning with left-sided thoracic back pain worse with deep inspiration throughout her posterior left ribs.  Patient notes she is concerned about a possible blood clot.  No history of blood clots.  No recent surgeries or recent long immobilizations, or lower extremity edema.  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 She notes she feels like she "can't take a deep breath".   Patient also admits to left sided headache. NO visual or speech changes. History of migraines, notes it feels sharper than her typical migraine. No recent head injury.   History obtained from patient and past medical records. No interpreter used during encounter.       Home Medications Prior to Admission medications   Medication Sig Start Date End Date Taking? Authorizing Provider  acetaminophen (TYLENOL) 500 MG tablet Take 1,000 mg by mouth every 6 (six) hours as needed for moderate pain or headache.    [provider]  levonorgestrel (LILETTA, 52 MG,) 20.1 MCG/DAY IUD IUD Liletta 20.4 mcg/24 hrs (8 yrs) 52 mg intrauterine device  Take 1 device by intrauterine route.    [provider]  meloxicam (MOBIC) 15 MG tablet One tab PO qAM with breakfast for 2 weeks, then daily prn pain. 08/23/22   Rodolph Bong, MD  SUMAtriptan (IMITREX) 50 MG tablet May repeat in 2 hours if headache  persists or recurs. 03/31/22   Glean Salvo, NP  topiramate (TOPAMAX) 100 MG tablet Take 1 tablet (100 mg total) by mouth 2 (two) times daily. 03/31/22   Glean Salvo, NP      Allergies    Patient has no known allergies.    Review of Systems   Review of Systems  Respiratory:  Positive for shortness of breath.   Cardiovascular:  Positive for chest pain.  Gastrointestinal:  Negative for abdominal pain.  Musculoskeletal:  Positive for arthralgias and myalgias.    Physical Exam Updated Vital Signs BP (!) 124/92 (BP Location: Left Arm)   Pulse (!) 101   Temp 98.7 F (37.1 C) (Oral)   Resp 17   Ht 5\' 6"  (1.676 m)   Wt 81.6 kg   SpO2 100%   BMI 29.05 kg/m  Physical Exam Vitals and nursing note reviewed.  Constitutional:      General: She is not in acute distress.    Appearance: She is not ill-appearing.  HENT:     Head: Normocephalic.  Eyes:     Pupils: Pupils are equal, round, and reactive to light.  Cardiovascular:     Rate and Rhythm: Normal rate and regular rhythm.     Pulses: Normal pulses.     Heart sounds: Normal heart sounds. No murmur heard.    No friction rub. No gallop.  Pulmonary:     Effort: Pulmonary effort is normal.  Breath sounds: Normal breath sounds.  Abdominal:     General: Abdomen is flat. There is no distension.     Palpations: Abdomen is soft.     Tenderness: There is no abdominal tenderness. There is no guarding or rebound.  Musculoskeletal:        General: Normal range of motion.     Cervical back: Neck supple.     Comments: No lower extremity edema  Skin:    General: Skin is warm and dry.     Comments: Small area of ecchymosis to left quadricep.  No tenderness.  Neurological:     General: No focal deficit present.     Mental Status: She is alert.  Psychiatric:        Mood and Affect: Mood normal.        Behavior: Behavior normal.     ED Results / Procedures / Treatments   Labs (all labs ordered are listed, but only abnormal  results are displayed) Labs Reviewed  BASIC METABOLIC PANEL - Abnormal; Notable for the following components:      Result Value   Glucose, Bld 114 (*)    Calcium 8.7 (*)    All other components within normal limits  D-DIMER, QUANTITATIVE  HCG, SERUM, QUALITATIVE  CBC WITH DIFFERENTIAL/PLATELET  TROPONIN I (HIGH SENSITIVITY)    EKG None  Radiology DG Chest Portable 1 View  Result Date: 12/16/2022 CLINICAL DATA:  Back pain.  Difficulty taking a deep breath. EXAM: PORTABLE CHEST 1 VIEW COMPARISON:  11/13/2020 FINDINGS: Unchanged cardiac silhouette and mediastinal contours given reduced lung volumes and AP technique. No focal airspace opacities. No pleural effusion or pneumothorax. No evidence of edema. No acute osseous abnormalities. IMPRESSION: No acute cardiopulmonary disease on this hypoventilated AP portable examination. Electronically Signed   By: Simonne Come M.D.   On: 12/16/2022 14:38   US Venous Img Lower  Left (DVT Study)  Result Date: 12/16/2022 CLINICAL DATA:  Left lower extremity pain.  Evaluate for DVT. EXAM: LEFT LOWER EXTREMITY VENOUS DOPPLER ULTRASOUND TECHNIQUE: Gray-scale sonography with graded compression, as well as color Doppler and duplex ultrasound were performed to evaluate the lower extremity deep venous systems from the level of the common femoral vein and including the common femoral, femoral, profunda femoral, popliteal and calf veins including the posterior tibial, peroneal and gastrocnemius veins when visible. The superficial great saphenous vein was also interrogated. Spectral Doppler was utilized to evaluate flow at rest and with distal augmentation maneuvers in the common femoral, femoral and popliteal veins. COMPARISON:  None Available. FINDINGS: Contralateral Common Femoral Vein: Respiratory phasicity is normal and symmetric with the symptomatic side. No evidence of thrombus. Normal compressibility. Common Femoral Vein: No evidence of thrombus. Normal  compressibility, respiratory phasicity and response to augmentation. Saphenofemoral Junction: No evidence of thrombus. Normal compressibility and flow on color Doppler imaging. Profunda Femoral Vein: No evidence of thrombus. Normal compressibility and flow on color Doppler imaging. Femoral Vein: No evidence of thrombus. Normal compressibility, respiratory phasicity and response to augmentation. Popliteal Vein: No evidence of thrombus. Normal compressibility, respiratory phasicity and response to augmentation. Calf Veins: No evidence of thrombus. Normal compressibility and flow on color Doppler imaging. Superficial Great Saphenous Vein: No evidence of thrombus. Normal compressibility. Other Findings:  None. IMPRESSION: No evidence of DVT within the left lower extremity. Electronically Signed   By: Simonne Come M.D.   On: 12/16/2022 14:37    Procedures Procedures    Medications Ordered in ED Medications  prochlorperazine (COMPAZINE) injection  10 mg (10 mg Intravenous Given 12/16/22 1404)  diphenhydrAMINE (BENADRYL) injection 12.5 mg (12.5 mg Intravenous Given 12/16/22 1404)    ED Course/ Medical Decision Making/ A&P Clinical Course as of 12/16/22 1505  Fri Dec 16, 2022  1454 D-Dimer, Quant: 0.37 [CA]  1454 Troponin I (High Sensitivity): <2 [CA]    Clinical Course User Index [CA] Mannie Stabile, PA-C                                 Medical Decision Making Amount and/or Complexity of Data Reviewed Labs: ordered. Decision-making details documented in ED Course. Radiology: ordered and independent interpretation performed. Decision-making details documented in ED Course. ECG/medicine tests: ordered and independent interpretation performed. Decision-making details documented in ED Course.  Risk Prescription drug management.   This patient presents to the ED for concern of left leg pain and CP, this involves an extensive number of treatment options, and is a complaint that carries with it  a high risk of complications and morbidity.  The differential diagnosis includes PE, DVT, ACS, aortic dissection, MSK etiology, etc  32 year old female presents to the ED due to left lower extremity pain and left posterior rib pain for the past 24 hours.  Pain worse with deep inspiration.  No history of blood clots, recent surgeries, or recent long immobilizations.  Has Liletta IUD for birth control. Patient also admits to left sided headache. History of migraines. Upon arrival patient mildly tachycardic at 101 with normal O2 saturation.  Patient in no acute distress.  No lower extremity edema.  Small area of ecchymosis to left quadricep.  Negative Homan sign bilaterally.  No rash to left posterior back to suggest shingles.  Routine labs ordered.  D-dimer to rule out PE.  Ultrasound rule out DVT.  Troponin rule out ACS.  EKG and chest x-ray ordered. Migraine cocktail for headache. Normal neurological exam.   CBC unremarkable.  No leukocytosis.  Normal hemoglobin.  D-dimer normal.  Low suspicion for PE.  Troponin normal.  Pregnancy test negative.  BMP reassuring.  Normal renal function.  No major electrolyte derangements.  Chest x-ray personally reviewed and interpreted which is negative for signs of pneumonia, pneumothorax or widened mediastinum. Agree with radiology. EKG NSR, no signs of acute ischemia. Low suspicion for PE.  3:03 PM Reassessed patient at bedside. Patient notes headache has resolved. No neurological deficits on exam. Low suspicion for CVA or emergent etiologies of headache, so will hold of on CT head at this time. Suspect headache related to history of migraines. Possible MSK etiology of upper back and leg pain.  Advised patient to take over-the-counter ibuprofen or Tylenol as needed for pain.  Presentation nonconcerning for aortic dissection.  Patient stable for discharge. Strict ED precautions discussed with patient. Patient states understanding and agrees to plan. Patient discharged home  in no acute distress and stable vitals  Lives at home  Has PCP       Final Clinical Impression(s) / ED Diagnoses Final diagnoses:  Left leg pain  Acute left-sided thoracic back pain  Acute nonintractable headache, unspecified headache type    Rx / DC Orders ED Discharge Orders     None         Jesusita Oka 12/16/22 1507    Alvira Monday, MD 12/16/22 2322

## 2022-12-16 NOTE — Discharge Instructions (Addendum)
It was a pleasure taking care of you today. As discussed, all of your labs were reassuring. No evidence of a blood clot on ultrasound. You may take over the counter ibuprofen or tylenol as needed for pain. Return to the ER for new or worsening symptoms.

## 2023-03-30 ENCOUNTER — Ambulatory Visit: Payer: BC Managed Care – PPO | Admitting: Neurology

## 2023-05-04 ENCOUNTER — Ambulatory Visit (INDEPENDENT_AMBULATORY_CARE_PROVIDER_SITE_OTHER): Payer: BC Managed Care – PPO | Admitting: Family Medicine

## 2023-05-04 ENCOUNTER — Encounter: Payer: Self-pay | Admitting: Family Medicine

## 2023-05-04 ENCOUNTER — Other Ambulatory Visit (HOSPITAL_COMMUNITY)
Admission: RE | Admit: 2023-05-04 | Discharge: 2023-05-04 | Disposition: A | Discharge status: Home / Self Care | Source: Ambulatory Visit | Attending: Family Medicine | Admitting: Family Medicine

## 2023-05-04 VITALS — BP 128/72 | HR 80 | Temp 97.9°F | Ht 66.25 in | Wt 194.2 lb

## 2023-05-04 DIAGNOSIS — Z124 Encounter for screening for malignant neoplasm of cervix: Secondary | ICD-10-CM | POA: Diagnosis not present

## 2023-05-04 DIAGNOSIS — Z1159 Encounter for screening for other viral diseases: Secondary | ICD-10-CM

## 2023-05-04 DIAGNOSIS — Z1329 Encounter for screening for other suspected endocrine disorder: Secondary | ICD-10-CM | POA: Diagnosis not present

## 2023-05-04 DIAGNOSIS — Z131 Encounter for screening for diabetes mellitus: Secondary | ICD-10-CM | POA: Diagnosis not present

## 2023-05-04 DIAGNOSIS — Z0000 Encounter for general adult medical examination without abnormal findings: Secondary | ICD-10-CM

## 2023-05-04 DIAGNOSIS — Z13220 Encounter for screening for lipoid disorders: Secondary | ICD-10-CM

## 2023-05-04 NOTE — Patient Instructions (Addendum)
 Thank you for coming in today.  If any concerns on labs I will let you know.  Pap testing also performed today and will let you know those results.  Follow-up with gynecology and dentist when possible.  Let me know if there are questions and take care!  Preventive Care 37-35 Years Old, Female Preventive care refers to lifestyle choices and visits with your health care provider that can promote health and wellness. Preventive care visits are also called wellness exams. What can I expect for my preventive care visit? Counseling During your preventive care visit, your health care provider may ask about your: Medical history, including: Past medical problems. Family medical history. Pregnancy history. Current health, including: Menstrual cycle. Method of birth control. Emotional well-being. Home life and relationship well-being. Sexual activity and sexual health. Lifestyle, including: Alcohol, nicotine or tobacco, and drug use. Access to firearms. Diet, exercise, and sleep habits. Work and work Astronomer. Sunscreen use. Safety issues such as seatbelt and bike helmet use. Physical exam Your health care provider may check your: Height and weight. These may be used to calculate your BMI (body mass index). BMI is a measurement that tells if you are at a healthy weight. Waist circumference. This measures the distance around your waistline. This measurement also tells if you are at a healthy weight and may help predict your risk of certain diseases, such as type 2 diabetes and high blood pressure. Heart rate and blood pressure. Body temperature. Skin for abnormal spots. What immunizations do I need?  Vaccines are usually given at various ages, according to a schedule. Your health care provider will recommend vaccines for you based on your age, medical history, and lifestyle or other factors, such as travel or where you work. What tests do I need? Screening Your health care provider may  recommend screening tests for certain conditions. This may include: Pelvic exam and Pap test. Lipid and cholesterol levels. Diabetes screening. This is done by checking your blood sugar (glucose) after you have not eaten for a while (fasting). Hepatitis B test. Hepatitis C test. HIV (human immunodeficiency virus) test. STI (sexually transmitted infection) testing, if you are at risk. BRCA-related cancer screening. This may be done if you have a family history of breast, ovarian, tubal, or peritoneal cancers. Talk with your health care provider about your test results, treatment options, and if necessary, the need for more tests. Follow these instructions at home: Eating and drinking  Eat a healthy diet that includes fresh fruits and vegetables, whole grains, lean protein, and low-fat dairy products. Take vitamin and mineral supplements as recommended by your health care provider. Do not drink alcohol if: Your health care provider tells you not to drink. You are pregnant, may be pregnant, or are planning to become pregnant. If you drink alcohol: Limit how much you have to 0-1 drink a day. Know how much alcohol is in your drink. In the [place] one drink equals one 12 oz bottle of beer (355 mL), one 5 oz glass of wine (148 mL), or one 1 oz glass of hard liquor (44 mL). Lifestyle Brush your teeth every morning and night with fluoride toothpaste. Floss one time each day. Exercise for at least 30 minutes 5 or more days each week. Do not use any products that contain nicotine or tobacco. These products include cigarettes, chewing tobacco, and vaping devices, such as e-cigarettes. If you need help quitting, ask your health care provider. Do not use drugs. If you are sexually active, practice safe  sex. Use a condom or other form of protection to prevent STIs. If you do not wish to become pregnant, use a form of birth control. If you plan to become pregnant, see your health care provider for a  prepregnancy visit. Find healthy ways to manage stress, such as: Meditation, yoga, or listening to music. Journaling. Talking to a trusted person. Spending time with friends and family. Minimize exposure to UV radiation to reduce your risk of skin cancer. Safety Always wear your seat belt while driving or riding in a vehicle. Do not drive: If you have been drinking alcohol. Do not ride with someone who has been drinking. If you have been using any mind-altering substances or drugs. While texting. When you are tired or distracted. Wear a helmet and other protective equipment during sports activities. If you have firearms in your house, make sure you follow all gun safety procedures. Seek help if you have been physically or sexually abused. What's next? Go to your health care provider once a year for an annual wellness visit. Ask your health care provider how often you should have your eyes and teeth checked. Stay up to date on all vaccines. This information is not intended to replace advice given to you by your health care provider. Make sure you discuss any questions you have with your health care provider. Document Revised: 08/19/2020 Document Reviewed: 08/19/2020 Elsevier Patient Education  2024 [employer].

## 2023-05-04 NOTE — Progress Notes (Signed)
 Subjective:  Patient ID: Crystal Newman, female    DOB: 01-12-1991  Age: 33 y.o. MRN: [id number]  CC:  Chief Complaint  Patient presents with   Annual Exam    Pt is doing well no concerns, pt is not fasting, needs pap    HPI Crystal Newman presents for Annual Exam, last physical in February 2024.  No chronic health changes  Staying busy. Kids are 8 and 6. Family doing well.   History of chronic migraine treated by neurology with Topamax, Imitrex. Stopped topiramate on own for awhile. Only 1 migraine, no significant change. Has imitrex as needed - only used once.   Gynecology - prior Dr. Su Hilt. Last pap 56yrs ago. Normal. Liletta IUD for contraception ( in place since ~2019, 8 year replacement interval). Plans to reestablish care with GYN, but pap here today.  Declines need for STI screening.     05/04/2023    3:37 PM 08/22/2022    3:01 PM 06/08/2022    2:56 PM 05/02/2022    4:06 PM 01/17/2022    2:17 PM  Depression screen PHQ 2/9  Decreased Interest 0 0 1 0 0  Down, Depressed, Hopeless 0 0 1 0 0  PHQ - 2 Score 0 0 2 0 0  Altered sleeping 1 0 2  0  Tired, decreased energy 1 1 2   0  Change in appetite 0 1 0  0  Feeling bad or failure about yourself  0 0 1  0  Trouble concentrating 1 0 0  0  Moving slowly or fidgety/restless 0 0 0  0  Suicidal thoughts 0 0 0  0  PHQ-9 Score 3 2 7   0  Difficult doing work/chores   Somewhat difficult      Health Maintenance  Topic Date Due   Hepatitis C Screening  Never done   Pneumococcal Vaccine 40-20 Years old (2 of 2 - PCV) 12/06/2017   Cervical Cancer Screening (HPV/Pap Cotest)  07/22/2019   COVID-19 Vaccine (1 - 2024-25 season) Never done   INFLUENZA VACCINE  06/06/2023 (Originally 10/06/2022)   DTaP/Tdap/Td (3 - Td or Tdap) 11/02/2026   HIV Screening  Completed   HPV VACCINES  Aged Out  Mammogram April 2024. Prior abscess.  Screening staring at age 61.  Followed by dermatology in [facility] [place] prior for hidradenitis. Episodic flare.     Immunization History  Administered Date(s) Administered   Influenza Split 12/09/2014   Influenza,inj,Quad PF,6+ Mos 12/06/2016, 05/02/2022   Influenza-Unspecified 12/05/2013   MMR 12/06/2016   Pneumococcal Polysaccharide-23 12/06/2016   Tdap 12/09/2014, 11/01/2016  Flu vaccine - today.  Covid vaccine - declines.  Hep c screen - today on labs.   No results found. Wears glasses - within past year, works at [employer].    Dental: plans on scheduling.   Alcohol:  none  Tobacco: none  Exercise: trying to increase in past month. 12# weight loss.  Cut back on sodas.  Wt Readings from Last 3 Encounters:  05/04/23 194 lb 3.2 oz (88.1 kg)  12/16/22 180 lb (81.6 kg)  08/23/22 187 lb 6.4 oz (85 kg)      History Patient Active Problem List   Diagnosis Date Noted   Abdominal pain 07/20/2022   Acute sciatica 07/20/2022   Irregular periods 07/20/2022   Low back pain 07/20/2022   Missed period 07/20/2022   Pruritus of vagina 07/20/2022   Acute vulvitis 07/20/2022   Paresthesia 04/15/2021   Chronic migraine  w/o aura w/o status migrainosus, not intractable 04/15/2021   Alteration consciousness 04/15/2021   Pregnancy induced hypertension, third trimester 12/05/2016   Proteinuria 11/21/2016   Smoker 08/28/2016   Vitamin D deficiency 07/25/2016   History of 5 spontaneous abortions 07/22/2016   Herpes simplex 01/08/2015   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   Rubella non-immune status, antepartum 11/18/2014   Previous recurrent miscarriages affecting pregnancy, antepartum 11/18/2014   Threatened miscarriage 06/05/2014   Hidradenitis 07/21/2011   Past Medical History:  Diagnosis Date   Anxiety    Asthma    sports induced   Gestational hypertension    Headache    Hidradenitis    HSV (herpes simplex virus) anogenital infection    Infection    UTI-frequent   Seizures (HCC)    unknown cause, last at age 1   Short PR-normal QRS complex syndrome    Syncope     neurally-mediated   Vitamin D deficiency    Past Surgical History:  Procedure Laterality Date   TONSILLECTOMY     WISDOM TOOTH EXTRACTION     No Known Allergies Prior to Admission medications   Medication Sig Start Date End Date Taking? Authorizing Provider  acetaminophen (TYLENOL) 500 MG tablet Take 1,000 mg by mouth every 6 (six) hours as needed for moderate pain or headache.   Yes [provider]  levonorgestrel (LILETTA, 52 MG,) 20.1 MCG/DAY IUD IUD Liletta 20.4 mcg/24 hrs (8 yrs) 52 mg intrauterine device  Take 1 device by intrauterine route.   Yes [provider]  SUMAtriptan (IMITREX) 50 MG tablet May repeat in 2 hours if headache persists or recurs. 03/31/22  Yes Glean Salvo, NP  meloxicam (MOBIC) 15 MG tablet One tab PO qAM with breakfast for 2 weeks, then daily prn pain. Patient not taking: Reported on 05/04/2023 08/23/22   Rodolph Bong, MD  topiramate (TOPAMAX) 100 MG tablet Take 1 tablet (100 mg total) by mouth 2 (two) times daily. Patient not taking: Reported on 05/04/2023 03/31/22   Glean Salvo, NP   Social History   Socioeconomic History   Marital status: Married    Spouse name: Not on file   Number of children: 2   Years of education: some college   Highest education level: Some college, no degree  Occupational History   Occupation: front desk at [employer]  Tobacco Use   Smoking status: Former    Current packs/day: 0.00    Average packs/day: 0.5 packs/day for 8.0 years (4.0 ttl pk-yrs)    Types: Cigarettes    Start date: 10/2012    Quit date: 10/2020    Years since quitting: 2.5   Smokeless tobacco: Never  Vaping Use   Vaping status: Never Used  Substance and Sexual Activity   Alcohol use: No   Drug use: No   Sexual activity: Yes    Partners: Male    Birth control/protection: None  Other Topics Concern   Not on file  Social History Narrative   Lives at home with family.   Right-handed.   Caffeine use: 6-7 cups per day.   Social  Drivers of Health   Financial Resource Strain: Medium Risk (06/08/2022)   Overall Financial Resource Strain (CARDIA)    Difficulty of Paying Living Expenses: Somewhat hard  Food Insecurity: Patient Declined (06/08/2022)   Hunger Vital Sign    Worried About Running Out of Food in the Last Year: Patient declined    Ran Out of Food in the Last Year: Patient  declined  Transportation Needs: No Transportation Needs (06/08/2022)   PRAPARE - Administrator, Civil Service (Medical): No    Lack of Transportation (Non-Medical): No  Physical Activity: Unknown (06/08/2022)   Exercise Vital Sign    Days of Exercise per Week: 0 days    Minutes of Exercise per Session: Not on file  Stress: No Stress Concern Present (06/08/2022)   [employer] of Occupational Health - Occupational Stress Questionnaire    Feeling of Stress : Not at all  Social Connections: Socially Integrated (06/08/2022)   Social Connection and Isolation Panel [NHANES]    Frequency of Communication with Friends and Family: More than three times a week    Frequency of Social Gatherings with Friends and Family: More than three times a week    Attends Religious Services: More than 4 times per year    Active Member of [employer] or Organizations: Yes    Attends Engineer, structural: More than 4 times per year    Marital Status: Married  Catering manager Violence: Not on file    Review of Systems   Objective:   Vitals:   05/04/23 1544  BP: 128/72  Pulse: 80  Temp: 97.9 F (36.6 C)  TempSrc: Temporal  SpO2: 97%  Weight: 194 lb 3.2 oz (88.1 kg)  Height: 5' 6.25" (1.683 m)     Physical Exam Exam conducted with a chaperone present Thea Silversmith).  Constitutional:      Appearance: She is well-developed.  HENT:     Head: Normocephalic and atraumatic.     Right Ear: External ear normal.     Left Ear: External ear normal.  Eyes:     Conjunctiva/sclera: Conjunctivae normal.     Pupils: Pupils are equal, round, and  reactive to light.  Neck:     Thyroid: No thyromegaly.  Cardiovascular:     Rate and Rhythm: Normal rate and regular rhythm.     Heart sounds: Normal heart sounds. No murmur heard. Pulmonary:     Effort: Pulmonary effort is normal. No respiratory distress.     Breath sounds: Normal breath sounds. No wheezing.  Abdominal:     General: Bowel sounds are normal.     Palpations: Abdomen is soft.     Tenderness: There is no abdominal tenderness.  Genitourinary:    Pubic Area: No rash.      Labia:        Right: No rash or lesion.        Left: No rash or lesion.      Vagina: Vaginal discharge (Minimal, removed with fox swab prior to Pap testing) present. No erythema or bleeding.     Cervix: Normal. No friability or cervical bleeding.     Uterus: Normal.      Adnexa:        Right: No tenderness.         Left: No tenderness.    Musculoskeletal:        General: No tenderness. Normal range of motion.     Cervical back: Normal range of motion and neck supple.  Lymphadenopathy:     Cervical: No cervical adenopathy.  Skin:    General: Skin is warm and dry.     Findings: No rash.  Neurological:     Mental Status: She is alert and oriented to person, place, and time.  Psychiatric:        Behavior: Behavior normal.        Thought Content: Thought content  normal.        Assessment & Plan:  Crystal Newman is a 33 y.o. female . Annual physical exam - Plan: Cytology - PAP( [facility])  - -anticipatory guidance as below in AVS, screening labs above. Health maintenance items as above in HPI discussed/recommended as applicable.   Screening for cervical cancer - Plan: Cytology - PAP( )  -Pap testing with HPV testing as above.  Plans to follow-up with gynecology as well.  Still within time interval for current IUD.  Screening for diabetes mellitus - Plan: Hemoglobin A1c  Screening for hyperlipidemia - Plan: Lipid panel, Comprehensive metabolic panel  Screening for thyroid  disorder - Plan: TSH  Need for hepatitis C screening test - Plan: Hepatitis C antibody   No orders of the defined types were placed in this encounter.  Patient Instructions  Thank you for coming in today.  If any concerns on labs I will let you know.  Pap testing also performed today and will let you know those results.  Follow-up with gynecology and dentist when possible.  Let me know if there are questions and take care!  Preventive Care 55-9 Years Old, Female Preventive care refers to lifestyle choices and visits with your health care provider that can promote health and wellness. Preventive care visits are also called wellness exams. What can I expect for my preventive care visit? Counseling During your preventive care visit, your health care provider may ask about your: Medical history, including: Past medical problems. Family medical history. Pregnancy history. Current health, including: Menstrual cycle. Method of birth control. Emotional well-being. Home life and relationship well-being. Sexual activity and sexual health. Lifestyle, including: Alcohol, nicotine or tobacco, and drug use. Access to firearms. Diet, exercise, and sleep habits. Work and work Astronomer. Sunscreen use. Safety issues such as seatbelt and bike helmet use. Physical exam Your health care provider may check your: Height and weight. These may be used to calculate your BMI (body mass index). BMI is a measurement that tells if you are at a healthy weight. Waist circumference. This measures the distance around your waistline. This measurement also tells if you are at a healthy weight and may help predict your risk of certain diseases, such as type 2 diabetes and high blood pressure. Heart rate and blood pressure. Body temperature. Skin for abnormal spots. What immunizations do I need?  Vaccines are usually given at various ages, according to a schedule. Your health care provider will recommend  vaccines for you based on your age, medical history, and lifestyle or other factors, such as travel or where you work. What tests do I need? Screening Your health care provider may recommend screening tests for certain conditions. This may include: Pelvic exam and Pap test. Lipid and cholesterol levels. Diabetes screening. This is done by checking your blood sugar (glucose) after you have not eaten for a while (fasting). Hepatitis B test. Hepatitis C test. HIV (human immunodeficiency virus) test. STI (sexually transmitted infection) testing, if you are at risk. BRCA-related cancer screening. This may be done if you have a family history of breast, ovarian, tubal, or peritoneal cancers. Talk with your health care provider about your test results, treatment options, and if necessary, the need for more tests. Follow these instructions at home: Eating and drinking  Eat a healthy diet that includes fresh fruits and vegetables, whole grains, lean protein, and low-fat dairy products. Take vitamin and mineral supplements as recommended by your health care provider. Do not drink alcohol  if: Your health care provider tells you not to drink. You are pregnant, may be pregnant, or are planning to become pregnant. If you drink alcohol: Limit how much you have to 0-1 drink a day. Know how much alcohol is in your drink. In the [place] one drink equals one 12 oz bottle of beer (355 mL), one 5 oz glass of wine (148 mL), or one 1 oz glass of hard liquor (44 mL). Lifestyle Brush your teeth every morning and night with fluoride toothpaste. Floss one time each day. Exercise for at least 30 minutes 5 or more days each week. Do not use any products that contain nicotine or tobacco. These products include cigarettes, chewing tobacco, and vaping devices, such as e-cigarettes. If you need help quitting, ask your health care provider. Do not use drugs. If you are sexually active, practice safe sex. Use a condom or  other form of protection to prevent STIs. If you do not wish to become pregnant, use a form of birth control. If you plan to become pregnant, see your health care provider for a prepregnancy visit. Find healthy ways to manage stress, such as: Meditation, yoga, or listening to music. Journaling. Talking to a trusted person. Spending time with friends and family. Minimize exposure to UV radiation to reduce your risk of skin cancer. Safety Always wear your seat belt while driving or riding in a vehicle. Do not drive: If you have been drinking alcohol. Do not ride with someone who has been drinking. If you have been using any mind-altering substances or drugs. While texting. When you are tired or distracted. Wear a helmet and other protective equipment during sports activities. If you have firearms in your house, make sure you follow all gun safety procedures. Seek help if you have been physically or sexually abused. What's next? Go to your health care provider once a year for an annual wellness visit. Ask your health care provider how often you should have your eyes and teeth checked. Stay up to date on all vaccines. This information is not intended to replace advice given to you by your health care provider. Make sure you discuss any questions you have with your health care provider. Document Revised: 08/19/2020 Document Reviewed: 08/19/2020 Elsevier Patient Education  2024 Elsevier Inc.    Signed,   Meredith Staggers, MD [facility], [facility] 05/04/23 5:31 PM

## 2023-05-05 LAB — COMPREHENSIVE METABOLIC PANEL
ALT: 15 U/L (ref 0–35)
AST: 20 U/L (ref 0–37)
Albumin: 4.2 g/dL (ref 3.5–5.2)
Alkaline Phosphatase: 59 U/L (ref 39–117)
BUN: 16 mg/dL (ref 6–23)
CO2: 30 meq/L (ref 19–32)
Calcium: 9 mg/dL (ref 8.4–10.5)
Chloride: 103 meq/L (ref 96–112)
Creatinine, Ser: 0.81 mg/dL (ref 0.40–1.20)
GFR: 94.23 mL/min (ref >60.00)
Glucose, Bld: 81 mg/dL (ref 70–99)
Potassium: 4.5 meq/L (ref 3.5–5.1)
Sodium: 138 meq/L (ref 135–145)
Total Bilirubin: 1.1 mg/dL (ref 0.2–1.2)
Total Protein: 7.1 g/dL (ref 6.0–8.3)

## 2023-05-05 LAB — LIPID PANEL
Cholesterol: 160 mg/dL (ref 0–200)
HDL: 48.5 mg/dL (ref >39.00)
LDL Cholesterol: 81 mg/dL (ref 0–99)
NonHDL: 111.29
Total CHOL/HDL Ratio: 3
Triglycerides: 151 mg/dL — ABNORMAL HIGH (ref 0.0–149.0)
VLDL: 30.2 mg/dL (ref 0.0–40.0)

## 2023-05-05 LAB — HEMOGLOBIN A1C: Hgb A1c MFr Bld: 5.2 % (ref 4.6–6.5)

## 2023-05-05 LAB — HEPATITIS C ANTIBODY: Hepatitis C Ab: NONREACTIVE

## 2023-05-05 LAB — TSH: TSH: 0.8 u[IU]/mL (ref 0.35–5.50)

## 2023-05-09 LAB — CYTOLOGY - PAP
Comment: NEGATIVE
Diagnosis: NEGATIVE
High risk HPV: NEGATIVE

## 2023-05-10 ENCOUNTER — Encounter: Payer: Self-pay | Admitting: Family Medicine

## 2023-05-10 DIAGNOSIS — L732 Hidradenitis suppurativa: Secondary | ICD-10-CM

## 2023-05-11 MED ORDER — DOXYCYCLINE HYCLATE 100 MG PO TABS
100.0000 mg | ORAL_TABLET | Freq: Two times a day (BID) | ORAL | 0 refills | Status: AC
Start: 2023-05-11 — End: ?

## 2023-05-11 NOTE — Telephone Encounter (Signed)
 Patient notes flare of Hidradenitis and notes there is a area that is red and it is open at this time, notes severe pain in this area. Notes she is worried about infection and is asking if she should take course of Doxycycline? This was not addressed last OV but has been a chronic issue for this patient.    Please advise

## 2023-05-11 NOTE — Telephone Encounter (Signed)
 Reply sent to patient, will send in doxycycline with RTC precautions, urgent care precautions given

## 2024-05-06 ENCOUNTER — Encounter: Payer: BC Managed Care – PPO | Admitting: Family Medicine
